# Patient Record
Sex: Male | Born: 1950
Health system: Southern US, Community
[De-identification: ages and names within clinical notes are randomized; demographics above are authoritative.]

## PROBLEM LIST (undated history)

## (undated) DIAGNOSIS — I10 Essential (primary) hypertension: Secondary | ICD-10-CM

## (undated) DIAGNOSIS — H269 Unspecified cataract: Secondary | ICD-10-CM

## (undated) DIAGNOSIS — M205X2 Other deformities of toe(s) (acquired), left foot: Secondary | ICD-10-CM

## (undated) DIAGNOSIS — I4510 Unspecified right bundle-branch block: Secondary | ICD-10-CM

## (undated) DIAGNOSIS — M14672 Charcot's joint, left ankle and foot: Secondary | ICD-10-CM

## (undated) DIAGNOSIS — M12579 Traumatic arthropathy, unspecified ankle and foot: Secondary | ICD-10-CM

## (undated) DIAGNOSIS — M14679 Charcot's joint, unspecified ankle and foot: Secondary | ICD-10-CM

## (undated) DIAGNOSIS — Z973 Presence of spectacles and contact lenses: Secondary | ICD-10-CM

## (undated) DIAGNOSIS — E119 Type 2 diabetes mellitus without complications: Secondary | ICD-10-CM

## (undated) HISTORY — PX: TOTAL ANKLE REPLACEMENT: SUR1218

## (undated) HISTORY — DX: Other deformities of toe(s) (acquired), left foot: M20.5X2

## (undated) HISTORY — DX: Essential (primary) hypertension: I10

## (undated) HISTORY — PX: TONSILLECTOMY: SUR1361

## (undated) HISTORY — DX: Type 2 diabetes mellitus without complications: E11.9

## (undated) HISTORY — DX: Traumatic arthropathy, unspecified ankle and foot: M12.579

## (undated) HISTORY — DX: Charcot's joint, unspecified ankle and foot: M14.679

## (undated) HISTORY — PX: COLONOSCOPY: SHX174

---
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
## 2009-05-28 ENCOUNTER — Ambulatory Visit: Payer: Self-pay | Admitting: Internal Medicine

## 2009-06-04 ENCOUNTER — Ambulatory Visit: Payer: Self-pay | Admitting: Internal Medicine

## 2009-06-11 ENCOUNTER — Ambulatory Visit: Payer: Self-pay | Admitting: Internal Medicine

## 2009-06-18 ENCOUNTER — Ambulatory Visit: Payer: Self-pay | Admitting: Internal Medicine

## 2009-06-25 ENCOUNTER — Ambulatory Visit: Payer: Self-pay | Admitting: Internal Medicine

## 2009-07-02 ENCOUNTER — Ambulatory Visit: Payer: Self-pay | Admitting: Internal Medicine

## 2014-12-17 DIAGNOSIS — Z0000 Encounter for general adult medical examination without abnormal findings: Secondary | ICD-10-CM | POA: Diagnosis not present

## 2014-12-17 DIAGNOSIS — Z23 Encounter for immunization: Secondary | ICD-10-CM | POA: Diagnosis not present

## 2014-12-17 DIAGNOSIS — I10 Essential (primary) hypertension: Secondary | ICD-10-CM | POA: Diagnosis not present

## 2014-12-17 DIAGNOSIS — Z125 Encounter for screening for malignant neoplasm of prostate: Secondary | ICD-10-CM | POA: Diagnosis not present

## 2014-12-17 DIAGNOSIS — E119 Type 2 diabetes mellitus without complications: Secondary | ICD-10-CM | POA: Diagnosis not present

## 2014-12-23 ENCOUNTER — Other Ambulatory Visit: Payer: Self-pay | Admitting: Family Medicine

## 2014-12-23 DIAGNOSIS — Z0000 Encounter for general adult medical examination without abnormal findings: Secondary | ICD-10-CM

## 2014-12-25 DIAGNOSIS — M19121 Post-traumatic osteoarthritis, right elbow: Secondary | ICD-10-CM | POA: Diagnosis not present

## 2014-12-25 DIAGNOSIS — G5621 Lesion of ulnar nerve, right upper limb: Secondary | ICD-10-CM | POA: Diagnosis not present

## 2014-12-26 ENCOUNTER — Ambulatory Visit: Payer: Self-pay

## 2015-01-15 ENCOUNTER — Ambulatory Visit
Admission: RE | Admit: 2015-01-15 | Discharge: 2015-01-15 | Disposition: A | Discharge status: Home / Self Care | Payer: Medicare Other | Source: Ambulatory Visit | Attending: Family Medicine | Admitting: Family Medicine

## 2015-01-15 DIAGNOSIS — Z0000 Encounter for general adult medical examination without abnormal findings: Secondary | ICD-10-CM

## 2015-01-15 DIAGNOSIS — Z1389 Encounter for screening for other disorder: Secondary | ICD-10-CM | POA: Insufficient documentation

## 2015-01-15 DIAGNOSIS — Z136 Encounter for screening for cardiovascular disorders: Secondary | ICD-10-CM | POA: Diagnosis not present

## 2015-01-17 DIAGNOSIS — D649 Anemia, unspecified: Secondary | ICD-10-CM | POA: Diagnosis not present

## 2015-01-17 DIAGNOSIS — E785 Hyperlipidemia, unspecified: Secondary | ICD-10-CM | POA: Diagnosis not present

## 2015-01-30 ENCOUNTER — Encounter (INDEPENDENT_AMBULATORY_CARE_PROVIDER_SITE_OTHER): Payer: Self-pay

## 2015-01-30 ENCOUNTER — Other Ambulatory Visit: Payer: Medicare Other

## 2015-01-30 DIAGNOSIS — D539 Nutritional anemia, unspecified: Secondary | ICD-10-CM | POA: Diagnosis not present

## 2015-01-30 DIAGNOSIS — D649 Anemia, unspecified: Secondary | ICD-10-CM

## 2015-01-31 LAB — CBC WITH DIFFERENTIAL/PLATELET
Basophils Absolute: 0 10*3/uL (ref 0.0–0.2)
Basos: 1 %
EOS (ABSOLUTE): 0.1 10*3/uL (ref 0.0–0.4)
EOS: 3 %
Hematocrit: 38.8 % (ref 37.5–51.0)
Hemoglobin: 12.7 g/dL (ref 12.6–17.7)
IMMATURE GRANS (ABS): 0 10*3/uL (ref 0.0–0.1)
Immature Granulocytes: 0 %
Lymphocytes Absolute: 1.4 10*3/uL (ref 0.7–3.1)
Lymphs: 26 %
MCH: 30.3 pg (ref 26.6–33.0)
MCHC: 32.7 g/dL (ref 31.5–35.7)
MCV: 93 fL (ref 79–97)
Monocytes Absolute: 0.5 10*3/uL (ref 0.1–0.9)
Monocytes: 10 %
Neutrophils Absolute: 3.3 10*3/uL (ref 1.4–7.0)
Neutrophils: 60 %
PLATELETS: 228 10*3/uL (ref 150–379)
RBC: 4.19 x10E6/uL (ref 4.14–5.80)
RDW: 13.8 % (ref 12.3–15.4)
WBC: 5.4 10*3/uL (ref 3.4–10.8)

## 2015-01-31 LAB — RETICULOCYTES: RETIC CT PCT: 1.7 % (ref 0.6–2.6)

## 2015-01-31 LAB — FERRITIN: FERRITIN: 98 ng/mL (ref 30–400)

## 2015-01-31 LAB — IRON AND TIBC
Iron Saturation: 19 % (ref 15–55)
Iron: 64 ug/dL (ref 38–169)
Total Iron Binding Capacity: 332 ug/dL (ref 250–450)
UIBC: 268 ug/dL (ref 111–343)

## 2015-02-05 ENCOUNTER — Telehealth: Payer: Self-pay

## 2015-02-05 NOTE — Telephone Encounter (Signed)
-----   Message from Steele Sizer, MD sent at 02/04/2015  9:05 AM EDT ----- Call pt about labs

## 2015-06-25 ENCOUNTER — Telehealth: Payer: Self-pay

## 2015-06-25 ENCOUNTER — Other Ambulatory Visit: Payer: Self-pay

## 2015-06-25 MED ORDER — LISINOPRIL 10 MG PO TABS: 40.0000 mg | ORAL_TABLET | Freq: Every day | ORAL | Status: DC

## 2015-06-25 MED ORDER — LISINOPRIL 10 MG PO TABS: 10.0000 mg | ORAL_TABLET | Freq: Every day | ORAL | Status: DC

## 2015-06-25 NOTE — Telephone Encounter (Signed)
LAST VISIT: 02/09/2015 (Lab Visit)  Request for lisinopril 10 mg tab.

## 2015-06-25 NOTE — Telephone Encounter (Signed)
Pt called to check status of refill request. Pt is out of the medication. Can this be sent today. Thanks.

## 2015-06-25 NOTE — Telephone Encounter (Signed)
Fax received from pharmacy for Rx Lisinopril 10mg . The directions state "take 4 tablets (40 mg total) by mouth daily"  Pharmacy notes "Please verify if this is correct. Patient has only been on 1 daily" So, pharmacy needs to know is he taking 1 tablet a day or 4?

## 2015-07-23 DIAGNOSIS — E119 Type 2 diabetes mellitus without complications: Secondary | ICD-10-CM | POA: Insufficient documentation

## 2015-07-23 DIAGNOSIS — I10 Essential (primary) hypertension: Secondary | ICD-10-CM | POA: Insufficient documentation

## 2015-07-28 ENCOUNTER — Ambulatory Visit (INDEPENDENT_AMBULATORY_CARE_PROVIDER_SITE_OTHER): Payer: Medicare Other | Admitting: Family Medicine

## 2015-07-28 ENCOUNTER — Encounter: Payer: Self-pay | Admitting: Family Medicine

## 2015-07-28 VITALS — BP 129/75 | HR 66 | Temp 97.9°F | Ht 74.2 in | Wt 269.0 lb

## 2015-07-28 DIAGNOSIS — D649 Anemia, unspecified: Secondary | ICD-10-CM | POA: Diagnosis not present

## 2015-07-28 DIAGNOSIS — Z23 Encounter for immunization: Secondary | ICD-10-CM | POA: Diagnosis not present

## 2015-07-28 DIAGNOSIS — I10 Essential (primary) hypertension: Secondary | ICD-10-CM | POA: Diagnosis not present

## 2015-07-28 DIAGNOSIS — E119 Type 2 diabetes mellitus without complications: Secondary | ICD-10-CM | POA: Diagnosis not present

## 2015-07-28 LAB — BAYER DCA HB A1C WAIVED: HB A1C: 6 % (ref <7.0)

## 2015-07-28 NOTE — Assessment & Plan Note (Signed)
Will do follow-up labs as recommended last spring

## 2015-07-28 NOTE — Assessment & Plan Note (Deleted)
Discussed better control needed Diet and exercise

## 2015-07-28 NOTE — Assessment & Plan Note (Signed)
The current medical regimen is effective;  continue present plan and medications.  

## 2015-07-28 NOTE — Progress Notes (Signed)
BP 129/75 mmHg  Pulse 66  Temp(Src) 97.9 F (36.6 C)  Ht 6' 2.2" (1.885 m)  Wt 269 lb (122.018 kg)  BMI 34.34 kg/m2  SpO2 96%   Subjective:    Patient ID: Daniel Leon, male    DOB: 07-Dec-1950, 64 y.o.   MRN: [id number]  HPI: Daniel Leon is a 64 y.o. male  Chief Complaint  Patient presents with  . Hypertension   Patient taking lisinopril 10 mg with no issues blood pressure good control Patient's lost some weight from last April On chart review CBC needs to be repeated Will check iron binding capacity and ferritin along with reticulocyte count  Relevant past medical, surgical, family and social history reviewed and updated as indicated. Interim medical history since our last visit reviewed. Allergies and medications reviewed and updated.  Review of Systems  Constitutional: Negative.   Respiratory: Negative.   Cardiovascular: Negative.     Per HPI unless specifically indicated above     Objective:    BP 129/75 mmHg  Pulse 66  Temp(Src) 97.9 F (36.6 C)  Ht 6' 2.2" (1.885 m)  Wt 269 lb (122.018 kg)  BMI 34.34 kg/m2  SpO2 96%  Wt Readings from Last 3 Encounters:  07/28/15 269 lb (122.018 kg)  12/17/14 274 lb (124.286 kg)    Physical Exam  Constitutional: He is oriented to person, place, and time. He appears well-developed and well-nourished. No distress.  HENT:  Head: Normocephalic and atraumatic.  Right Ear: Hearing normal.  Left Ear: Hearing normal.  Nose: Nose normal.  Eyes: Conjunctivae and lids are normal. Right eye exhibits no discharge. Left eye exhibits no discharge. No scleral icterus.  Pulmonary/Chest: Effort normal. No respiratory distress.  Musculoskeletal: Normal range of motion.  Neurological: He is alert and oriented to person, place, and time.  Skin: Skin is intact. No rash noted.  Psychiatric: He has a normal mood and affect. His speech is normal and behavior is normal. Judgment and thought content normal. Cognition and memory  are normal.    Results for orders placed or performed in visit on 01/30/15  CBC with Differential/Platelet  Result Value Ref Range   WBC 5.4 3.4 - 10.8 x10E3/uL   RBC 4.19 4.14 - 5.80 x10E6/uL   Hemoglobin 12.7 12.6 - 17.7 g/dL   Hematocrit 40.9 81.1 - 51.0 %   MCV 93 79 - 97 fL   MCH 30.3 26.6 - 33.0 pg   MCHC 32.7 31.5 - 35.7 g/dL   RDW 91.4 78.2 - 95.6 %   Platelets 228 150 - 379 x10E3/uL   Neutrophils 60 %   Lymphs 26 %   Monocytes 10 %   Eos 3 %   Basos 1 %   Neutrophils Absolute 3.3 1.4 - 7.0 x10E3/uL   Lymphocytes Absolute 1.4 0.7 - 3.1 x10E3/uL   Monocytes Absolute 0.5 0.1 - 0.9 x10E3/uL   EOS (ABSOLUTE) 0.1 0.0 - 0.4 x10E3/uL   Basophils Absolute 0.0 0.0 - 0.2 x10E3/uL   Immature Granulocytes 0 %   Immature Grans (Abs) 0.0 0.0 - 0.1 x10E3/uL  Ferritin  Result Value Ref Range   Ferritin 98 30 - 400 ng/mL  Iron Binding Cap (TIBC)  Result Value Ref Range   Total Iron Binding Capacity 332 250 - 450 ug/dL   UIBC 213 086 - 578 ug/dL   Iron 64 38 - 469 ug/dL   Iron Saturation 19 15 - 55 %  Retic  Result Value Ref Range  Retic Ct Pct 1.7 0.6 - 2.6 %      Assessment & Plan:   Problem List Items Addressed This Visit      Cardiovascular and Mediastinum   Essential hypertension    The current medical regimen is effective;  continue present plan and medications.       Relevant Orders   Basic metabolic panel     Endocrine   Diabetes mellitus without complication (HCC)    Diet controlled no symptoms      Relevant Orders   Bayer DCA Hb A1c Waived   Basic metabolic panel     Other   Anemia    Will do follow-up labs as recommended last spring      Relevant Orders   CBC with Differential/Platelet   Ferritin   Reticulocytes   Iron and TIBC    Other Visit Diagnoses    Immunization due    -  Primary    Relevant Orders    Flu Vaccine QUAD 36+ mos PF IM (Fluarix & Fluzone Quad PF) (Completed)        Follow up plan: Return in about 3 months (around  10/26/2015) for Physical Exam   April A1C.

## 2015-07-28 NOTE — Assessment & Plan Note (Signed)
Diet controlled- no symptoms  

## 2015-07-29 LAB — FERRITIN: Ferritin: 78 ng/mL (ref 30–400)

## 2015-07-29 LAB — BASIC METABOLIC PANEL
BUN/Creatinine Ratio: 20 (ref 10–22)
BUN: 22 mg/dL (ref 8–27)
CO2: 25 mmol/L (ref 18–29)
CREATININE: 1.08 mg/dL (ref 0.76–1.27)
Calcium: 9.4 mg/dL (ref 8.6–10.2)
Chloride: 99 mmol/L (ref 97–106)
GFR calc Af Amer: 83 mL/min/{1.73_m2} (ref >59)
GFR, EST NON AFRICAN AMERICAN: 72 mL/min/{1.73_m2} (ref >59)
Glucose: 95 mg/dL (ref 65–99)
Potassium: 5.1 mmol/L (ref 3.5–5.2)
SODIUM: 139 mmol/L (ref 136–144)

## 2015-07-29 LAB — CBC WITH DIFFERENTIAL/PLATELET
BASOS: 1 %
Basophils Absolute: 0.1 10*3/uL (ref 0.0–0.2)
EOS (ABSOLUTE): 0.2 10*3/uL (ref 0.0–0.4)
EOS: 3 %
HEMATOCRIT: 37.4 % — AB (ref 37.5–51.0)
HEMOGLOBIN: 12.9 g/dL (ref 12.6–17.7)
Immature Grans (Abs): 0 10*3/uL (ref 0.0–0.1)
Immature Granulocytes: 1 %
LYMPHS ABS: 1.7 10*3/uL (ref 0.7–3.1)
Lymphs: 26 %
MCH: 30.7 pg (ref 26.6–33.0)
MCHC: 34.5 g/dL (ref 31.5–35.7)
MCV: 89 fL (ref 79–97)
MONOCYTES: 11 %
Monocytes Absolute: 0.7 10*3/uL (ref 0.1–0.9)
NEUTROS ABS: 3.8 10*3/uL (ref 1.4–7.0)
Neutrophils: 58 %
Platelets: 298 10*3/uL (ref 150–379)
RBC: 4.2 x10E6/uL (ref 4.14–5.80)
RDW: 13.6 % (ref 12.3–15.4)
WBC: 6.6 10*3/uL (ref 3.4–10.8)

## 2015-07-29 LAB — RETICULOCYTES: RETIC CT PCT: 1.8 % (ref 0.6–2.6)

## 2015-07-29 LAB — IRON AND TIBC
IRON SATURATION: 20 % (ref 15–55)
IRON: 68 ug/dL (ref 38–169)
TIBC: 345 ug/dL (ref 250–450)
UIBC: 277 ug/dL (ref 111–343)

## 2015-08-29 ENCOUNTER — Other Ambulatory Visit: Payer: BC Managed Care – PPO

## 2015-08-29 ENCOUNTER — Encounter: Payer: Self-pay | Admitting: *Deleted

## 2015-08-29 NOTE — Patient Instructions (Signed)
  Your procedure is scheduled on: 09-05-15 (FRIDAY) Report to [facility] 2ND FLOOR To find out your arrival time please call [phone] between 1PM - 3PM on 09-04-15 (THURSDAY)  Remember: Instructions that are not followed completely may result in serious medical risk, up to and including death, or upon the discretion of your surgeon and anesthesiologist your surgery may need to be rescheduled.    _X___ 1. Do not eat food or drink liquids after midnight. No gum chewing or hard candies.     _X___ 2. No Alcohol for 24 hours before or after surgery.   ____ 3. Bring all medications with you on the day of surgery if instructed.    _X___ 4. Notify your doctor if there is any change in your medical condition     (cold, fever, infections).     Do not wear jewelry, make-up, hairpins, clips or nail polish.  Do not wear lotions, powders, or perfumes. You may wear deodorant.  Do not shave 48 hours prior to surgery. Men may shave face and neck.  Do not bring valuables to the hospital.    [facility] is not responsible for any belongings or valuables.               Contacts, dentures or bridgework may not be worn into surgery.  Leave your suitcase in the car. After surgery it may be brought to your room.  For patients admitted to the hospital, discharge time is determined by your treatment team.   Patients discharged the day of surgery will not be allowed to drive home.   Please read over the following fact sheets that you were given:    _X___ Take these medicines the morning of surgery with A SIP OF WATER:    1. LISINOPRIL  2.   3.   4.  5.  6.  ____ Fleet Enema (as directed)   _X___ Use CHG Soap as directed  ____ Use inhalers on the day of surgery  ____ Stop metformin 2 days prior to surgery    ____ Take 1/2 of usual insulin dose the night before surgery and none on the morning of surgery.   ____ Stop Coumadin/Plavix/aspirin-N/A  ____ Stop  Anti-inflammatories-NO NSAIDS OR ASPIRIN PRODUCTS-TYLENOL OK   ____ Stop supplements until after surgery.    ____ Bring C-Pap to the hospital.

## 2015-09-02 ENCOUNTER — Other Ambulatory Visit: Payer: Self-pay

## 2015-09-02 ENCOUNTER — Encounter
Admission: RE | Admit: 2015-09-02 | Discharge: 2015-09-02 | Disposition: A | Discharge status: Home / Self Care | Payer: Medicare Other | Source: Ambulatory Visit | Attending: Specialist | Admitting: Specialist

## 2015-09-02 DIAGNOSIS — G5621 Lesion of ulnar nerve, right upper limb: Secondary | ICD-10-CM | POA: Diagnosis not present

## 2015-09-04 ENCOUNTER — Encounter: Payer: Self-pay | Admitting: *Deleted

## 2015-09-04 MED ORDER — NEOMYCIN-POLYMYXIN B GU 40-200000 IR SOLN
Status: AC
  Filled 2015-09-04: qty 2

## 2015-09-04 MED ORDER — BUPIVACAINE-EPINEPHRINE (PF) 0.25% -1:200000 IJ SOLN
INTRAMUSCULAR | Status: AC
  Filled 2015-09-04: qty 30

## 2015-09-05 ENCOUNTER — Ambulatory Visit: Payer: Medicare Other | Admitting: Anesthesiology

## 2015-09-05 ENCOUNTER — Encounter
Admission: RE | Disposition: A | Discharge status: Home / Self Care | Payer: Self-pay | Source: Ambulatory Visit | Attending: Specialist

## 2015-09-05 ENCOUNTER — Ambulatory Visit
Admission: RE | Admit: 2015-09-05 | Discharge: 2015-09-05 | Disposition: A | Discharge status: Home / Self Care | Payer: Medicare Other | Source: Ambulatory Visit | Attending: Specialist | Admitting: Specialist

## 2015-09-05 ENCOUNTER — Encounter: Payer: Self-pay | Admitting: *Deleted

## 2015-09-05 DIAGNOSIS — E119 Type 2 diabetes mellitus without complications: Secondary | ICD-10-CM | POA: Insufficient documentation

## 2015-09-05 DIAGNOSIS — I10 Essential (primary) hypertension: Secondary | ICD-10-CM | POA: Insufficient documentation

## 2015-09-05 DIAGNOSIS — Z87891 Personal history of nicotine dependence: Secondary | ICD-10-CM | POA: Insufficient documentation

## 2015-09-05 DIAGNOSIS — G5621 Lesion of ulnar nerve, right upper limb: Secondary | ICD-10-CM | POA: Insufficient documentation

## 2015-09-05 HISTORY — DX: Unspecified right bundle-branch block: I45.10

## 2015-09-05 HISTORY — PX: ULNAR NERVE TRANSPOSITION: SHX2595

## 2015-09-05 LAB — GLUCOSE, CAPILLARY
GLUCOSE-CAPILLARY: 116 mg/dL — AB (ref 65–99)
Glucose-Capillary: 103 mg/dL — ABNORMAL HIGH (ref 65–99)

## 2015-09-05 SURGERY — ULNAR NERVE DECOMPRESSION/TRANSPOSITION
Anesthesia: General | Site: Elbow | Laterality: Right | Wound class: Clean

## 2015-09-05 MED ORDER — KETOROLAC TROMETHAMINE 30 MG/ML IJ SOLN
INTRAMUSCULAR | Status: DC | PRN
  Administered 2015-09-05: 30 mg via INTRAVENOUS

## 2015-09-05 MED ORDER — FENTANYL CITRATE (PF) 100 MCG/2ML IJ SOLN
INTRAMUSCULAR | Status: DC | PRN
  Administered 2015-09-05 (×2): 50 ug via INTRAVENOUS

## 2015-09-05 MED ORDER — EPHEDRINE SULFATE 50 MG/ML IJ SOLN
INTRAMUSCULAR | Status: DC | PRN
  Administered 2015-09-05: 10 mg via INTRAVENOUS

## 2015-09-05 MED ORDER — CEFAZOLIN SODIUM-DEXTROSE 2-3 GM-% IV SOLR
2.0000 g | Freq: Once | INTRAVENOUS | Status: AC
  Administered 2015-09-05: 2 g via INTRAVENOUS

## 2015-09-05 MED ORDER — CEFAZOLIN SODIUM-DEXTROSE 2-3 GM-% IV SOLR
INTRAVENOUS | Status: AC
  Filled 2015-09-05: qty 50

## 2015-09-05 MED ORDER — BUPIVACAINE HCL (PF) 0.5 % IJ SOLN
INTRAMUSCULAR | Status: AC
  Filled 2015-09-05: qty 30

## 2015-09-05 MED ORDER — ACETAMINOPHEN 10 MG/ML IV SOLN
INTRAVENOUS | Status: AC
  Filled 2015-09-05: qty 100

## 2015-09-05 MED ORDER — ONDANSETRON HCL 4 MG/2ML IJ SOLN: 4.0000 mg | Freq: Once | INTRAMUSCULAR | Status: DC | PRN

## 2015-09-05 MED ORDER — LIDOCAINE HCL (CARDIAC) 20 MG/ML IV SOLN
INTRAVENOUS | Status: DC | PRN
  Administered 2015-09-05: 100 mg via INTRAVENOUS

## 2015-09-05 MED ORDER — BUPIVACAINE HCL (PF) 0.5 % IJ SOLN
INTRAMUSCULAR | Status: DC | PRN
  Administered 2015-09-05: 10 mL

## 2015-09-05 MED ORDER — ACETAMINOPHEN 10 MG/ML IV SOLN
INTRAVENOUS | Status: DC | PRN
  Administered 2015-09-05: 300 mg via INTRAVENOUS
  Administered 2015-09-05: 1000 mg via INTRAVENOUS

## 2015-09-05 MED ORDER — FAMOTIDINE 20 MG PO TABS
20.0000 mg | ORAL_TABLET | Freq: Once | ORAL | Status: AC
  Administered 2015-09-05: 20 mg via ORAL

## 2015-09-05 MED ORDER — HYDROCODONE-ACETAMINOPHEN 7.5-325 MG PO TABS
ORAL_TABLET | ORAL | Status: AC
  Administered 2015-09-05: 1 via ORAL
  Filled 2015-09-05: qty 1

## 2015-09-05 MED ORDER — LIDOCAINE HCL 2 % EX GEL
CUTANEOUS | Status: DC | PRN
  Administered 2015-09-05: 1 via TOPICAL

## 2015-09-05 MED ORDER — ONDANSETRON HCL 4 MG/2ML IJ SOLN
INTRAMUSCULAR | Status: DC | PRN
  Administered 2015-09-05: 4 mg via INTRAVENOUS

## 2015-09-05 MED ORDER — SODIUM CHLORIDE 0.9 % IV SOLN
INTRAVENOUS | Status: DC
  Administered 2015-09-05: 07:00:00 via INTRAVENOUS

## 2015-09-05 MED ORDER — FENTANYL CITRATE (PF) 100 MCG/2ML IJ SOLN
25.0000 ug | INTRAMUSCULAR | Status: DC | PRN
  Administered 2015-09-05 (×2): 25 ug via INTRAVENOUS

## 2015-09-05 MED ORDER — MIDAZOLAM HCL 2 MG/2ML IJ SOLN
INTRAMUSCULAR | Status: DC | PRN
  Administered 2015-09-05: 2 mg via INTRAVENOUS

## 2015-09-05 MED ORDER — FENTANYL CITRATE (PF) 100 MCG/2ML IJ SOLN
INTRAMUSCULAR | Status: AC
  Administered 2015-09-05: 25 ug via INTRAVENOUS
  Filled 2015-09-05: qty 2

## 2015-09-05 MED ORDER — PROPOFOL 10 MG/ML IV BOLUS
INTRAVENOUS | Status: DC | PRN
  Administered 2015-09-05: 280 mg via INTRAVENOUS
  Administered 2015-09-05: 20 mg via INTRAVENOUS

## 2015-09-05 MED ORDER — HYDROCODONE-ACETAMINOPHEN 7.5-325 MG PO TABS
1.0000 | ORAL_TABLET | Freq: Four times a day (QID) | ORAL | Status: DC | PRN
  Administered 2015-09-05: 1 via ORAL
  Filled 2015-09-05: qty 1

## 2015-09-05 MED ORDER — HYDROCODONE-ACETAMINOPHEN 7.5-325 MG PO TABS: 1.0000 | ORAL_TABLET | Freq: Four times a day (QID) | ORAL | Status: DC | PRN

## 2015-09-05 MED ORDER — PHENYLEPHRINE HCL 10 MG/ML IJ SOLN
INTRAMUSCULAR | Status: DC | PRN
  Administered 2015-09-05 (×4): 100 ug via INTRAVENOUS

## 2015-09-05 MED ORDER — FAMOTIDINE 20 MG PO TABS
ORAL_TABLET | ORAL | Status: AC
  Filled 2015-09-05: qty 1

## 2015-09-05 MED ORDER — CHLORHEXIDINE GLUCONATE 4 % EX LIQD: Freq: Once | CUTANEOUS | Status: DC

## 2015-09-05 MED ORDER — GLYCOPYRROLATE 0.2 MG/ML IJ SOLN
INTRAMUSCULAR | Status: DC | PRN
  Administered 2015-09-05: 0.2 mg via INTRAVENOUS

## 2015-09-05 SURGICAL SUPPLY — 39 items
BANDAGE ELASTIC 4 LF NS (GAUZE/BANDAGES/DRESSINGS) ×3 IMPLANT
BNDG COHESIVE 4X5 TAN STRL (GAUZE/BANDAGES/DRESSINGS) ×3 IMPLANT
BNDG ESMARK 4X12 TAN STRL LF (GAUZE/BANDAGES/DRESSINGS) ×3 IMPLANT
CANISTER SUCT 1200ML W/VALVE (MISCELLANEOUS) ×3 IMPLANT
CHLORAPREP W/TINT 26ML (MISCELLANEOUS) ×3 IMPLANT
CLOSURE WOUND 1/2 X4 (GAUZE/BANDAGES/DRESSINGS) ×1
CUFF TOURN 18 STER (MISCELLANEOUS) ×3 IMPLANT
DRAIN PENROSE 1/4X12 LTX (DRAIN) ×3 IMPLANT
GAUZE PETRO XEROFOAM 1X8 (MISCELLANEOUS) ×3 IMPLANT
GAUZE SPONGE 4X4 12PLY STRL (GAUZE/BANDAGES/DRESSINGS) ×3 IMPLANT
GLOVE BIO SURGEON STRL SZ8 (GLOVE) ×3 IMPLANT
GOWN STRL REUS W/ TWL LRG LVL3 (GOWN DISPOSABLE) ×3 IMPLANT
GOWN STRL REUS W/TWL LRG LVL3 (GOWN DISPOSABLE) ×6
KIT RM TURNOVER STRD PROC AR (KITS) ×3 IMPLANT
NDL MAYO CATGUT SZ5 (NEEDLE)
NDL SUT 5 .5 CRC TROC PNT MAYO (NEEDLE) IMPLANT
NS IRRIG 1000ML POUR BTL (IV SOLUTION) ×3 IMPLANT
PACK EXTREMITY ARMC (MISCELLANEOUS) ×3 IMPLANT
PAD CAST CTTN 4X4 STRL (SOFTGOODS) IMPLANT
PAD GROUND ADULT SPLIT (MISCELLANEOUS) ×3 IMPLANT
PADDING CAST 4IN STRL (MISCELLANEOUS) ×4
PADDING CAST BLEND 4X4 STRL (MISCELLANEOUS) ×2 IMPLANT
PADDING CAST COTTON 4X4 STRL (SOFTGOODS)
SLING ARM LRG DEEP (SOFTGOODS) ×3 IMPLANT
SPLINT CAST 1 STEP 4X30 (MISCELLANEOUS) IMPLANT
SPONGE LAP 18X18 5 PK (GAUZE/BANDAGES/DRESSINGS) ×3 IMPLANT
STAPLER SKIN PROX 35W (STAPLE) ×3 IMPLANT
STOCKINETTE IMPERVIOUS 9X36 MD (GAUZE/BANDAGES/DRESSINGS) ×3 IMPLANT
STRIP CLOSURE SKIN 1/2X4 (GAUZE/BANDAGES/DRESSINGS) ×2 IMPLANT
SUT ETHILON 4-0 (SUTURE)
SUT ETHILON 4-0 FS2 18XMFL BLK (SUTURE)
SUT MERSILENE 4-0 WHT RB-1 (SUTURE) IMPLANT
SUT PROLENE 3 0 PS 2 (SUTURE) IMPLANT
SUT VIC AB 3-0 SH 27 (SUTURE) ×4
SUT VIC AB 3-0 SH 27X BRD (SUTURE) ×2 IMPLANT
SUTURE ETHLN 4-0 FS2 18XMF BLK (SUTURE) IMPLANT
WIRE Z .035 C-WIRE SPADE TIP (WIRE) IMPLANT
WIRE Z .045 C-WIRE SPADE TIP (WIRE) IMPLANT
WIRE Z .062 C-WIRE SPADE TIP (WIRE) IMPLANT

## 2015-09-05 NOTE — Brief Op Note (Signed)
09/05/2015  9:06 AM  PATIENT:  Daniel Leon  65 y.o. male  PRE-OPERATIVE DIAGNOSIS:  LESION OF ULNAR NERVE right elbow  POST-OPERATIVE DIAGNOSIS:  cubital tunnel syndrome right arm, post traumatic degenerative arthritis left elbow  PROCEDURE:  Procedure(s): ULNAR NERVE DECOMPRESSION/TRANSPOSITION (Right)  SURGEON:  Surgeon(s) and Role:    * Myra Rudehristopher Clif Serio, MD - Primary  PHYSICIAN ASSISTANT:   ASSISTANTS: Jamison NeighborElizabeth MacArthur, PA-S   ANESTHESIA:   general  EBL:  Total I/O In: 1000 [I.V.:1000] Out: 0   BLOOD ADMINISTERED:none  DRAINS: none   LOCAL MEDICATIONS USED:  MARCAINE     SPECIMEN:  No Specimen  DISPOSITION OF SPECIMEN:  N/A  COUNTS:  YES  TOURNIQUET:    DICTATION: .Other Dictation: Dictation Number [id number]  PLAN OF CARE: Discharge to home after PACU  PATIENT DISPOSITION:  PACU - hemodynamically stable.   Delay start of Pharmacological VTE agent (>24hrs) due to surgical blood loss or risk of bleeding: not applicable

## 2015-09-05 NOTE — Anesthesia Postprocedure Evaluation (Signed)
Anesthesia Post Note  Patient: Daniel Leon  Procedure(s) Performed: Procedure(s) (LRB): ULNAR NERVE DECOMPRESSION/TRANSPOSITION (Right)  Patient location during evaluation: PACU Anesthesia Type: General Level of consciousness: awake and alert Pain management: pain level controlled Vital Signs Assessment: post-procedure vital signs reviewed and stable Respiratory status: spontaneous breathing and respiratory function stable Cardiovascular status: stable Anesthetic complications: no    Last Vitals:  Filed Vitals:   09/05/15 0853 09/05/15 0901  BP: 118/70   Pulse: 77 71  Temp: 36.8 C   Resp: 14 13    Last Pain: There were no vitals filed for this visit.               Shahzad Thomann K

## 2015-09-05 NOTE — Transfer of Care (Signed)
Immediate Anesthesia Transfer of Care Note  Patient: Daniel Leon  Procedure(s) Performed: Procedure(s): ULNAR NERVE DECOMPRESSION/TRANSPOSITION (Right)  Patient Location: PACU  Anesthesia Type:General  Level of Consciousness: sedated  Airway & Oxygen Therapy: Patient Spontanous Breathing and Patient connected to nasal cannula oxygen  Post-op Assessment: Report given to RN and Post -op Vital signs reviewed and stable  Post vital signs: Reviewed and stable  Last Vitals:  Filed Vitals:   09/05/15 0613  BP: 141/74  Pulse: 74  Temp: 36.7 C  Resp: 16    Complications: No apparent anesthesia complications

## 2015-09-05 NOTE — Discharge Instructions (Addendum)
May remove sling prn OK to use right hand for light activities Keep right elbow dressing clean and dryGeneral Anesthesia, Adult, Care After Refer to this sheet in the next few weeks. These instructions provide you with information on caring for yourself after your procedure. Your health care provider may also give you more specific instructions. Your treatment has been planned according to current medical practices, but problems sometimes occur. Call your health care provider if you have any problems or questions after your procedure. WHAT TO EXPECT AFTER THE PROCEDURE After the procedure, it is typical to experience:  Sleepiness.  Nausea and vomiting. HOME CARE INSTRUCTIONS  For the first 24 hours after general anesthesia:  Have a responsible person with you.  Do not drive a car. If you are alone, do not take public transportation.  Do not drink alcohol.  Do not take medicine that has not been prescribed by your health care provider.  Do not sign important papers or make important decisions.  You may resume a normal diet and activities as directed by your health care provider.  Change bandages (dressings) as directed.  If you have questions or problems that seem related to general anesthesia, call the hospital and ask for the anesthetist or anesthesiologist on call. SEEK MEDICAL CARE IF:  You have nausea and vomiting that continue the day after anesthesia.  You develop a rash. SEEK IMMEDIATE MEDICAL CARE IF:   You have difficulty breathing.  You have chest pain.  You have any allergic problems.   This information is not intended to replace advice given to you by your health care provider. Make sure you discuss any questions you have with your health care provider.   Document Released: 11/15/2000 Document Revised: 08/30/2014 Document Reviewed: 12/08/2011 Elsevier Interactive Patient Education 2016 [employer] AMBULATORY SURGERY  DISCHARGE INSTRUCTIONS   1) The  drugs that you were given will stay in your system until tomorrow so for the next 24 hours you should not:  A) Drive an automobile B) Make any legal decisions C) Drink any alcoholic beverage   2) You may resume regular meals tomorrow.  Today it is better to start with liquids and gradually work up to solid foods.  You may eat anything you prefer, but it is better to start with liquids, then soup and crackers, and gradually work up to solid foods.   3) Please notify your doctor immediately if you have any unusual bleeding, trouble breathing, redness and pain at the surgery site, drainage, fever, or pain not relieved by medication.    4) Additional Instructions:        Please contact your physician with any problems or Same Day Surgery at [phone], Monday through Friday 6 am to 4 pm, or [place] at [facility] Main number at [phone].

## 2015-09-05 NOTE — OR Nursing (Signed)
Patient states pain is improving and desires discharge at this time.

## 2015-09-05 NOTE — OR Nursing (Signed)
Patient able to move right hand and fingers. Right extremity warm and cap refill <3 secs.

## 2015-09-05 NOTE — Anesthesia Preprocedure Evaluation (Addendum)
Anesthesia Evaluation  Patient identified by MRN, date of birth, ID band Patient awake    Reviewed: Allergy & Precautions, H&P , NPO status , Patient's Chart, lab work & pertinent test results, reviewed documented beta blocker date and time   Airway Mallampati: II  TM Distance: >3 FB Neck ROM: full    Dental  (+) Teeth Intact   Pulmonary neg pulmonary ROS, former smoker,    Pulmonary exam normal        Cardiovascular Exercise Tolerance: Good hypertension, negative cardio ROS Normal cardiovascular exam+ dysrhythmias  Rate:Normal     Neuro/Psych negative neurological ROS  negative psych ROS   GI/Hepatic negative GI ROS, Neg liver ROS,   Endo/Other  negative endocrine ROSdiabetes  Renal/GU negative Renal ROS  negative genitourinary   Musculoskeletal   Abdominal   Peds  Hematology negative hematology ROS (+) anemia ,   Anesthesia Other Findings   Reproductive/Obstetrics negative OB ROS                             Anesthesia Physical Anesthesia Plan  ASA: II  Anesthesia Plan: General LMA   Post-op Pain Management:    Induction:   Airway Management Planned:   Additional Equipment:   Intra-op Plan:   Post-operative Plan:   Informed Consent: I have reviewed the patients History and Physical, chart, labs and discussed the procedure including the risks, benefits and alternatives for the proposed anesthesia with the patient or authorized representative who has indicated his/her understanding and acceptance.   Dental Advisory Given  Plan Discussed with: CRNA  Anesthesia Plan Comments:         Anesthesia Quick Evaluation

## 2015-09-05 NOTE — H&P (Signed)
  [age] year old male with cubital tunnel syndrome right elbow.  History and physical exam has been entered into the record in the form of a paper document.  Heart and lungs clear.  ENT normal.  Plan: ulnar nerve release right elbow with anterior transposition.

## 2015-09-05 NOTE — Anesthesia Procedure Notes (Signed)
Procedure Name: LMA Insertion Date/Time: 09/05/2015 7:33 AM Performed by: Stormy FabianURTIS, Sion Thane Pre-anesthesia Checklist: Patient identified, Patient being monitored, Timeout performed, Emergency Drugs available and Suction available Patient Re-evaluated:Patient Re-evaluated prior to inductionOxygen Delivery Method: Circle system utilized Preoxygenation: Pre-oxygenation with 100% oxygen Intubation Type: IV induction Ventilation: Mask ventilation without difficulty LMA: LMA inserted LMA Size: 5.0 Tube type: Oral Number of attempts: 1 Placement Confirmation: positive ETCO2 and breath sounds checked- equal and bilateral Tube secured with: Tape Dental Injury: Teeth and Oropharynx as per pre-operative assessment

## 2015-09-08 NOTE — Op Note (Signed)
NAMJanell Quiet:  Dejoy, Jarman            ACCOUNT NO.:  [id number]  MEDICAL RECORD NO.:  [id number]  LOCATION:                                 FACILITY:  PHYSICIAN:  Reita Chardhris Rhodes Calvert, MD        DATE OF BIRTH:  August 22, 1951  DATE OF PROCEDURE:  09/05/2015 DATE OF DISCHARGE:                              OPERATIVE REPORT   PREOPERATIVE DIAGNOSIS:  Cubital tunnel syndrome, right elbow.  POSTOPERATIVE DIAGNOSIS:  Cubital tunnel syndrome, right elbow.  PROCEDURE PERFORMED:  Ulnar nerve release right elbow with anterior subcutaneous transposition.  SURGEON:  Reita Chardhris Rossy Virag, MD  SURGEON:  Reita Chardhris Ballard Budney, MD  ASSISTANT:  Erma HeritageElizabeth MacArthuer, PA-S  ANESTHESIA:  General.  COMPLICATIONS:  None.  TOURNIQUET TIME:  15 minutes.  DESCRIPTION OF PROCEDURE:  After adequate induction of general anesthesia, the right upper extremity was thoroughly prepped with alcohol and ChloraPrep and draped in standard sterile fashion.  The right upper extremity was wrapped out with the Esmarch bandage and pneumatic tourniquet elevated to 250 mmHg.  Sterile tourniquet was used. Under loupe magnification, curved incision was made medially over the prominence of the medial epicondyle.  The dissection was carefully carried down through the subcutaneous tissue and the ulnar nerve was identified directly posterior to the medial epicondyle.  There was seen to be severe entrapment of the nerve at this point.  The dissection was then carried proximally and the nerve completely dissected out, and then the dissection was carried distally.  There was seen to be bulbous enlargement of the nerve at the point where the impingement is and rather atrophic nerve both proximally and distally.  A fascial flap was then formed on the anterior surface of the medial epicondyle.  The ulnar nerve was transferred anteriorly and then secured with several 3-0 Vicryl sutures through the fascial flap.  Careful check was made.  There is no impingement  on the nerve by the fascial flaps in both extension and flexion.  The wound was thoroughly irrigated multiple times.  Subcutaneous tissue was closed with 3-0 Vicryl, skin was closed with the skin stapler.  Soft bulky dressing was applied.  Tourniquet was released.  The patient was returned to the recovery room in satisfactory condition having tolerated procedure quite well.          ______________________________ Reita Chardhris Rosalie Buenaventura, MD     CS/MEDQ  D:  09/06/2015  T:  09/07/2015  Job:  [id number]

## 2015-12-24 ENCOUNTER — Ambulatory Visit (INDEPENDENT_AMBULATORY_CARE_PROVIDER_SITE_OTHER): Payer: Medicare Other | Admitting: Family Medicine

## 2015-12-24 ENCOUNTER — Encounter: Payer: Self-pay | Admitting: Family Medicine

## 2015-12-24 VITALS — BP 117/72 | HR 75 | Temp 98.1°F | Ht 75.0 in | Wt 263.0 lb

## 2015-12-24 DIAGNOSIS — I10 Essential (primary) hypertension: Secondary | ICD-10-CM | POA: Diagnosis not present

## 2015-12-24 DIAGNOSIS — E119 Type 2 diabetes mellitus without complications: Secondary | ICD-10-CM

## 2015-12-24 DIAGNOSIS — Z0000 Encounter for general adult medical examination without abnormal findings: Secondary | ICD-10-CM

## 2015-12-24 LAB — URINALYSIS, ROUTINE W REFLEX MICROSCOPIC
Bilirubin, UA: NEGATIVE
Glucose, UA: NEGATIVE
Ketones, UA: NEGATIVE
LEUKOCYTES UA: NEGATIVE
Nitrite, UA: NEGATIVE
PH UA: 5.5 (ref 5.0–7.5)
SPEC GRAV UA: 1.02 (ref 1.005–1.030)
Urobilinogen, Ur: 0.2 mg/dL (ref 0.2–1.0)

## 2015-12-24 LAB — MICROSCOPIC EXAMINATION
BACTERIA UA: NONE SEEN
EPITHELIAL CELLS (NON RENAL): NONE SEEN /HPF (ref 0–10)

## 2015-12-24 LAB — BAYER DCA HB A1C WAIVED: HB A1C: 5.9 % (ref <7.0)

## 2015-12-24 NOTE — Assessment & Plan Note (Signed)
Diet controled 

## 2015-12-24 NOTE — Progress Notes (Signed)
BP 117/72 mmHg  Pulse 75  Temp(Src) 98.1 F (36.7 C)  Ht  (1.905 m)  Wt 263 lb (119.296 kg)  BMI 32.87 kg/m2  SpO2 97%   Subjective:    Patient ID: Daniel Leon, male    DOB: [birth year], 65 y.o.   MRN: [id number]  HPI: Daniel Leon is a 65 y.o. male  Chief Complaint  Patient presents with  . Annual Exam  Patient all in all doing well is exercising now on a regular basis not taking any diabetes medicines nor having any diabetes symptoms Taking lisinopril without problems and taking faithfully Has some concerns with had several tick bites no stereotypical rash though ready said area of bite has persisted longer than usual.  Relevant past medical, surgical, family and social history reviewed and updated as indicated. Interim medical history since our last visit reviewed. Allergies and medications reviewed and updated.  Review of Systems  Constitutional: Negative.   HENT: Negative.   Eyes: Negative.   Respiratory: Negative.   Cardiovascular: Negative.   Gastrointestinal: Negative.   Endocrine: Negative.   Genitourinary: Negative.   Musculoskeletal: Negative.   Skin: Negative.   Allergic/Immunologic: Negative.   Neurological: Negative.   Hematological: Negative.   Psychiatric/Behavioral: Negative.     Per HPI unless specifically indicated above     Objective:    BP 117/72 mmHg  Pulse 75  Temp(Src) 98.1 F (36.7 C)  Ht  (1.905 m)  Wt 263 lb (119.296 kg)  BMI 32.87 kg/m2  SpO2 97%  Wt Readings from Last 3 Encounters:  12/24/15 263 lb (119.296 kg)  08/29/15 260 lb (117.935 kg)  07/28/15 269 lb (122.018 kg)    Physical Exam  Constitutional: He is oriented to person, place, and time. He appears well-developed and well-nourished.  HENT:  Head: Normocephalic and atraumatic.  Right Ear: External ear normal.  Left Ear: External ear normal.  Eyes: Conjunctivae and EOM are normal. Pupils are equal, round, and reactive to light.  Neck: Normal  range of motion. Neck supple.  Cardiovascular: Normal rate, regular rhythm, normal heart sounds and intact distal pulses.   Pulmonary/Chest: Effort normal and breath sounds normal.  Abdominal: Soft. Bowel sounds are normal. There is no splenomegaly or hepatomegaly.  Genitourinary: Rectum normal, prostate normal and penis normal.  Musculoskeletal: Normal range of motion.  Neurological: He is alert and oriented to person, place, and time. He has normal reflexes.  Skin: No rash noted. No erythema.  Toenail fungus  Psychiatric: He has a normal mood and affect. His behavior is normal. Judgment and thought content normal.    Results for orders placed or performed during the hospital encounter of 09/05/15  Glucose, capillary  Result Value Ref Range   Glucose-Capillary 103 (H) 65 - 99 mg/dL  Glucose, capillary  Result Value Ref Range   Glucose-Capillary 116 (H) 65 - 99 mg/dL      Assessment & Plan:   Problem List Items Addressed This Visit      Cardiovascular and Mediastinum   Essential hypertension    The current medical regimen is effective;  continue present plan and medications.         Endocrine   Diabetes mellitus without complication (HCC)    Diet controled      Relevant Orders   Bayer DCA Hb A1c Waived    Other Visit Diagnoses    Routine general medical examination at a health care facility    -  Primary  Relevant Orders    CBC with Differential/Platelet    Comprehensive metabolic panel    Lipid Panel w/o Chol/HDL Ratio    PSA    TSH    Urinalysis, Routine w reflex microscopic ([facility])    Healthcare maintenance        Relevant Orders    Hepatitis C Antibody        Follow up plan: Return in about 6 months (around 06/25/2016) for a1c and bmp.

## 2015-12-24 NOTE — Assessment & Plan Note (Signed)
The current medical regimen is effective;  continue present plan and medications.  

## 2015-12-25 ENCOUNTER — Other Ambulatory Visit: Payer: Self-pay

## 2015-12-25 DIAGNOSIS — Z0000 Encounter for general adult medical examination without abnormal findings: Secondary | ICD-10-CM

## 2015-12-26 ENCOUNTER — Other Ambulatory Visit: Payer: Self-pay

## 2015-12-26 ENCOUNTER — Other Ambulatory Visit: Payer: Medicare Other

## 2015-12-26 DIAGNOSIS — I10 Essential (primary) hypertension: Secondary | ICD-10-CM

## 2015-12-26 DIAGNOSIS — Z0000 Encounter for general adult medical examination without abnormal findings: Secondary | ICD-10-CM

## 2015-12-27 LAB — CBC WITH DIFFERENTIAL/PLATELET
BASOS ABS: 0.1 10*3/uL (ref 0.0–0.2)
BASOS: 1 %
EOS (ABSOLUTE): 0.2 10*3/uL (ref 0.0–0.4)
EOS: 3 %
HEMATOCRIT: 37.8 % (ref 37.5–51.0)
HEMOGLOBIN: 12.6 g/dL (ref 12.6–17.7)
IMMATURE GRANS (ABS): 0 10*3/uL (ref 0.0–0.1)
Immature Granulocytes: 1 %
LYMPHS ABS: 1.6 10*3/uL (ref 0.7–3.1)
LYMPHS: 26 %
MCH: 30.3 pg (ref 26.6–33.0)
MCHC: 33.3 g/dL (ref 31.5–35.7)
MCV: 91 fL (ref 79–97)
MONOCYTES: 13 %
Monocytes Absolute: 0.8 10*3/uL (ref 0.1–0.9)
NEUTROS ABS: 3.4 10*3/uL (ref 1.4–7.0)
Neutrophils: 56 %
Platelets: 258 10*3/uL (ref 150–379)
RBC: 4.16 x10E6/uL (ref 4.14–5.80)
RDW: 13.8 % (ref 12.3–15.4)
WBC: 6 10*3/uL (ref 3.4–10.8)

## 2015-12-27 LAB — COMPREHENSIVE METABOLIC PANEL
ALBUMIN: 4.2 g/dL (ref 3.6–4.8)
ALT: 19 IU/L (ref 0–44)
AST: 20 IU/L (ref 0–40)
Albumin/Globulin Ratio: 1.3 (ref 1.2–2.2)
Alkaline Phosphatase: 68 IU/L (ref 39–117)
BILIRUBIN TOTAL: 0.4 mg/dL (ref 0.0–1.2)
BUN/Creatinine Ratio: 20 (ref 10–24)
BUN: 24 mg/dL (ref 8–27)
CALCIUM: 9.9 mg/dL (ref 8.6–10.2)
CHLORIDE: 100 mmol/L (ref 96–106)
CO2: 24 mmol/L (ref 18–29)
CREATININE: 1.19 mg/dL (ref 0.76–1.27)
GFR calc non Af Amer: 63 mL/min/{1.73_m2} (ref >59)
GFR, EST AFRICAN AMERICAN: 73 mL/min/{1.73_m2} (ref >59)
GLUCOSE: 102 mg/dL — AB (ref 65–99)
Globulin, Total: 3.2 g/dL (ref 1.5–4.5)
Potassium: 4.7 mmol/L (ref 3.5–5.2)
Sodium: 140 mmol/L (ref 134–144)
TOTAL PROTEIN: 7.4 g/dL (ref 6.0–8.5)

## 2015-12-27 LAB — LIPID PANEL W/O CHOL/HDL RATIO
CHOLESTEROL TOTAL: 178 mg/dL (ref 100–199)
HDL: 35 mg/dL — AB (ref >39)
LDL CALC: 105 mg/dL — AB (ref 0–99)
Triglycerides: 192 mg/dL — ABNORMAL HIGH (ref 0–149)
VLDL CHOLESTEROL CAL: 38 mg/dL (ref 5–40)

## 2015-12-27 LAB — PSA: Prostate Specific Ag, Serum: 0.7 ng/mL (ref 0.0–4.0)

## 2015-12-27 LAB — TSH: TSH: 0.809 u[IU]/mL (ref 0.450–4.500)

## 2015-12-27 LAB — HEPATITIS C ANTIBODY: Hep C Virus Ab: <0.1 s/co ratio (ref 0.0–0.9)

## 2015-12-29 ENCOUNTER — Encounter: Payer: Self-pay | Admitting: Family Medicine

## 2016-06-30 ENCOUNTER — Ambulatory Visit (INDEPENDENT_AMBULATORY_CARE_PROVIDER_SITE_OTHER): Payer: Medicare Other | Admitting: Family Medicine

## 2016-06-30 ENCOUNTER — Encounter: Payer: Self-pay | Admitting: Family Medicine

## 2016-06-30 VITALS — BP 132/72 | HR 68 | Temp 97.7°F | Wt 264.0 lb

## 2016-06-30 DIAGNOSIS — E119 Type 2 diabetes mellitus without complications: Secondary | ICD-10-CM

## 2016-06-30 DIAGNOSIS — Z23 Encounter for immunization: Secondary | ICD-10-CM

## 2016-06-30 DIAGNOSIS — E1142 Type 2 diabetes mellitus with diabetic polyneuropathy: Secondary | ICD-10-CM | POA: Diagnosis not present

## 2016-06-30 DIAGNOSIS — I10 Essential (primary) hypertension: Secondary | ICD-10-CM | POA: Diagnosis not present

## 2016-06-30 LAB — BAYER DCA HB A1C WAIVED: HB A1C: 6 % (ref <7.0)

## 2016-06-30 LAB — MICROALBUMIN, URINE WAIVED
CREATININE, URINE WAIVED: 200 mg/dL (ref 10–300)
MICROALB, UR WAIVED: 80 mg/L — AB (ref 0–19)

## 2016-06-30 MED ORDER — LISINOPRIL 10 MG PO TABS: 10.0000 mg | ORAL_TABLET | Freq: Every day | ORAL | 2 refills | Status: DC

## 2016-06-30 NOTE — Progress Notes (Signed)
BP 132/72   Pulse 68   Temp 97.7 F (36.5 C)   Wt 264 lb (119.7 kg)   SpO2 97%   BMI 33.00 kg/m    Subjective:    Patient ID: Daniel Leon, male    DOB: 1951-06-17, 65 y.o.   MRN: [id number]  HPI: Daniel EhlersMichael H Hepp is a 65 y.o. male  Chief Complaint  Patient presents with  . Diabetes  . Hypertension  Patient follow-up doing well with diabetes and hypertension no complaints from medications Patient relates his peripheral neuropathy has changed. Unfortunately this was noticed after a roll of tape fell in his shoe which was unnoticed putting on a shoe and not discovered until after her 3 mile walk with bloody toenails and blisters on his toes. Patient's diabetes remains good control.  Blood pressure remains well no problems with medications taken faithfully.  Relevant past medical, surgical, family and social history reviewed and updated as indicated. Interim medical history since our last visit reviewed. Allergies and medications reviewed and updated.  Review of Systems  Constitutional: Negative.   Respiratory: Negative.   Cardiovascular: Negative.     Per HPI unless specifically indicated above     Objective:    BP 132/72   Pulse 68   Temp 97.7 F (36.5 C)   Wt 264 lb (119.7 kg)   SpO2 97%   BMI 33.00 kg/m   Wt Readings from Last 3 Encounters:  06/30/16 264 lb (119.7 kg)  12/24/15 263 lb (119.3 kg)  08/29/15 260 lb (117.9 kg)    Physical Exam  Constitutional: He is oriented to person, place, and time. He appears well-developed and well-nourished. No distress.  HENT:  Head: Normocephalic and atraumatic.  Right Ear: Hearing normal.  Left Ear: Hearing normal.  Nose: Nose normal.  Eyes: Conjunctivae and lids are normal. Right eye exhibits no discharge. Left eye exhibits no discharge. No scleral icterus.  Cardiovascular: Normal rate, regular rhythm and normal heart sounds.   Pulmonary/Chest: Effort normal and breath sounds normal. No respiratory  distress.  Musculoskeletal: Normal range of motion.  Neurological: He is alert and oriented to person, place, and time.  Foot exam with absence of sensation to monofilament testing. This is a change from previous testing area  Skin: Skin is intact. No rash noted.  Psychiatric: He has a normal mood and affect. His speech is normal and behavior is normal. Judgment and thought content normal. Cognition and memory are normal.    Results for orders placed or performed in visit on 12/26/15  CBC w/Diff  Result Value Ref Range   WBC 6.0 3.4 - 10.8 x10E3/uL   RBC 4.16 4.14 - 5.80 x10E6/uL   Hemoglobin 12.6 12.6 - 17.7 g/dL   Hematocrit 78.437.8 69.637.5 - 51.0 %   MCV 91 79 - 97 fL   MCH 30.3 26.6 - 33.0 pg   MCHC 33.3 31.5 - 35.7 g/dL   RDW 29.513.8 28.412.3 - 13.215.4 %   Platelets 258 150 - 379 x10E3/uL   Neutrophils 56 %   Lymphs 26 %   Monocytes 13 %   Eos 3 %   Basos 1 %   Neutrophils Absolute 3.4 1.4 - 7.0 x10E3/uL   Lymphocytes Absolute 1.6 0.7 - 3.1 x10E3/uL   Monocytes Absolute 0.8 0.1 - 0.9 x10E3/uL   EOS (ABSOLUTE) 0.2 0.0 - 0.4 x10E3/uL   Basophils Absolute 0.1 0.0 - 0.2 x10E3/uL   Immature Granulocytes 1 %   Immature Grans (Abs) 0.0 0.0 -  0.1 x10E3/uL      Assessment & Plan:   Problem List Items Addressed This Visit      Cardiovascular and Mediastinum   Essential hypertension   Relevant Medications   lisinopril (PRINIVIL,ZESTRIL) 10 MG tablet   Other Relevant Orders   Basic metabolic panel     Endocrine   DM type 2 with diabetic peripheral neuropathy (HCC)   Relevant Medications   lisinopril (PRINIVIL,ZESTRIL) 10 MG tablet   RESOLVED: Diabetes mellitus without complication (HCC) - Primary   Relevant Medications   lisinopril (PRINIVIL,ZESTRIL) 10 MG tablet   Other Relevant Orders   Bayer DCA Hb A1c Waived   Microalbumin, Urine Waived   Pneumococcal polysaccharide vaccine 23-valent greater than or equal to 2yo subcutaneous/IM (Completed)    Other Visit Diagnoses    Need for  influenza vaccination       Need for pneumococcal vaccination       Relevant Orders   Pneumococcal polysaccharide vaccine 23-valent greater than or equal to 2yo subcutaneous/IM (Completed)   Encounter for immunization       Relevant Orders   Flu vaccine HIGH DOSE PF (Completed)       Follow up plan: Return in about 6 months (around 12/28/2016) for Hemoglobin A1c.

## 2016-07-01 ENCOUNTER — Encounter: Payer: Self-pay | Admitting: Family Medicine

## 2016-07-01 LAB — BASIC METABOLIC PANEL
BUN / CREAT RATIO: 19 (ref 10–24)
BUN: 21 mg/dL (ref 8–27)
CHLORIDE: 103 mmol/L (ref 96–106)
CO2: 22 mmol/L (ref 18–29)
Calcium: 9.7 mg/dL (ref 8.6–10.2)
Creatinine, Ser: 1.13 mg/dL (ref 0.76–1.27)
GFR, EST AFRICAN AMERICAN: 78 mL/min/{1.73_m2} (ref >59)
GFR, EST NON AFRICAN AMERICAN: 67 mL/min/{1.73_m2} (ref >59)
Glucose: 118 mg/dL — ABNORMAL HIGH (ref 65–99)
POTASSIUM: 5.3 mmol/L — AB (ref 3.5–5.2)
Sodium: 140 mmol/L (ref 134–144)

## 2016-12-16 ENCOUNTER — Ambulatory Visit: Payer: Medicare Other | Admitting: Family Medicine

## 2016-12-29 ENCOUNTER — Ambulatory Visit (INDEPENDENT_AMBULATORY_CARE_PROVIDER_SITE_OTHER): Payer: Medicare Other | Admitting: Family Medicine

## 2016-12-29 ENCOUNTER — Encounter: Payer: Self-pay | Admitting: Family Medicine

## 2016-12-29 VITALS — BP 126/76 | HR 69 | Ht 76.0 in | Wt 269.0 lb

## 2016-12-29 DIAGNOSIS — I10 Essential (primary) hypertension: Secondary | ICD-10-CM

## 2016-12-29 DIAGNOSIS — Z0000 Encounter for general adult medical examination without abnormal findings: Secondary | ICD-10-CM

## 2016-12-29 DIAGNOSIS — Z7189 Other specified counseling: Secondary | ICD-10-CM | POA: Diagnosis not present

## 2016-12-29 DIAGNOSIS — E1142 Type 2 diabetes mellitus with diabetic polyneuropathy: Secondary | ICD-10-CM

## 2016-12-29 DIAGNOSIS — D649 Anemia, unspecified: Secondary | ICD-10-CM

## 2016-12-29 DIAGNOSIS — G629 Polyneuropathy, unspecified: Secondary | ICD-10-CM | POA: Diagnosis not present

## 2016-12-29 DIAGNOSIS — N400 Enlarged prostate without lower urinary tract symptoms: Secondary | ICD-10-CM | POA: Insufficient documentation

## 2016-12-29 LAB — URINALYSIS, ROUTINE W REFLEX MICROSCOPIC
Bilirubin, UA: NEGATIVE
GLUCOSE, UA: NEGATIVE
Ketones, UA: NEGATIVE
Leukocytes, UA: NEGATIVE
NITRITE UA: NEGATIVE
Protein, UA: NEGATIVE
Specific Gravity, UA: 1.025 (ref 1.005–1.030)
UUROB: 0.2 mg/dL (ref 0.2–1.0)
pH, UA: 5.5 (ref 5.0–7.5)

## 2016-12-29 LAB — MICROSCOPIC EXAMINATION: Bacteria, UA: NONE SEEN

## 2016-12-29 LAB — BAYER DCA HB A1C WAIVED: HB A1C (BAYER DCA - WAIVED): 5.5 % (ref <7.0)

## 2016-12-29 MED ORDER — LISINOPRIL 10 MG PO TABS: 10.0000 mg | ORAL_TABLET | Freq: Every day | ORAL | 4 refills | Status: DC

## 2016-12-29 MED ORDER — LISINOPRIL 10 MG PO TABS: 10.0000 mg | ORAL_TABLET | Freq: Every day | ORAL | 2 refills | Status: DC

## 2016-12-29 NOTE — Assessment & Plan Note (Signed)
Discuss neuropathy patient with worsening bilateral foot neuropathy. Patient also with hand neuropathy in hand that had surgery. Not sure if this is related to similar neuropathy of his feet or postsurgical changes. Because of confusion will check vitamin B12 folic acid also thyroid. Will refer to neurology to further evaluate.

## 2016-12-29 NOTE — Progress Notes (Addendum)
BP 126/76   Pulse 69   Ht '6\' 4"'  (1.93 m)   Wt 269 lb (122 kg)   SpO2 97%   BMI 32.74 kg/m    Subjective:    Patient ID: Daniel Leon, male    DOB: 02/12/1951, 66 y.o.   MRN: [id number]  HPI: GIOVANIE Leon is a 66 y.o. male  Chief Complaint  Patient presents with  . Follow-up   Annual exam AWV metrics met  Patient's for about a month or so has had some left ankle swelling tenderness discomfort with activity goes away with rest. Has been walking 3 miles or so 5 days a week on sidewalks or level gravel through her graveyard. Has been doing this walk for a couple years but over the just last month has noticed his ankle swelling and discomfort. Has these symptoms pretty much with any activity and goes away with rest. Rotates through various shoes and shoes are in good shape.  Patient doing well with blood pressure no issues with lisinopril low dose 10 mg once a day. Diet-controlled diabetes doing well no complaints. Rarely checks blood sugars with no extra excursions.  Relevant past medical, surgical, family and social history reviewed and updated as indicated. Interim medical history since our last visit reviewed. Allergies and medications reviewed and updated.  Review of Systems  Constitutional: Negative.   HENT: Negative.   Eyes: Negative.   Respiratory: Negative.   Cardiovascular: Negative.   Gastrointestinal: Negative.   Endocrine: Negative.   Genitourinary: Negative.   Musculoskeletal: Negative.   Skin: Negative.   Allergic/Immunologic: Negative.   Neurological: Negative.   Hematological: Negative.   Psychiatric/Behavioral: Negative.     Per HPI unless specifically indicated above     Objective:    BP 126/76   Pulse 69   Ht '6\' 4"'  (1.93 m)   Wt 269 lb (122 kg)   SpO2 97%   BMI 32.74 kg/m   Wt Readings from Last 3 Encounters:  12/29/16 269 lb (122 kg)  06/30/16 264 lb (119.7 kg)  12/24/15 263 lb (119.3 kg)    Physical Exam  Constitutional:  He is oriented to person, place, and time. He appears well-developed and well-nourished.  HENT:  Head: Normocephalic and atraumatic.  Right Ear: External ear normal.  Left Ear: External ear normal.  Eyes: Conjunctivae and EOM are normal. Pupils are equal, round, and reactive to light.  Neck: Normal range of motion. Neck supple.  Cardiovascular: Normal rate, regular rhythm, normal heart sounds and intact distal pulses.   Pulmonary/Chest: Effort normal and breath sounds normal.  Abdominal: Soft. Bowel sounds are normal. There is no splenomegaly or hepatomegaly.  Genitourinary: Rectum normal and penis normal.  Genitourinary Comments: BPH changes  Musculoskeletal: Normal range of motion.  Neurological: He is alert and oriented to person, place, and time. He has normal reflexes.  Diminished sensation feet  Skin: No rash noted. No erythema.  Psychiatric: He has a normal mood and affect. His behavior is normal. Judgment and thought content normal.    Results for orders placed or performed in visit on 12/29/16  Bayer DCA Hb A1c Waived  Result Value Ref Range   Bayer DCA Hb A1c Waived 5.5 <7.0 %      Assessment & Plan:   Problem List Items Addressed This Visit      Cardiovascular and Mediastinum   Essential hypertension - Primary    The current medical regimen is effective;  continue present plan and medications.  Relevant Medications   lisinopril (PRINIVIL,ZESTRIL) 10 MG tablet   Other Relevant Orders   Bayer DCA Hb A1c Waived (Completed)   Comprehensive metabolic panel   Lipid panel   CBC with Differential/Platelet   TSH   Urinalysis, Routine w reflex microscopic     Endocrine   DM type 2 with diabetic peripheral neuropathy (HCC)    Hemoglobin A1c's remained normal and never been bad concerned that diabetic peripheral neuropathy is incorrect diagnosis will refer to neuropathy to help evaluate.      Relevant Medications   lisinopril (PRINIVIL,ZESTRIL) 10 MG tablet    Other Relevant Orders   Bayer DCA Hb A1c Waived (Completed)   Lipid panel   CBC with Differential/Platelet   TSH   Urinalysis, Routine w reflex microscopic     Nervous and Auditory   Neuropathy    Discuss neuropathy patient with worsening bilateral foot neuropathy. Patient also with hand neuropathy in hand that had surgery. Not sure if this is related to similar neuropathy of his feet or postsurgical changes. Because of confusion will check vitamin R44 folic acid also thyroid. Will refer to neurology to further evaluate.      Relevant Orders   TSH   Urinalysis, Routine w reflex microscopic   Vitamin B12   Folate   Ambulatory referral to Neurology     Genitourinary   BPH (benign prostatic hyperplasia)   Relevant Orders   PSA     Other   Anemia   PE (physical exam), annual   Advanced care planning/counseling discussion    A voluntary discussion about advance care planning including the explanation and discussion of advance directives was extensively discussed  with the patient.  Explanation about the health care proxy and Living will was reviewed and packet with forms with explanation of how to fill them out was given.  .  Patient was offered a separate [place] visit for further assistance with forms.             Follow up plan: Return in about 6 months (around 07/01/2017) for Hemoglobin A1c, BMP.

## 2016-12-29 NOTE — Assessment & Plan Note (Signed)
A voluntary discussion about advance care planning including the explanation and discussion of advance directives was extensively discussed  with the patient.  Explanation about the health care proxy and Living will was reviewed and packet with forms with explanation of how to fill them out was given.  .  Patient was offered a separate Advance Care Planning visit for further assistance with forms.    

## 2016-12-29 NOTE — Assessment & Plan Note (Signed)
Hemoglobin A1c's remained normal and never been bad concerned that diabetic peripheral neuropathy is incorrect diagnosis will refer to neuropathy to help evaluate.

## 2016-12-29 NOTE — Assessment & Plan Note (Signed)
The current medical regimen is effective;  continue present plan and medications.  

## 2016-12-30 ENCOUNTER — Telehealth: Payer: Self-pay | Admitting: Family Medicine

## 2016-12-30 DIAGNOSIS — D649 Anemia, unspecified: Secondary | ICD-10-CM

## 2016-12-30 LAB — LIPID PANEL
CHOL/HDL RATIO: 5.6 ratio — AB (ref 0.0–5.0)
CHOLESTEROL TOTAL: 197 mg/dL (ref 100–199)
HDL: 35 mg/dL — ABNORMAL LOW (ref >39)
LDL CALC: 120 mg/dL — AB (ref 0–99)
TRIGLYCERIDES: 208 mg/dL — AB (ref 0–149)
VLDL CHOLESTEROL CAL: 42 mg/dL — AB (ref 5–40)

## 2016-12-30 LAB — COMPREHENSIVE METABOLIC PANEL
ALBUMIN: 4.4 g/dL (ref 3.6–4.8)
ALT: 19 IU/L (ref 0–44)
AST: 20 IU/L (ref 0–40)
Albumin/Globulin Ratio: 1.4 (ref 1.2–2.2)
Alkaline Phosphatase: 69 IU/L (ref 39–117)
BUN / CREAT RATIO: 21 (ref 10–24)
BUN: 23 mg/dL (ref 8–27)
Bilirubin Total: 0.3 mg/dL (ref 0.0–1.2)
CO2: 24 mmol/L (ref 18–29)
CREATININE: 1.12 mg/dL (ref 0.76–1.27)
Calcium: 9.6 mg/dL (ref 8.6–10.2)
Chloride: 99 mmol/L (ref 96–106)
GFR, EST AFRICAN AMERICAN: 78 mL/min/{1.73_m2} (ref >59)
GFR, EST NON AFRICAN AMERICAN: 68 mL/min/{1.73_m2} (ref >59)
GLOBULIN, TOTAL: 3.2 g/dL (ref 1.5–4.5)
GLUCOSE: 96 mg/dL (ref 65–99)
Potassium: 4.6 mmol/L (ref 3.5–5.2)
SODIUM: 138 mmol/L (ref 134–144)
TOTAL PROTEIN: 7.6 g/dL (ref 6.0–8.5)

## 2016-12-30 LAB — CBC WITH DIFFERENTIAL/PLATELET
BASOS ABS: 0.1 10*3/uL (ref 0.0–0.2)
BASOS: 1 %
EOS (ABSOLUTE): 0.2 10*3/uL (ref 0.0–0.4)
Eos: 3 %
Hematocrit: 37.6 % (ref 37.5–51.0)
Hemoglobin: 12.7 g/dL — ABNORMAL LOW (ref 13.0–17.7)
IMMATURE GRANS (ABS): 0 10*3/uL (ref 0.0–0.1)
Immature Granulocytes: 1 %
LYMPHS ABS: 1.6 10*3/uL (ref 0.7–3.1)
Lymphs: 29 %
MCH: 29.8 pg (ref 26.6–33.0)
MCHC: 33.8 g/dL (ref 31.5–35.7)
MCV: 88 fL (ref 79–97)
MONOS ABS: 0.6 10*3/uL (ref 0.1–0.9)
Monocytes: 11 %
NEUTROS ABS: 3.1 10*3/uL (ref 1.4–7.0)
Neutrophils: 55 %
PLATELETS: 296 10*3/uL (ref 150–379)
RBC: 4.26 x10E6/uL (ref 4.14–5.80)
RDW: 13.3 % (ref 12.3–15.4)
WBC: 5.6 10*3/uL (ref 3.4–10.8)

## 2016-12-30 LAB — VITAMIN B12: Vitamin B-12: 692 pg/mL (ref 232–1245)

## 2016-12-30 LAB — TSH: TSH: 1.26 u[IU]/mL (ref 0.450–4.500)

## 2016-12-30 LAB — FOLATE

## 2016-12-30 LAB — PSA: Prostate Specific Ag, Serum: 0.8 ng/mL (ref 0.0–4.0)

## 2016-12-30 NOTE — Telephone Encounter (Signed)
Phone call Discussed with patient slightly low hemoglobin but higher than last year will recheck CBC in a month or so. B12 folic acid and thyroid all normal

## 2017-01-27 ENCOUNTER — Other Ambulatory Visit: Payer: Medicare Other

## 2017-01-27 DIAGNOSIS — D649 Anemia, unspecified: Secondary | ICD-10-CM

## 2017-01-28 LAB — CBC WITH DIFFERENTIAL/PLATELET
BASOS: 1 %
Basophils Absolute: 0.1 10*3/uL (ref 0.0–0.2)
EOS (ABSOLUTE): 0.2 10*3/uL (ref 0.0–0.4)
EOS: 3 %
HEMOGLOBIN: 12.8 g/dL — AB (ref 13.0–17.7)
Hematocrit: 37.9 % (ref 37.5–51.0)
IMMATURE GRANS (ABS): 0.1 10*3/uL (ref 0.0–0.1)
IMMATURE GRANULOCYTES: 1 %
LYMPHS: 29 %
Lymphocytes Absolute: 1.8 10*3/uL (ref 0.7–3.1)
MCH: 30.2 pg (ref 26.6–33.0)
MCHC: 33.8 g/dL (ref 31.5–35.7)
MCV: 89 fL (ref 79–97)
MONOS ABS: 0.7 10*3/uL (ref 0.1–0.9)
Monocytes: 11 %
NEUTROS PCT: 55 %
Neutrophils Absolute: 3.5 10*3/uL (ref 1.4–7.0)
Platelets: 246 10*3/uL (ref 150–379)
RBC: 4.24 x10E6/uL (ref 4.14–5.80)
RDW: 14.4 % (ref 12.3–15.4)
WBC: 6.4 10*3/uL (ref 3.4–10.8)

## 2017-01-31 ENCOUNTER — Telehealth: Payer: Self-pay | Admitting: Family Medicine

## 2017-01-31 NOTE — Telephone Encounter (Signed)
Phone call Discussed with patient hemoglobin slight increase patient taking multiple vitamins on chart review iron studies all normal. Patient had a [facility] about [facility] ago [facility]. [facility] regarding any further intervention.

## 2017-02-01 ENCOUNTER — Telehealth: Payer: Self-pay

## 2017-02-01 NOTE — Telephone Encounter (Signed)
-----   Message from Steele SizerMark A Crissman, MD sent at 02/01/2017  1:09 PM EDT ----- Regarding: Colonoscopy Patient thinks his [facility] colon[facility] by Dr. Mechele CollinElliott please confirm and need to report please with attention to when his next follow-up is. ----- Message ----- From: Richarda OverlieFox, Ramzy Cappelletti A, CMA Sent: 01/31/2017  11:46 AM To: Steele SizerMark A Crissman, MD

## 2017-02-01 NOTE — Telephone Encounter (Signed)
Try our old chart

## 2017-02-01 NOTE — Telephone Encounter (Signed)
[facility] notes from [facility] stated that colonoscopy was done 11/21/12, but no colonoscopy notes found where it was actually done.

## 2017-02-01 NOTE — Telephone Encounter (Signed)
[facility]. She couldn't find any record of pt ever being seen there. I also checked Care everywhere w/o any notes of pt being seen at GI

## 2017-02-01 NOTE — Telephone Encounter (Signed)
Keep going try Dr. Anola Gurneywalls office

## 2017-02-02 ENCOUNTER — Telehealth: Payer: Self-pay | Admitting: Family Medicine

## 2017-02-02 DIAGNOSIS — M25472 Effusion, left ankle: Secondary | ICD-10-CM

## 2017-02-02 NOTE — Telephone Encounter (Signed)
Patient was transferred to provider for telephone conversation.   

## 2017-02-02 NOTE — Telephone Encounter (Signed)
Patient is calling requesting to speak with Daniel AlmondJada about the problem with his ankle.  He states that Dr Dossie Arbourrissman is aware of a problem he has been having with is ankle and is wanting to know what he would suggest to do next.  Thanks

## 2017-02-02 NOTE — Telephone Encounter (Signed)
Phone call Discussed with patient still waiting on work from GI regarding slight decline of hemoglobin.  Patient calling concerning his ankle is still bothersome and actually getting worse with swelling and difficulty with walking. Will set up appointment in orthopedics to further evaluate.

## 2017-02-03 ENCOUNTER — Telehealth: Payer: Self-pay | Admitting: Family Medicine

## 2017-02-03 NOTE — Telephone Encounter (Signed)
Phone call Discussed with patient recent phone call Dr.Wohl. Reviewed labs with Dr. Servando SnareWohl and colonoscopy and have determined more likely will hemoglobins normal variant for the patient will not do any further intervention and just observe.

## 2017-02-03 NOTE — Telephone Encounter (Signed)
Fax received from Dr. Annabell SabalWohl's office. Placed on provider's desk for review.

## 2017-03-16 ENCOUNTER — Telehealth: Payer: Self-pay | Admitting: Family Medicine

## 2017-03-16 NOTE — Telephone Encounter (Signed)
Called pt to schedule Annual Wellness Visit with [facility]  - knb  °

## 2017-04-07 ENCOUNTER — Ambulatory Visit (INDEPENDENT_AMBULATORY_CARE_PROVIDER_SITE_OTHER): Payer: Medicare Other

## 2017-04-07 VITALS — BP 134/85 | HR 84 | Temp 97.8°F | Resp 16 | Ht 76.0 in | Wt 274.9 lb

## 2017-04-07 DIAGNOSIS — Z0000 Encounter for general adult medical examination without abnormal findings: Secondary | ICD-10-CM | POA: Diagnosis not present

## 2017-04-07 NOTE — Progress Notes (Signed)
Subjective:   Daniel Leon is a 66 y.o. male who presents for Medicare Annual/Subsequent preventive examination.  Review of Systems:  Cardiac Risk Factors include: advanced age (>62men, >72 women);male gender;obesity (BMI >30kg/m2);hypertension;diabetes mellitus     Objective:    Vitals: BP 134/85 (BP Location: Left Arm, Patient Position: Sitting)   Pulse 84   Temp 97.8 F (36.6 C)   Resp 16   Ht 6\' 4"  (1.93 m)   Wt 274 lb 14.4 oz (124.7 kg)   BMI 33.46 kg/m   Body mass index is 33.46 kg/m.  Tobacco History  Smoking Status  . Former Smoker  . Packs/day: 0.50  . Years: 10.00  . Types: Cigarettes  . Quit date: 07/27/2000  Smokeless Tobacco  . Never Used     Counseling given: Not Answered   Past Medical History:  Diagnosis Date  . Diabetes mellitus without complication (HCC)    DIET-CONTROLLED  . Essential hypertension   . Right bundle branch block (RBBB)    Past Surgical History:  Procedure Laterality Date  . COLONOSCOPY    . ELBOW SURGERY Right 1966  . ULNAR NERVE TRANSPOSITION Right 09/05/2015   Procedure: ULNAR NERVE DECOMPRESSION/TRANSPOSITION;  Surgeon: [name], MD;  Location: [facility] ORS;  Service: Orthopedics;  Laterality: Right;   Family History  Problem Relation Age of Onset  . Heart disease Mother   . Hypertension Mother   . Heart disease Father   . Hypertension Father   . Hypertension Brother    History  Sexual Activity  . Sexual activity: Not on file    Outpatient Encounter Prescriptions as of 04/07/2017  Medication Sig  . vitamin E 200 UNIT capsule Take 200 Units by mouth daily.  Marland Kitchen lisinopril (PRINIVIL,ZESTRIL) 10 MG tablet Take 1 tablet (10 mg total) by mouth daily.  . meloxicam (MOBIC) 15 MG tablet meloxicam 15 mg tablet  . Multiple Vitamin (MULTIVITAMIN) tablet Take 1 tablet by mouth daily.   No facility-administered encounter medications on file as of 04/07/2017.     Activities of Daily Living In your present state of  health, do you have any difficulty performing the following activities: 04/07/2017  Hearing? N  Vision? N  Difficulty concentrating or making decisions? N  Walking or climbing stairs? N  Dressing or bathing? N  Doing errands, shopping? N  Preparing Food and eating ? N  Using the Toilet? N  In the past six months, have you accidently leaked urine? N  Do you have problems with loss of bowel control? N  Managing your Medications? N  Managing your Finances? N  Housekeeping or managing your Housekeeping? N  Some recent data might be hidden    Patient Care Team: Steele Sizer, MD as PCP - General (Family Medicine) [name], MD as Referring Physician (Specialist) Lavena Stanford, MD as Referring Physician (Orthopedic Surgery)   Assessment:     Exercise Activities and Dietary recommendations Current Exercise Habits: The patient does not participate in regular exercise at present, Exercise limited by: orthopedic condition(s) (ankle pain)  Goals    None     Fall Risk Fall Risk  04/07/2017 12/29/2016 12/24/2015 07/28/2015  Falls in the past year? No No No No   Depression Screen PHQ 2/9 Scores 04/07/2017 12/24/2015 07/28/2015  PHQ - 2 Score 0 0 0    Cognitive Function        Immunization History  Administered Date(s) Administered  . Influenza, High Dose Seasonal PF 06/30/2016  . Influenza,inj,Quad PF,36+  Mos 07/28/2015  . Pneumococcal Conjugate-13 12/17/2014  . Pneumococcal Polysaccharide-23 06/30/2016  . Tdap 07/25/2012  . Zoster 06/12/2013   Screening Tests Health Maintenance  Topic Date Due  . INFLUENZA VACCINE  03/23/2017  . OPHTHALMOLOGY EXAM  07/04/2017 (Originally 09/02/1960)  . FOOT EXAM  06/30/2017  . HEMOGLOBIN A1C  07/01/2017  . TETANUS/TDAP  07/25/2022  . COLONOSCOPY  12/13/2022  . Hepatitis C Screening  Completed  . PNA vac Low Risk Adult  Completed      Plan:    I have personally reviewed and addressed the Medicare Annual Wellness  questionnaire and have noted the following in the patient's chart:  A. Medical and social history B. Use of alcohol, tobacco or illicit drugs  C. Current medications and supplements D. Functional ability and status E.  Nutritional status F.  Physical activity G. Advance directives H. List of other physicians I.  Hospitalizations, surgeries, and ER visits in previous 12 months J.  Vitals K. Screenings such as hearing and vision if needed, cognitive and depression L. Referrals and appointments   In addition, I have reviewed and discussed with patient certain preventive protocols, quality metrics, and best practice recommendations. A written personalized care plan for preventive services as well as general preventive health recommendations were provided to patient.   Signed,  Marin Robertsiffany Marykathleen Russi, LPN Nurse Health Advisor   MD Recommendations: none

## 2017-04-07 NOTE — Patient Instructions (Addendum)
[name] , Thank you for taking time to come for your Medicare Wellness Visit. I appreciate your ongoing commitment to your health goals. Please review the following plan we discussed and let me know if I can assist you in the future.   Screening recommendations/referrals: Colonoscopy: completed 12/14/2012 Recommended yearly ophthalmology/optometry visit for glaucoma screening and checkup Recommended yearly dental visit for hygiene and checkup  Vaccinations: Influenza vaccine: up to date, due 04/2017 Pneumococcal vaccine: up to date Tdap vaccine: up to date Shingles vaccine: Up to date   Advanced directives: Please bring a copy of your health care power of attorney and living will to the office at your convenience.  Conditions/risks identified: none   Next appointment: Follow up on 07/05/2017 at 10:45 with Daniel Leon.  Follow up in one year for your annual wellness exam.   Preventive Care 65 Years and Older, Male Preventive care refers to lifestyle choices and visits with your health care provider that can promote health and wellness. What does preventive care include?  A yearly physical exam. This is also called an annual well check.  Dental exams once or twice a year.  Routine eye exams. Ask your health care provider how often you should have your eyes checked.  Personal lifestyle choices, including:  Daily care of your teeth and gums.  Regular physical activity.  Eating a healthy diet.  Avoiding tobacco and drug use.  Limiting alcohol use.  Practicing safe sex.  Taking low doses of aspirin every day.  Taking vitamin and mineral supplements as recommended by your health care provider. What happens during an annual well check? The services and screenings done by your health care provider during your annual well check will depend on your age, overall health, lifestyle risk factors, and family history of disease. Counseling  Your health care provider may ask you  questions about your:  Alcohol use.  Tobacco use.  Drug use.  Emotional well-being.  Home and relationship well-being.  Sexual activity.  Eating habits.  History of falls.  Memory and ability to understand (cognition).  Work and work Astronomerenvironment. Screening  You may have the following tests or measurements:  Height, weight, and BMI.  Blood pressure.  Lipid and cholesterol levels. These may be checked every 5 years, or more frequently if you are over 67 years old.  Skin check.  Lung cancer screening. You may have this screening every year starting at age 67 if you have a 30-pack-year history of smoking and currently smoke or have quit within the past 15 years.  Fecal occult blood test (FOBT) of the stool. You may have this test every year starting at age 67.  Flexible sigmoidoscopy or colonoscopy. You may have a sigmoidoscopy every 5 years or a colonoscopy every 10 years starting at age 67.  Prostate cancer screening. Recommendations will vary depending on your family history and other risks.  Hepatitis C blood test.  Hepatitis B blood test.  Sexually transmitted disease (STD) testing.  Diabetes screening. This is done by checking your blood sugar (glucose) after you have not eaten for a while (fasting). You may have this done every 1-3 years.  Abdominal aortic aneurysm (AAA) screening. You may need this if you are a current or former smoker.  Osteoporosis. You may be screened starting at age 67 if you are at high risk. Talk with your health care provider about your test results, treatment options, and if necessary, the need for more tests. Vaccines  Your health care provider may  recommend certain vaccines, such as:  Influenza vaccine. This is recommended every year.  Tetanus, diphtheria, and acellular pertussis (Tdap, Td) vaccine. You may need a Td booster every 10 years.  Zoster vaccine. You may need this after age 26.  Pneumococcal 13-valent conjugate  (PCV13) vaccine. One dose is recommended after age 59.  Pneumococcal polysaccharide (PPSV23) vaccine. One dose is recommended after age 61. Talk to your health care provider about which screenings and vaccines you need and how often you need them. This information is not intended to replace advice given to you by your health care provider. Make sure you discuss any questions you have with your health care provider. Document Released: 09/05/2015 Document Revised: 04/28/2016 Document Reviewed: 06/10/2015 Elsevier Interactive Patient Education  2017 [place] Prevention in the Home Falls can cause injuries. They can happen to people of all ages. There are many things you can do to make your home safe and to help prevent falls. What can I do on the outside of my home?  Regularly fix the edges of walkways and driveways and fix any cracks.  Remove anything that might make you trip as you walk through a door, such as a raised step or threshold.  Trim any bushes or trees on the path to your home.  Use bright outdoor lighting.  Clear any walking paths of anything that might make someone trip, such as rocks or tools.  Regularly check to see if handrails are loose or broken. Make sure that both sides of any steps have handrails.  Any raised decks and porches should have guardrails on the edges.  Have any leaves, snow, or ice cleared regularly.  Use sand or salt on walking paths during winter.  Clean up any spills in your garage right away. This includes oil or grease spills. What can I do in the bathroom?  Use night lights.  Install grab bars by the toilet and in the tub and shower. Do not use towel bars as grab bars.  Use non-skid mats or decals in the tub or shower.  If you need to sit down in the shower, use a plastic, non-slip stool.  Keep the floor dry. Clean up any water that spills on the floor as soon as it happens.  Remove soap buildup in the tub or shower  regularly.  Attach bath mats securely with double-sided non-slip rug tape.  Do not have throw rugs and other things on the floor that can make you trip. What can I do in the bedroom?  Use night lights.  Make sure that you have a light by your bed that is easy to reach.  Do not use any sheets or blankets that are too big for your bed. They should not hang down onto the floor.  Have a firm chair that has side arms. You can use this for support while you get dressed.  Do not have throw rugs and other things on the floor that can make you trip. What can I do in the kitchen?  Clean up any spills right away.  Avoid walking on wet floors.  Keep items that you use a lot in easy-to-reach places.  If you need to reach something above you, use a strong step stool that has a grab bar.  Keep electrical cords out of the way.  Do not use floor polish or wax that makes floors slippery. If you must use wax, use non-skid floor wax.  Do not have throw rugs and  other things on the floor that can make you trip. What can I do with my stairs?  Do not leave any items on the stairs.  Make sure that there are handrails on both sides of the stairs and use them. Fix handrails that are broken or loose. Make sure that handrails are as long as the stairways.  Check any carpeting to make sure that it is firmly attached to the stairs. Fix any carpet that is loose or worn.  Avoid having throw rugs at the top or bottom of the stairs. If you do have throw rugs, attach them to the floor with carpet tape.  Make sure that you have a light switch at the top of the stairs and the bottom of the stairs. If you do not have them, ask someone to add them for you. What else can I do to help prevent falls?  Wear shoes that:  Do not have high heels.  Have rubber bottoms.  Are comfortable and fit you well.  Are closed at the toe. Do not wear sandals.  If you use a stepladder:  Make sure that it is fully  opened. Do not climb a closed stepladder.  Make sure that both sides of the stepladder are locked into place.  Ask someone to hold it for you, if possible.  Clearly mark and make sure that you can see:  Any grab bars or handrails.  First and last steps.  Where the edge of each step is.  Use tools that help you move around (mobility aids) if they are needed. These include:  Canes.  Walkers.  Scooters.  Crutches.  Turn on the lights when you go into a dark area. Replace any light bulbs as soon as they burn out.  Set up your furniture so you have a clear path. Avoid moving your furniture around.  If any of your floors are uneven, fix them.  If there are any pets around you, be aware of where they are.  Review your medicines with your doctor. Some medicines can make you feel dizzy. This can increase your chance of falling. Ask your doctor what other things that you can do to help prevent falls. This information is not intended to replace advice given to you by your health care provider. Make sure you discuss any questions you have with your health care provider. Document Released: 06/05/2009 Document Revised: 01/15/2016 Document Reviewed: 09/13/2014 Elsevier Interactive Patient Education  2017 [employer].

## 2017-04-15 ENCOUNTER — Telehealth: Payer: Self-pay | Admitting: Family Medicine

## 2017-04-15 NOTE — Telephone Encounter (Signed)
Labs are printed and faxed to provided fax number.

## 2017-04-15 NOTE — Telephone Encounter (Signed)
[facility] has patient at appointment right now and is needing the last 6 months of labs faxed over to the office.  [facility] fax number: [phone]  Contract number: [phone]  Please Advise.  Thank you

## 2017-07-05 ENCOUNTER — Ambulatory Visit: Payer: Medicare Other | Admitting: Family Medicine

## 2017-07-27 ENCOUNTER — Telehealth: Payer: Self-pay | Admitting: Family Medicine

## 2017-07-27 ENCOUNTER — Ambulatory Visit: Payer: Self-pay | Admitting: *Deleted

## 2017-07-27 NOTE — Telephone Encounter (Signed)
Pt called complaining of painful urination that started on 07/26/17; pt states that his symptoms today [id number]:increasing pain when urinating, frequent urination and urine has an odor; appointment already scheduled with Dr Dossie Arbourrissman 07/28/17 at 1530; nurse triage initiatied; pt verbalizes understanding and offered the opportunity to see another provider that would be able to evaluate him before hsi appointment with Dr Dossie Arbourrissman; pt declines and would like to keep his upcoming appointment with Dr Dossie Arbourrissman.   Reason for Disposition . All other males with painful urination  Answer Assessment - Initial Assessment Questions 1. SEVERITY: "How bad is the pain?"  (e.g., Scale 1-10; mild, moderate, or severe)   - MILD (1-3): complains slightly about urination hurting   - MODERATE (4-7): interferes with normal activities     - SEVERE (8-10): excruciating, unwilling or unable to urinate because of the pain      Rated 6 out of 10 2. FREQUENCY: "How many times have you had painful urination today?"      6 3. PATTERN: "Is pain present every time you urinate or just sometimes?"      yes 4. ONSET: "When did the painful urination start?"      07/26/17 5. FEVER: "Do you have a fever?" If so, ask: "What is your temperature, how was it measured, and when did it start?"     no 6. PAST UTI: "Have you had a urine infection before?" If so, ask: "When was the last time?" and "What happened that time?"      no 7. CAUSE: "What do you think is causing the painful urination?"      unknown 8. OTHER SYMPTOMS: "Do you have any other symptoms?" (e.g., flank pain, penile discharge, scrotal pain, blood in urine)   Urine is cloudy, smells bad  Protocols used: URINATION PAIN - MALE-A-AH

## 2017-07-27 NOTE — Telephone Encounter (Signed)
See triage note dated 07/27/17

## 2017-07-28 ENCOUNTER — Ambulatory Visit: Payer: Medicare Other | Admitting: Family Medicine

## 2017-07-28 ENCOUNTER — Encounter: Payer: Self-pay | Admitting: Family Medicine

## 2017-07-28 VITALS — BP 138/79 | HR 65 | Ht 76.0 in | Wt 284.0 lb

## 2017-07-28 DIAGNOSIS — I10 Essential (primary) hypertension: Secondary | ICD-10-CM | POA: Diagnosis not present

## 2017-07-28 DIAGNOSIS — E1142 Type 2 diabetes mellitus with diabetic polyneuropathy: Secondary | ICD-10-CM | POA: Diagnosis not present

## 2017-07-28 DIAGNOSIS — N3000 Acute cystitis without hematuria: Secondary | ICD-10-CM

## 2017-07-28 DIAGNOSIS — N390 Urinary tract infection, site not specified: Secondary | ICD-10-CM | POA: Insufficient documentation

## 2017-07-28 DIAGNOSIS — R399 Unspecified symptoms and signs involving the genitourinary system: Secondary | ICD-10-CM | POA: Diagnosis not present

## 2017-07-28 LAB — URINALYSIS, ROUTINE W REFLEX MICROSCOPIC
Bilirubin, UA: NEGATIVE
Glucose, UA: NEGATIVE
KETONES UA: NEGATIVE
Nitrite, UA: NEGATIVE
SPEC GRAV UA: 1.015 (ref 1.005–1.030)
Urobilinogen, Ur: 0.2 mg/dL (ref 0.2–1.0)
pH, UA: 5.5 (ref 5.0–7.5)

## 2017-07-28 LAB — MICROSCOPIC EXAMINATION: WBC, UA: >30 /hpf — AB (ref 0–?)

## 2017-07-28 LAB — BAYER DCA HB A1C WAIVED: HB A1C (BAYER DCA - WAIVED): 5.9 % (ref <7.0)

## 2017-07-28 MED ORDER — SULFAMETHOXAZOLE-TRIMETHOPRIM 800-160 MG PO TABS: 1.0000 | ORAL_TABLET | Freq: Two times a day (BID) | ORAL | 0 refills | Status: DC

## 2017-07-28 NOTE — Assessment & Plan Note (Signed)
The current medical regimen is effective;  continue present plan and medications.  

## 2017-07-28 NOTE — Progress Notes (Signed)
BP 138/79   Pulse 65   Ht 6\' 4"  (1.93 m)   Wt 284 lb (128.8 kg)   SpO2 98%   BMI 34.57 kg/m    Subjective:    Patient ID: Daniel Leon, male    DOB: [birth year], 66 y.o.   MRN: [id number]  HPI: Daniel EhlersMichael H Natarajan is a 66 y.o. male  Chief Complaint  Patient presents with  . Follow-up  . Diabetes  . Urinary Tract Infection  patient follow-up recovering well from ankle surgery done at Halloween. Still on crutches but all in all doing okay. Patient over the last week has developed some frequency urgency dysuria no blood in stool or urine. Has had some irregular bowel movements with sometimes loose and sometimes constipated. May be related to the vitamin C he is taking. Diabetes doing well no complaints noted low blood sugar spells. Blood pressure also doing well without complaints.  Relevant past medical, surgical, family and social history reviewed and updated as indicated. Interim medical history since our last visit reviewed. Allergies and medications reviewed and updated.  Review of Systems  Constitutional: Negative.   Respiratory: Negative.   Cardiovascular: Negative.     Per HPI unless specifically indicated above     Objective:    BP 138/79   Pulse 65   Ht 6\' 4"  (1.93 m)   Wt 284 lb (128.8 kg)   SpO2 98%   BMI 34.57 kg/m   Wt Readings from Last 3 Encounters:  07/28/17 284 lb (128.8 kg)  04/07/17 274 lb 14.4 oz (124.7 kg)  12/29/16 269 lb (122 kg)    Physical Exam  Constitutional: He is oriented to person, place, and time. He appears well-developed and well-nourished.  HENT:  Head: Normocephalic and atraumatic.  Eyes: Conjunctivae and EOM are normal.  Neck: Normal range of motion.  Cardiovascular: Normal rate, regular rhythm and normal heart sounds.  Pulmonary/Chest: Effort normal and breath sounds normal.  Musculoskeletal: Normal range of motion.  Neurological: He is alert and oriented to person, place, and time.  Skin: No erythema.    Psychiatric: He has a normal mood and affect. His behavior is normal. Judgment and thought content normal.    Results for orders placed or performed in visit on 01/27/17  CBC with Differential/Platelet  Result Value Ref Range   WBC 6.4 3.4 - 10.8 x10E3/uL   RBC 4.24 4.14 - 5.80 x10E6/uL   Hemoglobin 12.8 (L) 13.0 - 17.7 g/dL   Hematocrit 40.937.9 81.137.5 - 51.0 %   MCV 89 79 - 97 fL   MCH 30.2 26.6 - 33.0 pg   MCHC 33.8 31.5 - 35.7 g/dL   RDW 91.414.4 78.212.3 - 95.615.4 %   Platelets 246 150 - 379 x10E3/uL   Neutrophils 55 Not Estab. %   Lymphs 29 Not Estab. %   Monocytes 11 Not Estab. %   Eos 3 Not Estab. %   Basos 1 Not Estab. %   Neutrophils Absolute 3.5 1.4 - 7.0 x10E3/uL   Lymphocytes Absolute 1.8 0.7 - 3.1 x10E3/uL   Monocytes Absolute 0.7 0.1 - 0.9 x10E3/uL   EOS (ABSOLUTE) 0.2 0.0 - 0.4 x10E3/uL   Basophils Absolute 0.1 0.0 - 0.2 x10E3/uL   Immature Granulocytes 1 Not Estab. %   Immature Grans (Abs) 0.1 0.0 - 0.1 x10E3/uL      Assessment & Plan:   Problem List Items Addressed This Visit      Cardiovascular and Mediastinum   Essential hypertension  The current medical regimen is effective;  continue present plan and medications.       Relevant Medications   aspirin EC 81 MG tablet   Other Relevant Orders   Bayer DCA Hb A1c Waived   Basic metabolic panel   Urinalysis, Routine w reflex microscopic     Endocrine   DM type 2 with diabetic peripheral neuropathy (HCC) - Primary    The current medical regimen is effective;  continue present plan and medications.       Relevant Medications   aspirin EC 81 MG tablet   Other Relevant Orders   Bayer DCA Hb A1c Waived   Basic metabolic panel   Urinalysis, Routine w reflex microscopic     Genitourinary   UTI (urinary tract infection)    Discuss UTI will start Septra1 twice a day for 5 days  And urology referral.      Relevant Medications   sulfamethoxazole-trimethoprim (BACTRIM DS,SEPTRA DS) 800-160 MG tablet   Other  Relevant Orders   Urine Culture   Ambulatory referral to Urology    Other Visit Diagnoses    UTI symptoms       Relevant Medications   sulfamethoxazole-trimethoprim (BACTRIM DS,SEPTRA DS) 800-160 MG tablet   Other Relevant Orders   Urinalysis, Routine w reflex microscopic       Follow up plan: Return in about 6 months (around 01/26/2018) for Physical Exam, Hemoglobin A1c.

## 2017-07-28 NOTE — Assessment & Plan Note (Signed)
Discuss UTI will start Septra1 twice a day for 5 days  And urology referral.

## 2017-07-29 LAB — BASIC METABOLIC PANEL
BUN / CREAT RATIO: 18 (ref 10–24)
BUN: 21 mg/dL (ref 8–27)
CALCIUM: 9.2 mg/dL (ref 8.6–10.2)
CHLORIDE: 99 mmol/L (ref 96–106)
CO2: 23 mmol/L (ref 20–29)
Creatinine, Ser: 1.17 mg/dL (ref 0.76–1.27)
GFR calc non Af Amer: 64 mL/min/{1.73_m2} (ref >59)
GFR, EST AFRICAN AMERICAN: 74 mL/min/{1.73_m2} (ref >59)
GLUCOSE: 94 mg/dL (ref 65–99)
POTASSIUM: 4.4 mmol/L (ref 3.5–5.2)
Sodium: 138 mmol/L (ref 134–144)

## 2017-07-30 LAB — URINE CULTURE

## 2017-08-02 ENCOUNTER — Encounter: Payer: Self-pay | Admitting: Urology

## 2017-08-02 ENCOUNTER — Ambulatory Visit: Payer: Medicare Other | Admitting: Urology

## 2017-08-02 VITALS — BP 116/74 | HR 118 | Ht 76.0 in | Wt 260.0 lb

## 2017-08-02 DIAGNOSIS — N3000 Acute cystitis without hematuria: Secondary | ICD-10-CM | POA: Diagnosis not present

## 2017-08-02 LAB — URINALYSIS, COMPLETE
Bilirubin, UA: NEGATIVE
Glucose, UA: NEGATIVE
Leukocytes, UA: NEGATIVE
Nitrite, UA: NEGATIVE
PH UA: 5.5 (ref 5.0–7.5)
Specific Gravity, UA: >1.03 — ABNORMAL HIGH (ref 1.005–1.030)
UUROB: 0.2 mg/dL (ref 0.2–1.0)

## 2017-08-02 LAB — MICROSCOPIC EXAMINATION: Epithelial Cells (non renal): NONE SEEN /hpf (ref 0–10)

## 2017-08-02 MED ORDER — DOXYCYCLINE HYCLATE 100 MG PO CAPS: 100.0000 mg | ORAL_CAPSULE | Freq: Two times a day (BID) | ORAL | 0 refills | Status: AC

## 2017-08-02 NOTE — Progress Notes (Signed)
08/02/2017 2:27 PM   Daniel Leon 1951-06-13 [id number]  Referring provider: Steele Sizerrissman, Mark A, MD [address] [place], [place] [address]  Chief Complaint  Patient presents with  . New Patient (Initial Visit)  . Cystitis    HPI: Daniel GrammesMichael Leon is a 66 y.o. male seen at the request of Dr. Dossie Arbourrissman for evaluation of a recent urinary tract infection.  He was seen on 07/28/2017 with a one-week history of urinary frequency, urgency and dysuria.  He denies fever or chills.  He denied previous history of UTI or other urologic problems.  His urine culture did grow pansensitive E. coli and he was treated with a 5-day course of Septra DS.  He had persistent symptoms up until yesterday and states they have now resolved.   PMH: Past Medical History:  Diagnosis Date  . Diabetes mellitus without complication (HCC)    DIET-CONTROLLED  . Essential hypertension   . Right bundle branch block (RBBB)     Surgical History: Past Surgical History:  Procedure Laterality Date  . COLONOSCOPY    . ELBOW SURGERY Right 1966  . TOTAL ANKLE REPLACEMENT    . ULNAR NERVE TRANSPOSITION Right 09/05/2015   Procedure: ULNAR NERVE DECOMPRESSION/TRANSPOSITION;  Surgeon: Myra Rudehristopher Smith, MD;  Location: [facility] ORS;  Service: Orthopedics;  Laterality: Right;    Home Medications:  Allergies as of 08/02/2017   No Known Allergies     Medication List        Accurate as of 08/02/17  2:27 PM. Always use your most recent med list.          aspirin EC 81 MG tablet Take by mouth.   lisinopril 10 MG tablet Commonly known as:  PRINIVIL,ZESTRIL Take 1 tablet (10 mg total) by mouth daily.   multivitamin tablet Take 1 tablet by mouth daily.   vitamin E 200 UNIT capsule Take 200 Units by mouth daily.       Allergies: No Known Allergies  Family History: Family History  Problem Relation Age of Onset  . Heart disease Mother   . Hypertension Mother   . Heart disease Father   . Hypertension  Father   . Hypertension Brother   . Prostate cancer Neg Hx   . Bladder Cancer Neg Hx   . Kidney cancer Neg Hx     Social History:  reports that he quit smoking about 17 years ago. His smoking use included cigarettes. He has a 5.00 pack-year smoking history. he has never used smokeless tobacco. He reports that he drinks alcohol. He reports that he does not use drugs.  ROS: UROLOGY Frequent Urination?: No Hard to postpone urination?: No Burning/pain with urination?: No Get up at night to urinate?: No Leakage of urine?: No Urine stream starts and stops?: No Trouble starting stream?: No Do you have to strain to urinate?: No Blood in urine?: No Urinary tract infection?: No Sexually transmitted disease?: No Injury to kidneys or bladder?: No Painful intercourse?: No Weak stream?: No Erection problems?: No Penile pain?: No  Gastrointestinal Nausea?: No Vomiting?: No Indigestion/heartburn?: No Diarrhea?: No Constipation?: No  Constitutional Fever: No Night sweats?: No Weight loss?: No Fatigue?: No  Skin Skin rash/lesions?: No Itching?: No  Eyes Blurred vision?: No Double vision?: No  Ears/Nose/Throat Sore throat?: No Sinus problems?: No  Hematologic/Lymphatic Swollen glands?: No Easy bruising?: No  Cardiovascular Leg swelling?: No Chest pain?: No  Respiratory Cough?: No Shortness of breath?: No  Endocrine Excessive thirst?: No  Musculoskeletal Back pain?: No Joint  pain?: No  Neurological Headaches?: No Dizziness?: No  Psychologic Depression?: No Anxiety?: No  Physical Exam: BP 116/74 (BP Location: Left Arm, Patient Position: Sitting, Cuff Size: Normal)   Pulse (!) 118   Ht 6\' 4"  (1.93 m)   Wt 260 lb (117.9 kg)   BMI 31.65 kg/m   Constitutional:  Alert and oriented, No acute distress. HEENT: [place] AT, moist mucus membranes.  Trachea midline, no masses. Cardiovascular: No clubbing, cyanosis, or edema. Respiratory: Normal respiratory effort,  no increased work of breathing. GI: Abdomen is soft, nontender, nondistended, no abdominal masses GU: No CVA tenderness.  Prostate 40 g, smooth without nodules Skin: No rashes, bruises or suspicious lesions. Lymph: No cervical or inguinal adenopathy. Neurologic: Grossly intact, no focal deficits, moving all 4 extremities. Psychiatric: Normal mood and affect.  Laboratory Data: Lab Results  Component Value Date   WBC 6.4 01/27/2017   HGB 12.8 (L) 01/27/2017   HCT 37.9 01/27/2017   MCV 89 01/27/2017   PLT 246 01/27/2017    Lab Results  Component Value Date   CREATININE 1.17 07/28/2017    Lab Results  Component Value Date   PSA1 0.8 12/29/2016   PSA1 0.7 12/24/2015     Urinalysis Dipstick: 1+ blood, negative leukocytes Microscopy: 6-10 WBC, 3-10 RBC, few bacteria   Assessment & Plan:   I did discuss with Daniel Leon that the origin of urinary tract infections in men is commonly the prostate.  Since his symptoms just resolved yesterday and he has persistent pyuria would recommend an additional 2-week course of antibiotic therapy to cover the prostate. And Rx doxycycline 100 mg twice daily times 15 days was sent to his pharmacy.  Recommend a follow-up renal ultrasound and urinalysis in approximately 1 month.   1. Acute cystitis without hematuria  - Urinalysis, Complete   Riki AltesScott C Stoioff, MD  [facility] [address], [address] [place], [place] [address] [phone]

## 2017-08-09 ENCOUNTER — Telehealth: Payer: Self-pay | Admitting: Urology

## 2017-08-09 DIAGNOSIS — R3129 Other microscopic hematuria: Secondary | ICD-10-CM

## 2017-08-09 NOTE — Telephone Encounter (Signed)
Microhematuria.  Do I need to reenter the order?

## 2017-08-09 NOTE — Telephone Encounter (Signed)
Per scheduling patient's insurance will not cover the RUS with the current diagnosis code we have listed. They will need another code in order to approve the RUS. Can you give me another code to use please and thank you.  Daniel DusterMichelle

## 2017-08-09 NOTE — Telephone Encounter (Signed)
Yes we will need a new order please

## 2017-08-10 NOTE — Telephone Encounter (Signed)
Order was entered 

## 2017-08-23 HISTORY — PX: FOOT ARTHRODESIS, SUBTALAR: SUR53

## 2017-08-31 ENCOUNTER — Ambulatory Visit
Admission: RE | Admit: 2017-08-31 | Discharge: 2017-08-31 | Disposition: A | Discharge status: Home / Self Care | Payer: Medicare Other | Source: Ambulatory Visit | Attending: Urology | Admitting: Urology

## 2017-08-31 DIAGNOSIS — R3129 Other microscopic hematuria: Secondary | ICD-10-CM

## 2017-08-31 DIAGNOSIS — R160 Hepatomegaly, not elsewhere classified: Secondary | ICD-10-CM | POA: Insufficient documentation

## 2017-09-08 ENCOUNTER — Ambulatory Visit: Payer: Medicare Other | Admitting: Urology

## 2017-09-08 ENCOUNTER — Encounter: Payer: Self-pay | Admitting: Urology

## 2017-09-08 VITALS — BP 128/56 | HR 105 | Ht 76.0 in | Wt 240.0 lb

## 2017-09-08 DIAGNOSIS — N3000 Acute cystitis without hematuria: Secondary | ICD-10-CM | POA: Diagnosis not present

## 2017-09-08 LAB — MICROSCOPIC EXAMINATION
Bacteria, UA: NONE SEEN
EPITHELIAL CELLS (NON RENAL): NONE SEEN /HPF (ref 0–10)

## 2017-09-08 LAB — URINALYSIS, COMPLETE
Bilirubin, UA: NEGATIVE
Glucose, UA: NEGATIVE
LEUKOCYTES UA: NEGATIVE
NITRITE UA: NEGATIVE
PH UA: 5.5 (ref 5.0–7.5)
Protein, UA: NEGATIVE
Specific Gravity, UA: 1.025 (ref 1.005–1.030)
Urobilinogen, Ur: 0.2 mg/dL (ref 0.2–1.0)

## 2017-09-08 NOTE — Progress Notes (Signed)
09/08/2017 2:49 PM   Daniel Leon 08/20/1951 [id number]  Referring provider: Steele Sizerrissman, Mark A, MD [address] [place], [place] [address]  Chief Complaint  Patient presents with  . Cystitis    4 wk recheck    HPI: 67 year-old male initially seen 08/02/2017 for Leon E. coli UTI most likely secondary to prostatitis.  He was treated with an  extended antibiotic course and Leon renal ultrasound was recommended.  He has no complaints today and is back to baseline.   PMH: Past Medical History:  Diagnosis Date  . Diabetes mellitus without complication (HCC)    DIET-CONTROLLED  . Essential hypertension   . Right bundle branch block (RBBB)     Surgical History: Past Surgical History:  Procedure Laterality Date  . COLONOSCOPY    . ELBOW SURGERY Right 1966  . TOTAL ANKLE REPLACEMENT    . ULNAR NERVE TRANSPOSITION Right 09/05/2015   Procedure: ULNAR NERVE DECOMPRESSION/TRANSPOSITION;  Surgeon: Daniel Rudehristopher Smith, MD;  Location: [facility] ORS;  Service: Orthopedics;  Laterality: Right;    Home Medications:  Allergies as of 09/08/2017   No Known Allergies     Medication List        Accurate as of 09/08/17  2:49 PM. Always use your most recent med list.          aspirin EC 81 MG tablet Take by mouth.   lisinopril 10 MG tablet Commonly known as:  PRINIVIL,ZESTRIL Take 1 tablet (10 mg total) by mouth daily.   multivitamin tablet Take 1 tablet by mouth daily.   vitamin E 200 UNIT capsule Take 200 Units by mouth daily.       Allergies: No Known Allergies  Family History: Family History  Problem Relation Age of Onset  . Heart disease Mother   . Hypertension Mother   . Heart disease Father   . Hypertension Father   . Hypertension Brother   . Prostate cancer Neg Hx   . Bladder Cancer Neg Hx   . Kidney cancer Neg Hx     Social History:  reports that he quit smoking about 17 years ago. His smoking use included cigarettes. He has Leon 5.00 pack-year smoking history. he  has never used smokeless tobacco. He reports that he drinks alcohol. He reports that he does not use drugs.  ROS: UROLOGY Frequent Urination?: No Hard to postpone urination?: No Burning/pain with urination?: No Get up at night to urinate?: No Leakage of urine?: No Urine stream starts and stops?: No Trouble starting stream?: No Do you have to strain to urinate?: No Blood in urine?: No Urinary tract infection?: No Sexually transmitted disease?: No Injury to kidneys or bladder?: No Painful intercourse?: No Weak stream?: No Erection problems?: No Penile pain?: No  Gastrointestinal Nausea?: No Vomiting?: No Indigestion/heartburn?: No Diarrhea?: No Constipation?: No  Constitutional Fever: No Night sweats?: No Weight loss?: No Fatigue?: No  Skin Skin rash/lesions?: No Itching?: No  Eyes Blurred vision?: No Double vision?: No  Ears/Nose/Throat Sore throat?: No Sinus problems?: No  Hematologic/Lymphatic Swollen glands?: No Easy bruising?: No  Cardiovascular Leg swelling?: No Chest pain?: No  Respiratory Cough?: No Shortness of breath?: No  Endocrine Excessive thirst?: No  Musculoskeletal Back pain?: No Joint pain?: No  Neurological Headaches?: No Dizziness?: No  Psychologic Depression?: No Anxiety?: No  Physical Exam: BP (!) 128/56   Pulse (!) 105   Ht 6\' 4"  (1.93 m)   Wt 240 lb (108.9 kg)   BMI 29.21 kg/m  Constitutional:  Alert and oriented, No acute distress. HEENT: [place] AT, moist mucus membranes.  Trachea midline, no masses. Cardiovascular: No clubbing, cyanosis, or edema. Respiratory: Normal respiratory effort, no increased work of breathing. GI: Abdomen is soft, nontender, nondistended, no abdominal masses GU: No CVA tenderness.  Skin: No rashes, bruises or suspicious lesions. Lymph: No cervical or inguinal adenopathy. Neurologic: Grossly intact, no focal deficits, moving all 4 extremities. Psychiatric: Normal mood and  affect.  Laboratory Data: Lab Results  Component Value Date   WBC 6.4 01/27/2017   HGB 12.8 (L) 01/27/2017   HCT 37.9 01/27/2017   MCV 89 01/27/2017   PLT 246 01/27/2017    Lab Results  Component Value Date   CREATININE 1.17 07/28/2017    Lab Results  Component Value Date   PSA1 0.8 12/29/2016   PSA1 0.7 12/24/2015    Pertinent Imaging:   Results for orders placed during the hospital encounter of 08/31/17  Ultrasound renal complete   Narrative CLINICAL DATA:  Microscopic hematuria.  EXAM: RENAL / URINARY TRACT ULTRASOUND COMPLETE  COMPARISON:  None.  FINDINGS: Right Kidney:  Length: 12.8 cm. Echogenicity within normal limits. No mass or hydronephrosis visualized.  Left Kidney:  Length: 12.8 cm. Echogenicity within normal limits. No mass or hydronephrosis visualized.  Bladder:  Appears normal for degree of bladder distention.  Incidental finding: Subcapsular hypoechoic lobulated mass within the right lobe of the liver measuring 2.4 x 2.5 x 2.3 cm. No internal blood flow was documented.  IMPRESSION: Normal appearance of the bilateral kidneys and urinary bladder.  Incidental finding of 2.5 cm probably benign liver cyst. Follow-up in 6-12 months may be considered with dedicated ultrasound.   Electronically Signed   By: Daniel Leon M.D.   On: 08/31/2017 22:17      Assessment & Plan:  He is presently asymptomatic.  Urinalysis today showed no significant abnormalities on microscopy.  Renal ultrasound showed normal-appearing bladder and kidneys.  He was incidentally noted to have Leon 2.5 cm liver mass felt to be Leon benign liver cyst.  Radiology mention that follow-up in 6-12 months could be considered with Leon dedicated ultrasound.  Will alert Dr. Dossie Leon.  He may follow-up as needed.  - Urinalysis, Complete   Return if symptoms worsen or fail to improve.  Daniel Altes, MD  [facility] [address] [place], [place] [address] [phone]

## 2017-09-15 ENCOUNTER — Encounter: Payer: Self-pay | Admitting: Urology

## 2017-10-11 ENCOUNTER — Encounter: Payer: Self-pay | Admitting: Family Medicine

## 2017-10-20 ENCOUNTER — Telehealth: Payer: Self-pay | Admitting: Family Medicine

## 2017-10-20 DIAGNOSIS — I10 Essential (primary) hypertension: Secondary | ICD-10-CM

## 2017-10-20 MED ORDER — LISINOPRIL 10 MG PO TABS: 10.0000 mg | ORAL_TABLET | Freq: Every day | ORAL | 4 refills | Status: DC

## 2017-10-20 NOTE — Telephone Encounter (Signed)
Copied from CRM [id number]. Topic: Quick Communication - Rx Refill/Question >> Oct 20, 2017  2:15 PM Breuna Loveall, Tresa EndoKelly, [place] wrote: Medication: Lininopril 10mg    Has the patient contacted their pharmacy? No  Patient needs a refill on his above medication. Has not been in contact with pharmacy due to the fact he is switching pharmacy. Stated that he would like to have a 90 supply of medication  Preferred Pharmacy (with phone number or street name): [facility] [address] [phone]   Agent: Please be advised that RX refills may take up to 3 business days. We ask that you follow-up with your pharmacy.

## 2017-10-20 NOTE — Telephone Encounter (Signed)
LOV 08/07/17 with Dr. Dossie Arbourrissman / Refill request for Lisinopril / Patient has refills.  Patient needs to call the pharmacy and have the prescription transferred. /  Disp Refills Start End   lisinopril (PRINIVIL,ZESTRIL) 10 MG tablet 90 tablet 4 12/29/2016    / Called patient and he states [facility] is completely out of business, they have a sign that says they are closed, and they do not answer the phone. / I will go ahead and sent prescription to [facility].

## 2017-12-13 ENCOUNTER — Encounter: Payer: Self-pay | Admitting: Family Medicine

## 2018-01-03 ENCOUNTER — Encounter: Payer: Medicare Other | Admitting: Family Medicine

## 2018-01-17 ENCOUNTER — Other Ambulatory Visit: Payer: Self-pay | Admitting: Orthopedic Surgery

## 2018-01-17 DIAGNOSIS — M14672 Charcot's joint, left ankle and foot: Secondary | ICD-10-CM

## 2018-01-20 ENCOUNTER — Ambulatory Visit
Admission: RE | Admit: 2018-01-20 | Discharge: 2018-01-20 | Disposition: A | Discharge status: Home / Self Care | Payer: Medicare Other | Source: Ambulatory Visit | Attending: Orthopedic Surgery | Admitting: Orthopedic Surgery

## 2018-01-20 DIAGNOSIS — M14672 Charcot's joint, left ankle and foot: Secondary | ICD-10-CM

## 2018-01-20 DIAGNOSIS — X58XXXA Exposure to other specified factors, initial encounter: Secondary | ICD-10-CM | POA: Insufficient documentation

## 2018-01-20 DIAGNOSIS — M25472 Effusion, left ankle: Secondary | ICD-10-CM | POA: Insufficient documentation

## 2018-01-20 DIAGNOSIS — T84038A Mechanical loosening of other internal prosthetic joint, initial encounter: Secondary | ICD-10-CM | POA: Insufficient documentation

## 2018-01-26 ENCOUNTER — Ambulatory Visit (INDEPENDENT_AMBULATORY_CARE_PROVIDER_SITE_OTHER): Payer: Medicare Other | Admitting: Family Medicine

## 2018-01-26 ENCOUNTER — Encounter: Payer: Self-pay | Admitting: Family Medicine

## 2018-01-26 DIAGNOSIS — M14672 Charcot's joint, left ankle and foot: Secondary | ICD-10-CM

## 2018-01-26 DIAGNOSIS — I10 Essential (primary) hypertension: Secondary | ICD-10-CM

## 2018-01-26 DIAGNOSIS — E11610 Type 2 diabetes mellitus with diabetic neuropathic arthropathy: Secondary | ICD-10-CM | POA: Insufficient documentation

## 2018-01-26 DIAGNOSIS — Z7189 Other specified counseling: Secondary | ICD-10-CM

## 2018-01-26 DIAGNOSIS — Z0000 Encounter for general adult medical examination without abnormal findings: Secondary | ICD-10-CM

## 2018-01-26 DIAGNOSIS — N400 Enlarged prostate without lower urinary tract symptoms: Secondary | ICD-10-CM

## 2018-01-26 DIAGNOSIS — E1142 Type 2 diabetes mellitus with diabetic polyneuropathy: Secondary | ICD-10-CM | POA: Diagnosis not present

## 2018-01-26 LAB — URINALYSIS, ROUTINE W REFLEX MICROSCOPIC
Bilirubin, UA: NEGATIVE
GLUCOSE, UA: NEGATIVE
Ketones, UA: NEGATIVE
Leukocytes, UA: NEGATIVE
NITRITE UA: NEGATIVE
PH UA: 5.5 (ref 5.0–7.5)
Protein, UA: NEGATIVE
Specific Gravity, UA: 1.02 (ref 1.005–1.030)
UUROB: 0.2 mg/dL (ref 0.2–1.0)

## 2018-01-26 LAB — MICROSCOPIC EXAMINATION: Bacteria, UA: NONE SEEN

## 2018-01-26 MED ORDER — LISINOPRIL 10 MG PO TABS: 10.0000 mg | ORAL_TABLET | Freq: Every day | ORAL | 4 refills | Status: DC

## 2018-01-26 NOTE — Assessment & Plan Note (Signed)
A voluntary discussion about advanced care planning including explanation and discussion of advanced directives was extentively discussed with the patient.  Explained about the healthcare proxy and living will was reviewed and packet with forms with expiration of how to fill them out was given.  Time spent: Encounter 16+ min individuals present: Patient 

## 2018-01-26 NOTE — Assessment & Plan Note (Signed)
The current medical regimen is effective;  continue present plan and medications.  

## 2018-01-26 NOTE — Assessment & Plan Note (Signed)
Just had surgery with resulting marked lymphedema. Reviewed lymphedema care and treatment importance of controlling lymphedema for healing.  Discussed compression hose if not successful with compression hose will refer to lymphedema clinic.

## 2018-01-26 NOTE — Assessment & Plan Note (Addendum)
Stable no medications but worsening neuropathy. Patient symptoms are primarily just numbness there is no pain there is occasional tingling.

## 2018-01-26 NOTE — Assessment & Plan Note (Signed)
Stable

## 2018-01-26 NOTE — Progress Notes (Signed)
BP 139/71 (BP Location: Left Arm, Patient Position: Sitting, Cuff Size: Normal)   Pulse 80   Temp 97.9 F (36.6 C) (Oral)   Ht 6' 3.63" (1.921 m)   Wt 257 lb 6.4 oz (116.8 kg)   SpO2 98%   BMI 31.64 kg/m    Subjective:    Patient ID: Daniel Leon, male    DOB: 09-07-1950, 67 y.o.   MRN: [id number]  HPI: Daniel Leon is a 67 y.o. male  Chief Complaint  Patient presents with  . Annual Exam  Patient status post surgery on left ankle foot for Charcot foot diabetic peripheral neuropathy which is getting somewhat worse.  Foot is healing very slowly still wearing walking boot.  Filled out forms for diabetic shoe. Today counts as face-to-face visit for diabetic shoe prescription. Blood pressure doing well today no complaints from lisinopril 10 mg. Diabetes remains diet controlled A1c last done 6 months ago was 5.9 and patient for over a year has been 6 or less.  Diabetes been consistently well controlled with diet.   Relevant past medical, surgical, family and social history reviewed and updated as indicated. Interim medical history since our last visit reviewed. Allergies and medications reviewed and updated.  Review of Systems  Constitutional: Negative.   HENT: Negative.   Eyes: Negative.   Respiratory: Negative.   Cardiovascular: Negative.   Gastrointestinal: Negative.   Endocrine: Negative.   Genitourinary: Negative.   Musculoskeletal: Negative.   Skin: Negative.   Allergic/Immunologic: Negative.   Neurological: Negative.   Hematological: Negative.   Psychiatric/Behavioral: Negative.     Per HPI unless specifically indicated above     Objective:    BP 139/71 (BP Location: Left Arm, Patient Position: Sitting, Cuff Size: Normal)   Pulse 80   Temp 97.9 F (36.6 C) (Oral)   Ht 6' 3.63" (1.921 m)   Wt 257 lb 6.4 oz (116.8 kg)   SpO2 98%   BMI 31.64 kg/m   Wt Readings from Last 3 Encounters:  01/26/18 257 lb 6.4 oz (116.8 kg)  09/08/17 240 lb (108.9  kg)  08/02/17 260 lb (117.9 kg)    Physical Exam  Constitutional: He is oriented to person, place, and time. He appears well-developed and well-nourished.  HENT:  Head: Normocephalic and atraumatic.  Right Ear: External ear normal.  Left Ear: External ear normal.  Eyes: Pupils are equal, round, and reactive to light. Conjunctivae and EOM are normal.  Neck: Normal range of motion. Neck supple.  Cardiovascular: Normal rate, regular rhythm, normal heart sounds and intact distal pulses.  Pulmonary/Chest: Effort normal and breath sounds normal.  Abdominal: Soft. Bowel sounds are normal. There is no splenomegaly or hepatomegaly.  Genitourinary: Rectum normal, prostate normal and penis normal.  Musculoskeletal: Normal range of motion.  Neurological: He is alert and oriented to person, place, and time. He has normal reflexes.  Skin: No rash noted. No erythema.  Psychiatric: He has a normal mood and affect. His behavior is normal. Judgment and thought content normal.    Results for orders placed or performed in visit on 09/08/17  Microscopic Examination  Result Value Ref Range   WBC, UA 0-5 0 - 5 /hpf   RBC, UA 0-2 0 - 2 /hpf   Epithelial Cells (non renal) None seen 0 - 10 /hpf   Casts Present (A) None seen /lpf   Cast Type Hyaline casts N/A   Mucus, UA Present (A) Not Estab.   Bacteria, UA None seen None  seen/Few  Urinalysis, Complete  Result Value Ref Range   Specific Gravity, UA 1.025 1.005 - 1.030   pH, UA 5.5 5.0 - 7.5   Color, UA Yellow Yellow   Appearance Ur Clear Clear   Leukocytes, UA Negative Negative   Protein, UA Negative Negative/Trace   Glucose, UA Negative Negative   Ketones, UA Trace (A) Negative   RBC, UA Trace (A) Negative   Bilirubin, UA Negative Negative   Urobilinogen, Ur 0.2 0.2 - 1.0 mg/dL   Nitrite, UA Negative Negative   Microscopic Examination See below:       Assessment & Plan:   Problem List Items Addressed This Visit      Cardiovascular and  Mediastinum   Essential hypertension    The current medical regimen is effective;  continue present plan and medications.       Relevant Medications   lisinopril (PRINIVIL,ZESTRIL) 10 MG tablet   Other Relevant Orders   Comprehensive metabolic panel   Lipid panel   CBC with Differential/Platelet   TSH   Urinalysis, Routine w reflex microscopic     Endocrine   DM type 2 with diabetic peripheral neuropathy (HCC)    Stable no medications but worsening neuropathy. Patient symptoms are primarily just numbness there is no pain there is occasional tingling.      Relevant Medications   lisinopril (PRINIVIL,ZESTRIL) 10 MG tablet   Other Relevant Orders   Comprehensive metabolic panel   Lipid panel   CBC with Differential/Platelet   TSH   Urinalysis, Routine w reflex microscopic     Musculoskeletal and Integument   Charcot's joint of foot    Just had surgery with resulting marked lymphedema. Reviewed lymphedema care and treatment importance of controlling lymphedema for healing.  Discussed compression hose if not successful with compression hose will refer to lymphedema clinic.      Relevant Orders   Comprehensive metabolic panel   Lipid panel   CBC with Differential/Platelet   TSH   Urinalysis, Routine w reflex microscopic     Genitourinary   BPH (benign prostatic hyperplasia)    Stable       Relevant Orders   PSA     Other   Advanced care planning/counseling discussion    A voluntary discussion about advanced care planning including explanation and discussion of advanced directives was extentively discussed with the patient.  Explained about the healthcare proxy and living will was reviewed and packet with forms with expiration of how to fill them out was given.  Time spent: Encounter 16+ min individuals present: Patient          Follow up plan: Return in about 1 month (around 02/23/2018) for recheck lymphadema.

## 2018-01-27 LAB — COMPREHENSIVE METABOLIC PANEL
A/G RATIO: 1.1 — AB (ref 1.2–2.2)
ALK PHOS: 95 IU/L (ref 39–117)
ALT: 18 IU/L (ref 0–44)
AST: 19 IU/L (ref 0–40)
Albumin: 4 g/dL (ref 3.6–4.8)
BILIRUBIN TOTAL: 0.3 mg/dL (ref 0.0–1.2)
BUN/Creatinine Ratio: 19 (ref 10–24)
BUN: 24 mg/dL (ref 8–27)
CHLORIDE: 103 mmol/L (ref 96–106)
CO2: 21 mmol/L (ref 20–29)
Calcium: 9.4 mg/dL (ref 8.6–10.2)
Creatinine, Ser: 1.28 mg/dL — ABNORMAL HIGH (ref 0.76–1.27)
GFR calc Af Amer: 66 mL/min/{1.73_m2} (ref >59)
GFR calc non Af Amer: 57 mL/min/{1.73_m2} — ABNORMAL LOW (ref >59)
GLOBULIN, TOTAL: 3.5 g/dL (ref 1.5–4.5)
Glucose: 83 mg/dL (ref 65–99)
POTASSIUM: 4.7 mmol/L (ref 3.5–5.2)
SODIUM: 140 mmol/L (ref 134–144)
Total Protein: 7.5 g/dL (ref 6.0–8.5)

## 2018-01-27 LAB — CBC WITH DIFFERENTIAL/PLATELET
BASOS: 1 %
Basophils Absolute: 0 10*3/uL (ref 0.0–0.2)
EOS (ABSOLUTE): 0.2 10*3/uL (ref 0.0–0.4)
EOS: 2 %
HEMATOCRIT: 33.4 % — AB (ref 37.5–51.0)
Hemoglobin: 11 g/dL — ABNORMAL LOW (ref 13.0–17.7)
IMMATURE GRANS (ABS): 0 10*3/uL (ref 0.0–0.1)
Immature Granulocytes: 1 %
LYMPHS ABS: 1.5 10*3/uL (ref 0.7–3.1)
LYMPHS: 24 %
MCH: 27.8 pg (ref 26.6–33.0)
MCHC: 32.9 g/dL (ref 31.5–35.7)
MCV: 84 fL (ref 79–97)
MONOCYTES: 12 %
Monocytes Absolute: 0.8 10*3/uL (ref 0.1–0.9)
NEUTROS ABS: 3.8 10*3/uL (ref 1.4–7.0)
Neutrophils: 60 %
Platelets: 313 10*3/uL (ref 150–450)
RBC: 3.96 x10E6/uL — ABNORMAL LOW (ref 4.14–5.80)
RDW: 15.4 % (ref 12.3–15.4)
WBC: 6.3 10*3/uL (ref 3.4–10.8)

## 2018-01-27 LAB — LIPID PANEL
CHOLESTEROL TOTAL: 152 mg/dL (ref 100–199)
Chol/HDL Ratio: 4.3 ratio (ref 0.0–5.0)
HDL: 35 mg/dL — AB (ref >39)
LDL Calculated: 90 mg/dL (ref 0–99)
TRIGLYCERIDES: 135 mg/dL (ref 0–149)
VLDL Cholesterol Cal: 27 mg/dL (ref 5–40)

## 2018-01-27 LAB — TSH: TSH: 0.786 u[IU]/mL (ref 0.450–4.500)

## 2018-01-27 LAB — PSA: Prostate Specific Ag, Serum: 1.2 ng/mL (ref 0.0–4.0)

## 2018-01-30 ENCOUNTER — Telehealth: Payer: Self-pay | Admitting: Family Medicine

## 2018-01-30 NOTE — Telephone Encounter (Signed)
Phone call Discussed with patient mild anemia postoperative will recheck CBC next office visit.

## 2018-01-30 NOTE — Telephone Encounter (Signed)
-----   Message from Richarda OverlieJada A [place], [place] sent at 01/30/2018  5:03 PM EDT ----- Patient was transferred to provider for telephone conversation.

## 2018-02-26 ENCOUNTER — Encounter: Payer: Self-pay | Admitting: Family Medicine

## 2018-02-28 ENCOUNTER — Ambulatory Visit: Payer: Medicare Other | Admitting: Family Medicine

## 2018-02-28 ENCOUNTER — Encounter: Payer: Self-pay | Admitting: Family Medicine

## 2018-02-28 DIAGNOSIS — I10 Essential (primary) hypertension: Secondary | ICD-10-CM

## 2018-02-28 DIAGNOSIS — E1142 Type 2 diabetes mellitus with diabetic polyneuropathy: Secondary | ICD-10-CM | POA: Diagnosis not present

## 2018-02-28 DIAGNOSIS — M14672 Charcot's joint, left ankle and foot: Secondary | ICD-10-CM | POA: Diagnosis not present

## 2018-02-28 NOTE — Assessment & Plan Note (Signed)
Doing well with diet control minimal neuropathy symptoms patient education given on neuropathy care and treatment

## 2018-02-28 NOTE — Assessment & Plan Note (Signed)
The current medical regimen is effective;  continue present plan and medications.  

## 2018-02-28 NOTE — Assessment & Plan Note (Signed)
Discussed Charcot foot with edema continue support hose

## 2018-02-28 NOTE — Progress Notes (Addendum)
BP 114/62   Pulse 100   Wt 252 lb (114.3 kg)   SpO2 98%   BMI 30.98 kg/m    Subjective:    Patient ID: Daniel Leon, male    DOB: Aug 10, 1951, 67 y.o.   MRN: [id number]  HPI: Daniel Leon is a 67 y.o. male  Chief Complaint  Patient presents with  . Follow-up   Patient follow-up with lymphedema slow healing of foot surgery.  Patient's been wearing support hose with significant improvement leg healing foot healing.  Patient's leg foot is completely back to normal each morning.  Is now able to walk but not full weight. Blood pressure good control Diabetes diet controlled and doing well.  Relevant past medical, surgical, family and social history reviewed and updated as indicated. Interim medical history since our last visit reviewed. Allergies and medications reviewed and updated.  Review of Systems  Constitutional: Negative.   Respiratory: Negative.   Cardiovascular: Negative.     Per HPI unless specifically indicated above     Objective:    BP 114/62   Pulse 100   Wt 252 lb (114.3 kg)   SpO2 98%   BMI 30.98 kg/m   Wt Readings from Last 3 Encounters:  02/28/18 252 lb (114.3 kg)  01/26/18 257 lb 6.4 oz (116.8 kg)  09/08/17 240 lb (108.9 kg)    Physical Exam  Constitutional: He is oriented to person, place, and time. He appears well-developed and well-nourished.  HENT:  Head: Normocephalic and atraumatic.  Eyes: Conjunctivae and EOM are normal.  Neck: Normal range of motion.  Cardiovascular: Normal rate, regular rhythm and normal heart sounds.  Pulmonary/Chest: Effort normal and breath sounds normal.  Musculoskeletal: Normal range of motion.  Neurological: He is alert and oriented to person, place, and time.  Skin: No erythema.  Psychiatric: He has a normal mood and affect. His behavior is normal. Judgment and thought content normal.    Results for orders placed or performed in visit on 01/26/18  Microscopic Examination  Result Value Ref Range     WBC, UA 0-5 0 - 5 /hpf   RBC, UA 0-2 0 - 2 /hpf   Epithelial Cells (non renal) 0-10 0 - 10 /hpf   Bacteria, UA None seen None seen/Few  Comprehensive metabolic panel  Result Value Ref Range   Glucose 83 65 - 99 mg/dL   BUN 24 8 - 27 mg/dL   Creatinine, Ser 4.09 (H) 0.76 - 1.27 mg/dL   GFR calc non Af Amer 57 (L) >59 mL/min/1.73   GFR calc Af Amer 66 >59 mL/min/1.73   BUN/Creatinine Ratio 19 10 - 24   Sodium 140 134 - 144 mmol/L   Potassium 4.7 3.5 - 5.2 mmol/L   Chloride 103 96 - 106 mmol/L   CO2 21 20 - 29 mmol/L   Calcium 9.4 8.6 - 10.2 mg/dL   Total Protein 7.5 6.0 - 8.5 g/dL   Albumin 4.0 3.6 - 4.8 g/dL   Globulin, Total 3.5 1.5 - 4.5 g/dL   Albumin/Globulin Ratio 1.1 (L) 1.2 - 2.2   Bilirubin Total 0.3 0.0 - 1.2 mg/dL   Alkaline Phosphatase 95 39 - 117 IU/L   AST 19 0 - 40 IU/L   ALT 18 0 - 44 IU/L  Lipid panel  Result Value Ref Range   Cholesterol, Total 152 100 - 199 mg/dL   Triglycerides 811 0 - 149 mg/dL   HDL 35 (L) >91 mg/dL   VLDL Cholesterol Cal  27 5 - 40 mg/dL   LDL Calculated 90 0 - 99 mg/dL   Chol/HDL Ratio 4.3 0.0 - 5.0 ratio  CBC with Differential/Platelet  Result Value Ref Range   WBC 6.3 3.4 - 10.8 x10E3/uL   RBC 3.96 (L) 4.14 - 5.80 x10E6/uL   Hemoglobin 11.0 (L) 13.0 - 17.7 g/dL   Hematocrit 16.133.4 (L) 09.637.5 - 51.0 %   MCV 84 79 - 97 fL   MCH 27.8 26.6 - 33.0 pg   MCHC 32.9 31.5 - 35.7 g/dL   RDW 04.515.4 40.912.3 - 81.115.4 %   Platelets 313 150 - 450 x10E3/uL   Neutrophils 60 Not Estab. %   Lymphs 24 Not Estab. %   Monocytes 12 Not Estab. %   Eos 2 Not Estab. %   Basos 1 Not Estab. %   Neutrophils Absolute 3.8 1.4 - 7.0 x10E3/uL   Lymphocytes Absolute 1.5 0.7 - 3.1 x10E3/uL   Monocytes Absolute 0.8 0.1 - 0.9 x10E3/uL   EOS (ABSOLUTE) 0.2 0.0 - 0.4 x10E3/uL   Basophils Absolute 0.0 0.0 - 0.2 x10E3/uL   Immature Granulocytes 1 Not Estab. %   Immature Grans (Abs) 0.0 0.0 - 0.1 x10E3/uL  TSH  Result Value Ref Range   TSH 0.786 0.450 - 4.500 uIU/mL   Urinalysis, Routine w reflex microscopic  Result Value Ref Range   Specific Gravity, UA 1.020 1.005 - 1.030   pH, UA 5.5 5.0 - 7.5   Color, UA Yellow Yellow   Appearance Ur Clear Clear   Leukocytes, UA Negative Negative   Protein, UA Negative Negative/Trace   Glucose, UA Negative Negative   Ketones, UA Negative Negative   RBC, UA Trace (A) Negative   Bilirubin, UA Negative Negative   Urobilinogen, Ur 0.2 0.2 - 1.0 mg/dL   Nitrite, UA Negative Negative   Microscopic Examination See below:   PSA  Result Value Ref Range   Prostate Specific Ag, Serum 1.2 0.0 - 4.0 ng/mL      Assessment & Plan:   Problem List Items Addressed This Visit      Cardiovascular and Mediastinum   Essential hypertension    The current medical regimen is effective;  continue present plan and medications.         Endocrine   DM type 2 with diabetic peripheral neuropathy (HCC)    Doing well with diet control minimal neuropathy symptoms patient education given on neuropathy care and treatment        Musculoskeletal and Integument   Charcot's joint of foot    Discussed Charcot foot with edema continue support hose       Addendum, Because of this foot surgery patient will need diabetic shoes which will be ordered. Another addendum patient had foot surgery because of diabetes.  He needs diabetic shoes because of diabetes and problems associated with diabetes.  Follow up plan: Return in about 6 months (around 08/31/2018) for Hemoglobin A1c, BMP.

## 2018-03-06 ENCOUNTER — Telehealth: Payer: Self-pay | Admitting: Family Medicine

## 2018-03-06 NOTE — Telephone Encounter (Signed)
Please advise 

## 2018-03-06 NOTE — Telephone Encounter (Signed)
[facility] [phone] and fax [phone].Marland Kitchen. Patient states that he is needing diabetic shoes but per [facility] there is nothing in the note stating this. She said if Dr Dossie Arbourrissman would put an addendum in the last note and fax it they could use that.  Ph [phone] Fax [phone]   Attn: Alvis Lemmingsawn  Thank You

## 2018-03-07 NOTE — Telephone Encounter (Signed)
[facility] at [facility] is calling foot surgery will not work to cover diabetic shoes. The md can said pt is diabetic and is in need of diabetic shoes. Phone number [phone] and fax [phone]

## 2018-03-07 NOTE — Telephone Encounter (Signed)
Note was printed and faxed after addendum was made. Documentation not sufficient for insurance coverage. "Dawn at [facility] is calling foot surgery will not work to cover diabetic shoes. The md can said pt is diabetic and is in need of diabetic shoes. Phone number [phone] and fax [phone]"

## 2018-03-07 NOTE — Telephone Encounter (Signed)
The note has been addended its date is 7 9

## 2018-03-07 NOTE — Telephone Encounter (Signed)
See note. Please advise thanks

## 2018-03-07 NOTE — Telephone Encounter (Signed)
[facility]

## 2018-03-07 NOTE — Telephone Encounter (Signed)
His note is addended you can print and send

## 2018-03-07 NOTE — Telephone Encounter (Signed)
Please advise 

## 2018-03-08 NOTE — Telephone Encounter (Signed)
OV notes [facility]

## 2018-03-08 NOTE — Telephone Encounter (Signed)
OK try again

## 2018-03-24 NOTE — Progress Notes (Signed)
Last A1C was 07/30/2017. No A1C at patient's last visit.   Patient needs Diabetic Management (foot exam, A1C) before scheduled appointment with Dr. Dossie Arbourrissman 09/07/2018.

## 2018-04-12 ENCOUNTER — Ambulatory Visit: Payer: Self-pay

## 2018-04-19 NOTE — Progress Notes (Signed)
Needs A1C, Eye Exam, Foot Exam

## 2018-04-20 ENCOUNTER — Ambulatory Visit (INDEPENDENT_AMBULATORY_CARE_PROVIDER_SITE_OTHER): Payer: Medicare Other

## 2018-04-20 VITALS — BP 138/72 | HR 94 | Temp 98.0°F | Resp 16 | Ht 75.0 in | Wt 254.6 lb

## 2018-04-20 DIAGNOSIS — Z0000 Encounter for general adult medical examination without abnormal findings: Secondary | ICD-10-CM | POA: Diagnosis not present

## 2018-04-20 NOTE — Progress Notes (Signed)
Subjective:   Daniel Leon is a 67 y.o. male who presents for Medicare Annual/Subsequent preventive examination.  Review of Systems:   Cardiac Risk Factors include: male gender;hypertension;advanced age (>47men, >58 women);diabetes mellitus;dyslipidemia;smoking/ tobacco exposure     Objective:    Vitals: BP 138/72 (BP Location: Left Arm, Patient Position: Sitting)   Pulse 94   Temp 98 F (36.7 C) (Temporal)   Resp 16   Ht 6\' 3"  (1.905 m)   Wt 254 lb 9.6 oz (115.5 kg)   SpO2 96%   BMI 31.82 kg/m   Body mass index is 31.82 kg/m.  Advanced Directives 04/20/2018 04/07/2017  Does Patient Have a Medical Advance Directive? Yes Yes  Type of Estate agent of [place];Living will Healthcare Power of [place];Living will  Copy of Healthcare Power of Attorney in Chart? No - copy requested No - copy requested    Tobacco Social History   Tobacco Use  Smoking Status Former Smoker  . Packs/day: 0.50  . Years: 10.00  . Pack years: 5.00  . Types: Cigarettes  . Last attempt to quit: 07/27/2000  . Years since quitting: 17.7  Smokeless Tobacco Never Used     Counseling given: Not Answered   Clinical Intake:  Pre-visit preparation completed: Yes  Pain : 0-10 Pain Score: 3  Pain Type: Chronic pain Pain Location: Ankle Pain Orientation: Left Pain Descriptors / Indicators: Aching Pain Onset: More than a month ago Pain Frequency: Intermittent     Nutritional Status: BMI > 30  Obese Nutritional Risks: None Diabetes: Yes CBG done?: No Did pt. bring in CBG monitor from home?: No  How often do you need to have someone help you when you read instructions, pamphlets, or other written materials from your doctor or pharmacy?: 1 - Never What is the last grade level you completed in school?: college  Interpreter Needed?: No  Information entered by :: Daniel Hill,LPN   Past Medical History:  Diagnosis Date  . 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 Charcot's joint of foot   . Diabetes mellitus without complication (HCC)    DIET-CONTROLLED  . Essential hypertension   . Right bundle branch block (RBBB)   . Traumatic arthropathy of ankle and foot    Past Surgical History:  Procedure Laterality Date  . COLONOSCOPY    . ELBOW SURGERY Right 1966  . FOOT ARTHRODESIS, SUBTALAR Right 2019  . TOTAL ANKLE REPLACEMENT    . ULNAR NERVE TRANSPOSITION Right 09/05/2015   Procedure: ULNAR NERVE DECOMPRESSION/TRANSPOSITION;  Surgeon: [name], MD;  Location: [facility] ORS;  Service: Orthopedics;  Laterality: Right;   Family History  Problem Relation Age of Onset  . Heart disease Mother   . Hypertension Mother   . Heart disease Father   . Hypertension Father   . Hypertension Brother   . Prostate cancer Neg Hx   . Bladder Cancer Neg Hx   . Kidney cancer Neg Hx    Social History   Socioeconomic History  . Marital status: Married    Spouse name: Not on file  . Number of children: Not on file  . Years of education: college   . Highest education level: Not on file  Occupational History  . Not on file  Social Needs  . Financial resource strain: Not hard at all  . Food insecurity:    Worry: Never true    Inability: Never true  . Transportation needs:    Medical: No  Non-medical: No  Tobacco Use  . Smoking status: Former Smoker    Packs/day: 0.50    Years: 10.00    Pack years: 5.00    Types: Cigarettes    Last attempt to quit: 07/27/2000    Years since quitting: 17.7  . Smokeless tobacco: Never Used  Substance and Sexual Activity  . Alcohol use: Not Currently    Comment: very little  . Drug use: No  . Sexual activity: Not on file  Lifestyle  . Physical activity:    Days per week: 0 days    Minutes per session: 0 min  . Stress: Not at all  Relationships  . Social connections:    Talks on phone: More than three times a week    Gets together: More than three times a week    Attends religious service:  More than 4 times per year    Active member of club or organization: No    Attends meetings of clubs or organizations: Never    Relationship status: Married  Other Topics Concern  . Not on file  Social History Narrative  . Not on file    Outpatient Encounter Medications as of 04/20/2018  Medication Sig  . aspirin EC 81 MG tablet Take by mouth.  . celecoxib (CELEBREX) 200 MG capsule TK 1 C PO QHS  . gabapentin (NEURONTIN) 300 MG capsule gabapentin 300 mg capsule  1 PO QHS X 7 DAYS, THEN 1 PO BID  . lisinopril (PRINIVIL,ZESTRIL) 10 MG tablet Take 1 tablet (10 mg total) by mouth daily.  . Multiple Vitamin (MULTIVITAMIN) tablet Take 1 tablet by mouth daily.  . vitamin E 200 UNIT capsule Take 200 Units by mouth daily.   No facility-administered encounter medications on file as of 04/20/2018.     Activities of Daily Living In your present state of health, do you have any difficulty performing the following activities: 04/20/2018 01/26/2018  Hearing? N N  Vision? N N  Difficulty concentrating or making decisions? N N  Walking or climbing stairs? N N  Dressing or bathing? N N  Doing errands, shopping? N N  Preparing Food and eating ? N -  Using the Toilet? N -  In the past six months, have you accidently leaked urine? N -  Do you have problems with loss of bowel control? N -  Managing your Medications? N -  Managing your Finances? N -  Housekeeping or managing your Housekeeping? N -  Some recent data might be hidden    Patient Care Team: Steele Sizer, MD as PCP - General (Family Medicine) Winfred Leeds, Basilia Jumbo, MD as Referring Physician (Orthopedic Surgery) Gordan Payment, DO as Referring Physician (Physical Medicine and Rehabilitation)   Assessment:   This is a routine wellness examination for [name].  Exercise Activities and Dietary recommendations Current Exercise Habits: Home exercise routine, Type of exercise: strength training/weights(sitting elipital ), Time (Minutes):  20, Frequency (Times/Week): 5, Weekly Exercise (Minutes/Week): 100, Intensity: Mild, Exercise limited by: None identified  Goals    . DIET - INCREASE WATER INTAKE     Recommend drinking at least 6-8 glasses of water a day        Fall Risk Fall Risk  04/20/2018 02/28/2018 04/07/2017 12/29/2016 12/24/2015  Falls in the past year? No No No No No   Is the patient's home free of loose throw rugs in walkways, pet beds, electrical cords, etc?   yes      Grab bars in the bathroom?  no      Handrails on the stairs?   no stairs       Adequate lighting?   yes  Timed Get Up and Go Performed: Completed in 8 seconds with no use of assistive devices, steady gait. No intervention needed at this time.   Depression Screen PHQ 2/9 Scores 04/20/2018 04/07/2017 12/24/2015 07/28/2015  PHQ - 2 Score 0 0 0 0    Cognitive Function     6CIT Screen 04/20/2018  What Year? 0 points  What month? 0 points  What time? 0 points  Count back from 20 0 points  Months in reverse 0 points  Repeat phrase 2 points  Total Score 2    Immunization History  Administered Date(s) Administered  . Influenza, High Dose Seasonal PF 06/30/2016  . Influenza,inj,Quad PF,6+ Mos 07/28/2015  . Pneumococcal Conjugate-13 12/17/2014  . Pneumococcal Polysaccharide-23 06/30/2016  . Tdap 07/25/2012  . Zoster 06/12/2013    Qualifies for Shingles Vaccine? Yes, discussed shingrix vaccine   Screening Tests Health Maintenance  Topic Date Due  . OPHTHALMOLOGY EXAM  09/02/1959  . HEMOGLOBIN A1C  01/26/2018  . INFLUENZA VACCINE  03/23/2018  . FOOT EXAM  03/01/2019  . TETANUS/TDAP  07/25/2022  . COLONOSCOPY  12/13/2022  . Hepatitis C Screening  Completed  . PNA vac Low Risk Adult  Completed   Cancer Screenings: Lung: Low Dose CT Chest recommended if Age 64-80 years, 30 pack-year currently smoking OR have quit w/in 15years. Patient does not qualify. Colorectal: completed 12/12/2012  Additional Screenings:  Hepatitis C  Screening:completed 12/24/2015      Plan:   I have personally reviewed and addressed the Medicare Annual Wellness questionnaire and have noted the following in the patient's chart:  A. Medical and social history B. Use of alcohol, tobacco or illicit drugs  C. Current medications and supplements D. Functional ability and status E.  Nutritional status F.  Physical activity G. Advance directives H. List of other physicians I.  Hospitalizations, surgeries, and ER visits in previous 12 months J.  Vitals K. Screenings such as hearing and vision if needed, cognitive and depression L. Referrals and appointments   In addition, I have reviewed and discussed with patient certain preventive protocols, quality metrics, and best practice recommendations. A written personalized care plan for preventive services as well as general preventive health recommendations were provided to patient.   Signed,  Marin Robertsiffany Hill, LPN Nurse Health Advisor  Nurse Notes: discussed diabetic eye exam,  He will schedule an appt and have results faxed once completed.

## 2018-04-20 NOTE — Patient Instructions (Addendum)
Daniel Leon , Thank you for taking time to come for your Medicare Wellness Visit. I appreciate your ongoing commitment to your health goals. Please review the following plan we discussed and let me know if I can assist you in the future.   Screening recommendations/referrals: Colonoscopy: completed 12/12/2012 Recommended yearly ophthalmology/optometry visit for glaucoma screening and checkup Recommended yearly dental visit for hygiene and checkup  Vaccinations: Influenza vaccine: due now- declined today  Pneumococcal vaccine: completed series  Tdap vaccine: up to date Shingles vaccine: shingrix eligible, check with your insurance company for coverage     Advanced directives: Please bring a copy of your health care power of attorney and living will to the office at your convenience.  Conditions/risks identified: Recommend drinking at least 6-8 glasses of water a day   Next appointment: Follow up in one year for your annual wellness exam.   Preventive Care 65 Years and Older, Male Preventive care refers to lifestyle choices and visits with your health care provider that can promote health and wellness. What does preventive care include?  A yearly physical exam. This is also called an annual well check.  Dental exams once or twice a year.  Routine eye exams. Ask your health care provider how often you should have your eyes checked.  Personal lifestyle choices, including:  Daily care of your teeth and gums.  Regular physical activity.  Eating a healthy diet.  Avoiding tobacco and drug use.  Limiting alcohol use.  Practicing safe sex.  Taking low doses of aspirin every day.  Taking vitamin and mineral supplements as recommended by your health care provider. What happens during an annual well check? The services and screenings done by your health care provider during your annual well check will depend on your age, overall health, lifestyle risk factors, and family history of  disease. Counseling  Your health care provider may ask you questions about your:  Alcohol use.  Tobacco use.  Drug use.  Emotional well-being.  Home and relationship well-being.  Sexual activity.  Eating habits.  History of falls.  Memory and ability to understand (cognition).  Work and work Astronome[employer]vironment. Screening  You may have the following tests or measurements:  Height, weight, and BMI.  Blood pressure.  Lipid and cholesterol levels. These may be checked every 5 years, or more frequently if you are over 68 years old.  Skin check.  Lung cancer screening. You may have this screening every year starting at age 955 if you have a 30-pack-year history of smoking and currently smoke or have quit within the past 15 years.  Fecal occult blood test (FOBT) of the stool. You may have this test every year starting at age 68.  Flexible sigmoidoscopy or colonoscopy. You may have a sigmoidoscopy every 5 years or a colonoscopy every 10 years starting at age 68.  Prostate cancer screening. Recommendations will vary depending on your family history and other risks.  Hepatitis C blood test.  Hepatitis B blood test.  Sexually transmitted disease (STD) testing.  Diabetes screening. This is done by checking your blood sugar (glucose) after you have not eaten for a while (fasting). You may have this done every 1-3 years.  Abdominal aortic aneurysm (AAA) screening. You may need this if you are a current or former smoker.  Osteoporosis. You may be screened starting at age 68 if you are at high risk. Talk with your health care provider about your test results, treatment options, and if necessary, the need for more  tests. Vaccines  Your health care provider may recommend certain vaccines, such as:  Influenza vaccine. This is recommended every year.  Tetanus, diphtheria, and acellular pertussis (Tdap, Td) vaccine. You may need a Td booster every 10 years.  Zoster vaccine. You may  need this after age 74.  Pneumococcal 13-valent conjugate (PCV13) vaccine. One dose is recommended after age 50.  Pneumococcal polysaccharide (PPSV23) vaccine. One dose is recommended after age 68. Talk to your health care provider about which screenings and vaccines you need and how often you need them. This information is not intended to replace advice given to you by your health care provider. Make sure you discuss any questions you have with your health care provider. Document Released: 09/05/2015 Document Revised: 04/28/2016 Document Reviewed: 06/10/2015 Elsevier Interactive Patient Education  2017 [place] Prevention in the Home Falls can cause injuries. They can happen to people of all ages. There are many things you can do to make your home safe and to help prevent falls. What can I do on the outside of my home?  Regularly fix the edges of walkways and driveways and fix any cracks.  Remove anything that might make you trip as you walk through a door, such as a raised step or threshold.  Trim any bushes or trees on the path to your home.  Use bright outdoor lighting.  Clear any walking paths of anything that might make someone trip, such as rocks or tools.  Regularly check to see if handrails are loose or broken. Make sure that both sides of any steps have handrails.  Any raised decks and porches should have guardrails on the edges.  Have any leaves, snow, or ice cleared regularly.  Use sand or salt on walking paths during winter.  Clean up any spills in your garage right away. This includes oil or grease spills. What can I do in the bathroom?  Use night lights.  Install grab bars by the toilet and in the tub and shower. Do not use towel bars as grab bars.  Use non-skid mats or decals in the tub or shower.  If you need to sit down in the shower, use a plastic, non-slip stool.  Keep the floor dry. Clean up any water that spills on the floor as soon as it  happens.  Remove soap buildup in the tub or shower regularly.  Attach bath mats securely with double-sided non-slip rug tape.  Do not have throw rugs and other things on the floor that can make you trip. What can I do in the bedroom?  Use night lights.  Make sure that you have a light by your bed that is easy to reach.  Do not use any sheets or blankets that are too big for your bed. They should not hang down onto the floor.  Have a firm chair that has side arms. You can use this for support while you get dressed.  Do not have throw rugs and other things on the floor that can make you trip. What can I do in the kitchen?  Clean up any spills right away.  Avoid walking on wet floors.  Keep items that you use a lot in easy-to-reach places.  If you need to reach something above you, use a strong step stool that has a grab bar.  Keep electrical cords out of the way.  Do not use floor polish or wax that makes floors slippery. If you must use wax, use non-skid floor  wax.  Do not have throw rugs and other things on the floor that can make you trip. What can I do with my stairs?  Do not leave any items on the stairs.  Make sure that there are handrails on both sides of the stairs and use them. Fix handrails that are broken or loose. Make sure that handrails are as long as the stairways.  Check any carpeting to make sure that it is firmly attached to the stairs. Fix any carpet that is loose or worn.  Avoid having throw rugs at the top or bottom of the stairs. If you do have throw rugs, attach them to the floor with carpet tape.  Make sure that you have a light switch at the top of the stairs and the bottom of the stairs. If you do not have them, ask someone to add them for you. What else can I do to help prevent falls?  Wear shoes that:  Do not have high heels.  Have rubber bottoms.  Are comfortable and fit you well.  Are closed at the toe. Do not wear sandals.  If you  use a stepladder:  Make sure that it is fully opened. Do not climb a closed stepladder.  Make sure that both sides of the stepladder are locked into place.  Ask someone to hold it for you, if possible.  Clearly mark and make sure that you can see:  Any grab bars or handrails.  First and last steps.  Where the edge of each step is.  Use tools that help you move around (mobility aids) if they are needed. These include:  Canes.  Walkers.  Scooters.  Crutches.  Turn on the lights when you go into a dark area. Replace any light bulbs as soon as they burn out.  Set up your furniture so you have a clear path. Avoid moving your furniture around.  If any of your floors are uneven, fix them.  If there are any pets around you, be aware of where they are.  Review your medicines with your doctor. Some medicines can make you feel dizzy. This can increase your chance of falling. Ask your doctor what other things that you can do to help prevent falls. This information is not intended to replace advice given to you by your health care provider. Make sure you discuss any questions you have with your health care provider. Document Released: 06/05/2009 Document Revised: 01/15/2016 Document Reviewed: 09/13/2014 Elsevier Interactive Patient Education  2017 [employer].

## 2018-07-13 ENCOUNTER — Encounter: Payer: Self-pay | Admitting: Family Medicine

## 2018-07-13 ENCOUNTER — Ambulatory Visit: Payer: Medicare Other | Admitting: Family Medicine

## 2018-07-13 VITALS — BP 109/66 | HR 87 | Temp 96.8°F | Ht 75.0 in | Wt 258.4 lb

## 2018-07-13 DIAGNOSIS — M14672 Charcot's joint, left ankle and foot: Secondary | ICD-10-CM

## 2018-07-13 DIAGNOSIS — E1142 Type 2 diabetes mellitus with diabetic polyneuropathy: Secondary | ICD-10-CM | POA: Diagnosis not present

## 2018-07-13 DIAGNOSIS — I10 Essential (primary) hypertension: Secondary | ICD-10-CM | POA: Diagnosis not present

## 2018-07-13 LAB — BAYER DCA HB A1C WAIVED: HB A1C (BAYER DCA - WAIVED): 5.8 % (ref <7.0)

## 2018-07-13 NOTE — Assessment & Plan Note (Signed)
The current medical regimen is effective;  continue present plan and medications.  

## 2018-07-13 NOTE — Assessment & Plan Note (Signed)
Status post surgery with slow healing

## 2018-07-13 NOTE — Progress Notes (Signed)
BP 109/66 (BP Location: Left Arm, Patient Position: Sitting, Cuff Size: Large)   Pulse 87   Temp (!) 96.8 F (36 C)   Ht 6\' 3"  (1.905 m)   Wt 258 lb 6 oz (117.2 kg)   SpO2 98%   BMI 32.29 kg/m    Subjective:    Patient ID: Daniel Leon, male    DOB: 02-28-1951, 67 y.o.   MRN: [id number]  HPI: Daniel Leon is a 67 y.o. male  Med check Patient follow-up doing well taking lisinopril 10 mg without problems good blood pressure control. Diabetes diet controlled without any issues or problems. Takes gabapentin and Celebrex for foot pain and status post foot surgery still wearing a walking boot and has a bone stimulator to help his healing. May be making some progress.  Relevant past medical, surgical, family and social history reviewed and updated as indicated. Interim medical history since our last visit reviewed. Allergies and medications reviewed and updated.  Review of Systems  Constitutional: Negative.   Respiratory: Negative.   Cardiovascular: Negative.     Per HPI unless specifically indicated above     Objective:    BP 109/66 (BP Location: Left Arm, Patient Position: Sitting, Cuff Size: Large)   Pulse 87   Temp (!) 96.8 F (36 C)   Ht 6\' 3"  (1.905 m)   Wt 258 lb 6 oz (117.2 kg)   SpO2 98%   BMI 32.29 kg/m   Wt Readings from Last 3 Encounters:  07/13/18 258 lb 6 oz (117.2 kg)  04/20/18 254 lb 9.6 oz (115.5 kg)  02/28/18 252 lb (114.3 kg)    Physical Exam  Constitutional: He is oriented to person, place, and time. He appears well-developed and well-nourished.  HENT:  Head: Normocephalic and atraumatic.  Eyes: Conjunctivae and EOM are normal.  Neck: Normal range of motion.  Cardiovascular: Normal rate, regular rhythm and normal heart sounds.  Pulmonary/Chest: Effort normal and breath sounds normal.  Musculoskeletal: Normal range of motion.  Neurological: He is alert and oriented to person, place, and time.  Skin: No erythema.  Psychiatric: He  has a normal mood and affect. His behavior is normal. Judgment and thought content normal.    Results for orders placed or performed in visit on 01/26/18  Microscopic Examination  Result Value Ref Range   WBC, UA 0-5 0 - 5 /hpf   RBC, UA 0-2 0 - 2 /hpf   Epithelial Cells (non renal) 0-10 0 - 10 /hpf   Bacteria, UA None seen None seen/Few  Comprehensive metabolic panel  Result Value Ref Range   Glucose 83 65 - 99 mg/dL   BUN 24 8 - 27 mg/dL   Creatinine, Ser 4.091.28 (H) 0.76 - 1.27 mg/dL   GFR calc non Af Amer 57 (L) >59 mL/min/1.73   GFR calc Af Amer 66 >59 mL/min/1.73   BUN/Creatinine Ratio 19 10 - 24   Sodium 140 134 - 144 mmol/L   Potassium 4.7 3.5 - 5.2 mmol/L   Chloride 103 96 - 106 mmol/L   CO2 21 20 - 29 mmol/L   Calcium 9.4 8.6 - 10.2 mg/dL   Total Protein 7.5 6.0 - 8.5 g/dL   Albumin 4.0 3.6 - 4.8 g/dL   Globulin, Total 3.5 1.5 - 4.5 g/dL   Albumin/Globulin Ratio 1.1 (L) 1.2 - 2.2   Bilirubin Total 0.3 0.0 - 1.2 mg/dL   Alkaline Phosphatase 95 39 - 117 IU/L   AST 19 0 - 40  IU/L   ALT 18 0 - 44 IU/L  Lipid panel  Result Value Ref Range   Cholesterol, Total 152 100 - 199 mg/dL   Triglycerides 130 0 - 149 mg/dL   HDL 35 (L) >86 mg/dL   VLDL Cholesterol Cal 27 5 - 40 mg/dL   LDL Calculated 90 0 - 99 mg/dL   Chol/HDL Ratio 4.3 0.0 - 5.0 ratio  CBC with Differential/Platelet  Result Value Ref Range   WBC 6.3 3.4 - 10.8 x10E3/uL   RBC 3.96 (L) 4.14 - 5.80 x10E6/uL   Hemoglobin 11.0 (L) 13.0 - 17.7 g/dL   Hematocrit 57.8 (L) 46.9 - 51.0 %   MCV 84 79 - 97 fL   MCH 27.8 26.6 - 33.0 pg   MCHC 32.9 31.5 - 35.7 g/dL   RDW 62.9 52.8 - 41.3 %   Platelets 313 150 - 450 x10E3/uL   Neutrophils 60 Not Estab. %   Lymphs 24 Not Estab. %   Monocytes 12 Not Estab. %   Eos 2 Not Estab. %   Basos 1 Not Estab. %   Neutrophils Absolute 3.8 1.4 - 7.0 x10E3/uL   Lymphocytes Absolute 1.5 0.7 - 3.1 x10E3/uL   Monocytes Absolute 0.8 0.1 - 0.9 x10E3/uL   EOS (ABSOLUTE) 0.2 0.0 - 0.4  x10E3/uL   Basophils Absolute 0.0 0.0 - 0.2 x10E3/uL   Immature Granulocytes 1 Not Estab. %   Immature Grans (Abs) 0.0 0.0 - 0.1 x10E3/uL  TSH  Result Value Ref Range   TSH 0.786 0.450 - 4.500 uIU/mL  Urinalysis, Routine w reflex microscopic  Result Value Ref Range   Specific Gravity, UA 1.020 1.005 - 1.030   pH, UA 5.5 5.0 - 7.5   Color, UA Yellow Yellow   Appearance Ur Clear Clear   Leukocytes, UA Negative Negative   Protein, UA Negative Negative/Trace   Glucose, UA Negative Negative   Ketones, UA Negative Negative   RBC, UA Trace (A) Negative   Bilirubin, UA Negative Negative   Urobilinogen, Ur 0.2 0.2 - 1.0 mg/dL   Nitrite, UA Negative Negative   Microscopic Examination See below:   PSA  Result Value Ref Range   Prostate Specific Ag, Serum 1.2 0.0 - 4.0 ng/mL      Assessment & Plan:   Problem List Items Addressed This Visit      Cardiovascular and Mediastinum   Essential hypertension - Primary    The current medical regimen is effective;  continue present plan and medications.       Relevant Orders   Basic metabolic panel     Endocrine   DM type 2 with diabetic peripheral neuropathy (HCC)    The current medical regimen is effective;  continue present plan and medications.       Relevant Orders   Bayer DCA Hb A1c Waived     Musculoskeletal and Integument   Charcot's joint of foot    Status post surgery with slow healing          Follow up plan: Return in about 6 months (around 01/11/2019) for Physical Exam.

## 2018-07-14 LAB — BASIC METABOLIC PANEL
BUN/Creatinine Ratio: 23 (ref 10–24)
BUN: 30 mg/dL — ABNORMAL HIGH (ref 8–27)
CALCIUM: 9.6 mg/dL (ref 8.6–10.2)
CO2: 23 mmol/L (ref 20–29)
CREATININE: 1.32 mg/dL — AB (ref 0.76–1.27)
Chloride: 101 mmol/L (ref 96–106)
GFR calc Af Amer: 64 mL/min/{1.73_m2} (ref >59)
GFR, EST NON AFRICAN AMERICAN: 55 mL/min/{1.73_m2} — AB (ref >59)
Glucose: 105 mg/dL — ABNORMAL HIGH (ref 65–99)
POTASSIUM: 4.9 mmol/L (ref 3.5–5.2)
Sodium: 138 mmol/L (ref 134–144)

## 2018-07-17 ENCOUNTER — Encounter: Payer: Self-pay | Admitting: Family Medicine

## 2018-09-05 ENCOUNTER — Ambulatory Visit (INDEPENDENT_AMBULATORY_CARE_PROVIDER_SITE_OTHER): Payer: Medicare Other | Admitting: Orthopedic Surgery

## 2018-09-05 ENCOUNTER — Encounter (INDEPENDENT_AMBULATORY_CARE_PROVIDER_SITE_OTHER): Payer: Self-pay | Admitting: Orthopedic Surgery

## 2018-09-05 VITALS — Ht 75.0 in | Wt 258.4 lb

## 2018-09-05 DIAGNOSIS — E11610 Type 2 diabetes mellitus with diabetic neuropathic arthropathy: Secondary | ICD-10-CM

## 2018-09-05 NOTE — Progress Notes (Signed)
Office Visit Note   Patient: Daniel Leon           Date of Birth: 06-04-1951           MRN: [id number] Visit Date: 09/05/2018              Requested by: Steele Sizer, MD [address] [place], [place] [address] PCP: Steele Sizer, MD  Chief Complaint  Patient presents with  . Left Ankle - Pain, Follow-up      HPI: Patient is a 68 year old gentleman with well-controlled type 2 diabetes.  Patient is status post tibial calcaneal fusion for Charcot collapse of the hindfoot.  Despite excellent stable internal fixation patient's Charcot collapse has created a progressive varus deformity of the hindfoot.  Patient is currently wearing a CROW walker and states that he has difficulty with activities of daily living.  He used states he has pain with progression of the deformity is currently taking gabapentin.  Patient denies a history of smoking or drinking.  He states his diabetes is well controlled with a hemoglobin A1c of 5.7.  Patient presents with x-rays CT scan and MRI scanner.  Assessment & Plan: Visit Diagnoses:  1. Diabetic Charct's arthropathy (HCC)     Plan: Discussed with the patient options for revision tibial calcaneal fusion with removal of the hardware placement of new hardware through an anterior approach risks and benefits including risk of recurrent deformity versus a transtibial amputation.  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  Risks and benefits of both surgical options were discussed patient states he understands and would like to proceed with the transtibial amputation.  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  Follow-Up Instructions: Return in  about 2 weeks (around 09/19/2018).   Ortho Exam  Patient is alert, oriented, no adenopathy, well-dressed, normal affect, normal respiratory effort. Examination patient has a significant varus collapse through the hindfoot status post internal fixation for the Charcot collapse.  He does have venous stasis swelling but there is no ulcers no cellulitis no open wounds no drainage no signs of infection.  Patient has an excellent dorsalis pedis pulse.  He does have an antalgic gait with the CROW walker.  Imaging: No results found. No images are attached to the encounter.  Labs: Lab Results  Component Value Date   HGBA1C 5.8 07/13/2018   HGBA1C 5.9 07/28/2017   HGBA1C 5.5 12/29/2016     Lab Results  Component Value Date   ALBUMIN 4.0 01/26/2018   ALBUMIN 4.4 12/29/2016   ALBUMIN 4.2 12/24/2015    Body mass index is 32.3 kg/m.  Orders:  No orders of the defined types were placed in this encounter.  No orders of the defined types were placed in this encounter.    Procedures: No procedures performed  Clinical Data: No additional findings.  ROS:  All other systems negative, except as noted in the HPI. Review of Systems  Objective: Vital Signs: Ht 6\' 3"  (1.905 m)   Wt 258 lb 6.1 oz (117.2 kg)   BMI 32.30 kg/m   Specialty Comments:  No specialty comments available.  PMFS History: Patient  Active Problem List   Diagnosis Date Noted  . Charcot's joint of foot 01/26/2018  . PE (physical exam), annual 12/29/2016  . Neuropathy 12/29/2016  . BPH (benign prostatic hyperplasia) 12/29/2016  . Advanced care planning/counseling discussion 12/29/2016  . DM type 2 with diabetic peripheral neuropathy (HCC) 06/30/2016  . Anemia 07/28/2015  . Essential hypertension    Past Medical History:  Diagnosis Date  . 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 Charcot's joint of foot   . Diabetes mellitus without complication (HCC)    DIET-CONTROLLED  . Essential  hypertension   . Right bundle branch block (RBBB)   . Traumatic arthropathy of ankle and foot     Family History  Problem Relation Age of Onset  . Heart disease Mother   . Hypertension Mother   . Heart disease Father   . Hypertension Father   . Hypertension Brother   . Prostate cancer Neg Hx   . Bladder Cancer Neg Hx   . Kidney cancer Neg Hx     Past Surgical History:  Procedure Laterality Date  . COLONOSCOPY    . ELBOW SURGERY Right 1966  . FOOT ARTHRODESIS, SUBTALAR Right 2019  . TOTAL ANKLE REPLACEMENT    . ULNAR NERVE TRANSPOSITION Right 09/05/2015   Procedure: ULNAR NERVE DECOMPRESSION/TRANSPOSITION;  Surgeon: [name], MD;  Location: [facility] ORS;  Service: Orthopedics;  Laterality: Right;   Social History   Occupational History  . Not on file  Tobacco Use  . Smoking status: Former Smoker    Packs/day: 0.50    Years: 10.00    Pack years: 5.00    Types: Cigarettes    Last attempt to quit: 07/27/2000    Years since quitting: 18.1  . Smokeless tobacco: Never Used  Substance and Sexual Activity  . Alcohol use: Not Currently    Comment: very little  . Drug use: No  . Sexual activity: Not on file

## 2018-09-07 ENCOUNTER — Ambulatory Visit: Payer: Medicare Other | Admitting: Family Medicine

## 2018-09-22 ENCOUNTER — Telehealth (INDEPENDENT_AMBULATORY_CARE_PROVIDER_SITE_OTHER): Payer: Self-pay | Admitting: Orthopedic Surgery

## 2018-09-22 NOTE — Telephone Encounter (Signed)
Patient called stated he has discussed amputation w/Duda at last appt.  Patient wants to speak with Dr Lajoyce Corners in regard to this surgery.  Please call patient back @ [phone].

## 2018-09-25 NOTE — Telephone Encounter (Signed)
I called pt and he would like to set up surgery for Friday 10/13/2018. Can you please fill out a blue sheet for his BKA he states that he trusts you and wants to proceed with this.

## 2018-09-25 NOTE — Telephone Encounter (Signed)
Schedule for BKA

## 2018-10-03 ENCOUNTER — Ambulatory Visit (INDEPENDENT_AMBULATORY_CARE_PROVIDER_SITE_OTHER): Payer: Self-pay | Admitting: Physician Assistant

## 2018-10-11 ENCOUNTER — Encounter (HOSPITAL_COMMUNITY): Payer: Self-pay | Admitting: *Deleted

## 2018-10-11 ENCOUNTER — Other Ambulatory Visit: Payer: Self-pay

## 2018-10-11 NOTE — Progress Notes (Signed)
Pt denies SOB, chest pain, and being under the care of a cardiologist. Pt denies having a stress test, echo and cardiac cath. Pt denies having an EKG and chest x ray within the last year. Pt denies recent labs. Pt made aware to stop taking vitamins, fish oil and herbal medications. Do not take any NSAIDs ie: Ibuprofen, Advil, Naproxen (Aleve), Motrin, Voltaren, Celebrex, BC and Goody Powder or any medication containing Aspirin. Pt stated that he does not check his blood glucose (DM diet controlled). Pt verbalized understanding of all pre-op instructions.

## 2018-10-12 MED ORDER — CEFAZOLIN SODIUM 10 G IJ SOLR
3.0000 g | INTRAMUSCULAR | Status: AC
  Administered 2018-10-13: 3 g via INTRAVENOUS
  Filled 2018-10-12: qty 3

## 2018-10-13 ENCOUNTER — Inpatient Hospital Stay (HOSPITAL_COMMUNITY): Payer: Medicare Other | Admitting: Anesthesiology

## 2018-10-13 ENCOUNTER — Other Ambulatory Visit: Payer: Self-pay

## 2018-10-13 ENCOUNTER — Encounter (HOSPITAL_COMMUNITY)
Admission: RE | Disposition: A | Discharge status: Home Health | Payer: Self-pay | Source: Home / Self Care | Attending: Orthopedic Surgery

## 2018-10-13 ENCOUNTER — Encounter (HOSPITAL_COMMUNITY): Payer: Self-pay | Admitting: *Deleted

## 2018-10-13 ENCOUNTER — Inpatient Hospital Stay (HOSPITAL_COMMUNITY)
Admission: RE | Admit: 2018-10-13 | Discharge: 2018-10-17 | DRG: 041 | Disposition: A | Discharge status: Home Health | Payer: Medicare Other | Attending: Orthopedic Surgery | Admitting: Orthopedic Surgery

## 2018-10-13 DIAGNOSIS — E11610 Type 2 diabetes mellitus with diabetic neuropathic arthropathy: Principal | ICD-10-CM

## 2018-10-13 DIAGNOSIS — Z89512 Acquired absence of left leg below knee: Secondary | ICD-10-CM

## 2018-10-13 DIAGNOSIS — Z8249 Family history of ischemic heart disease and other diseases of the circulatory system: Secondary | ICD-10-CM

## 2018-10-13 DIAGNOSIS — M21172 Varus deformity, not elsewhere classified, left ankle: Secondary | ICD-10-CM | POA: Diagnosis present

## 2018-10-13 DIAGNOSIS — Z7982 Long term (current) use of aspirin: Secondary | ICD-10-CM

## 2018-10-13 DIAGNOSIS — I10 Essential (primary) hypertension: Secondary | ICD-10-CM | POA: Diagnosis present

## 2018-10-13 DIAGNOSIS — Z9689 Presence of other specified functional implants: Secondary | ICD-10-CM | POA: Diagnosis present

## 2018-10-13 DIAGNOSIS — Z96651 Presence of right artificial knee joint: Secondary | ICD-10-CM | POA: Diagnosis present

## 2018-10-13 DIAGNOSIS — D62 Acute posthemorrhagic anemia: Secondary | ICD-10-CM | POA: Diagnosis not present

## 2018-10-13 DIAGNOSIS — Z87891 Personal history of nicotine dependence: Secondary | ICD-10-CM

## 2018-10-13 DIAGNOSIS — Z79899 Other long term (current) drug therapy: Secondary | ICD-10-CM | POA: Diagnosis not present

## 2018-10-13 HISTORY — DX: Presence of spectacles and contact lenses: Z97.3

## 2018-10-13 HISTORY — PX: AMPUTATION: SHX166

## 2018-10-13 HISTORY — DX: Unspecified cataract: H26.9

## 2018-10-13 HISTORY — DX: Charcot's joint, left ankle and foot: M14.672

## 2018-10-13 LAB — BASIC METABOLIC PANEL
Anion gap: 10 (ref 5–15)
BUN: 26 mg/dL — ABNORMAL HIGH (ref 8–23)
CHLORIDE: 105 mmol/L (ref 98–111)
CO2: 22 mmol/L (ref 22–32)
Calcium: 8.8 mg/dL — ABNORMAL LOW (ref 8.9–10.3)
Creatinine, Ser: 1.39 mg/dL — ABNORMAL HIGH (ref 0.61–1.24)
GFR calc Af Amer: 60 mL/min — ABNORMAL LOW (ref >60)
GFR calc non Af Amer: 51 mL/min — ABNORMAL LOW (ref >60)
Glucose, Bld: 122 mg/dL — ABNORMAL HIGH (ref 70–99)
Potassium: 4.8 mmol/L (ref 3.5–5.1)
SODIUM: 137 mmol/L (ref 135–145)

## 2018-10-13 LAB — GLUCOSE, CAPILLARY
Glucose-Capillary: 108 mg/dL — ABNORMAL HIGH (ref 70–99)
Glucose-Capillary: 102 mg/dL — ABNORMAL HIGH (ref 70–99)
Glucose-Capillary: 113 mg/dL — ABNORMAL HIGH (ref 70–99)
Glucose-Capillary: 119 mg/dL — ABNORMAL HIGH (ref 70–99)
Glucose-Capillary: 179 mg/dL — ABNORMAL HIGH (ref 70–99)
Glucose-Capillary: 140 mg/dL — ABNORMAL HIGH (ref 70–99)

## 2018-10-13 LAB — CBC
HCT: 35.1 % — ABNORMAL LOW (ref 39.0–52.0)
HEMOGLOBIN: 11.3 g/dL — AB (ref 13.0–17.0)
MCH: 28.5 pg (ref 26.0–34.0)
MCHC: 32.2 g/dL (ref 30.0–36.0)
MCV: 88.4 fL (ref 80.0–100.0)
Platelets: 256 10*3/uL (ref 150–400)
RBC: 3.97 MIL/uL — ABNORMAL LOW (ref 4.22–5.81)
RDW: 15.2 % (ref 11.5–15.5)
WBC: 5.7 10*3/uL (ref 4.0–10.5)
nRBC: 0 % (ref 0.0–0.2)

## 2018-10-13 LAB — HEMOGLOBIN A1C
Hgb A1c MFr Bld: 5.7 % — ABNORMAL HIGH (ref 4.8–5.6)
Mean Plasma Glucose: 116.89 mg/dL

## 2018-10-13 SURGERY — AMPUTATION BELOW KNEE
Anesthesia: General | Laterality: Left

## 2018-10-13 MED ORDER — ACETAMINOPHEN 10 MG/ML IV SOLN
1000.0000 mg | Freq: Once | INTRAVENOUS | Status: AC
  Administered 2018-10-13: 1000 mg via INTRAVENOUS

## 2018-10-13 MED ORDER — EPHEDRINE SULFATE 50 MG/ML IJ SOLN
INTRAMUSCULAR | Status: DC | PRN
  Administered 2018-10-13: 10 mg via INTRAVENOUS

## 2018-10-13 MED ORDER — ACETAMINOPHEN 500 MG PO TABS
1000.0000 mg | ORAL_TABLET | Freq: Four times a day (QID) | ORAL | Status: AC
  Administered 2018-10-13 – 2018-10-14 (×3): 1000 mg via ORAL
  Filled 2018-10-13 (×4): qty 2

## 2018-10-13 MED ORDER — METHOCARBAMOL 500 MG PO TABS
500.0000 mg | ORAL_TABLET | Freq: Four times a day (QID) | ORAL | Status: DC | PRN
  Administered 2018-10-14 – 2018-10-17 (×4): 500 mg via ORAL
  Filled 2018-10-13 (×4): qty 1

## 2018-10-13 MED ORDER — FENTANYL CITRATE (PF) 250 MCG/5ML IJ SOLN
INTRAMUSCULAR | Status: AC
  Filled 2018-10-13: qty 5

## 2018-10-13 MED ORDER — CHLORHEXIDINE GLUCONATE 4 % EX LIQD: 60.0000 mL | Freq: Once | CUTANEOUS | Status: DC

## 2018-10-13 MED ORDER — DOCUSATE SODIUM 100 MG PO CAPS
100.0000 mg | ORAL_CAPSULE | Freq: Two times a day (BID) | ORAL | Status: DC
  Administered 2018-10-13 – 2018-10-17 (×6): 100 mg via ORAL
  Filled 2018-10-13 (×7): qty 1

## 2018-10-13 MED ORDER — FENTANYL CITRATE (PF) 100 MCG/2ML IJ SOLN
INTRAMUSCULAR | Status: AC
  Filled 2018-10-13: qty 2

## 2018-10-13 MED ORDER — MIDAZOLAM HCL 2 MG/2ML IJ SOLN
INTRAMUSCULAR | Status: AC
  Filled 2018-10-13: qty 2

## 2018-10-13 MED ORDER — INSULIN ASPART 100 UNIT/ML ~~LOC~~ SOLN
0.0000 [IU] | Freq: Three times a day (TID) | SUBCUTANEOUS | Status: DC
  Administered 2018-10-13: 2 [IU] via SUBCUTANEOUS
  Administered 2018-10-14: 1 [IU] via SUBCUTANEOUS
  Administered 2018-10-15: 2 [IU] via SUBCUTANEOUS
  Administered 2018-10-16 – 2018-10-17 (×3): 1 [IU] via SUBCUTANEOUS

## 2018-10-13 MED ORDER — FENTANYL CITRATE (PF) 100 MCG/2ML IJ SOLN
25.0000 ug | INTRAMUSCULAR | Status: DC | PRN
  Administered 2018-10-13 (×2): 50 ug via INTRAVENOUS

## 2018-10-13 MED ORDER — PROPOFOL 10 MG/ML IV BOLUS
INTRAVENOUS | Status: AC
  Filled 2018-10-13: qty 20

## 2018-10-13 MED ORDER — FENTANYL CITRATE (PF) 250 MCG/5ML IJ SOLN
INTRAMUSCULAR | Status: DC | PRN
  Administered 2018-10-13: 50 ug via INTRAVENOUS
  Administered 2018-10-13: 25 ug via INTRAVENOUS
  Administered 2018-10-13: 50 ug via INTRAVENOUS
  Administered 2018-10-13: 25 ug via INTRAVENOUS

## 2018-10-13 MED ORDER — INSULIN ASPART 100 UNIT/ML ~~LOC~~ SOLN
3.0000 [IU] | Freq: Three times a day (TID) | SUBCUTANEOUS | Status: DC
  Administered 2018-10-13 – 2018-10-17 (×11): 3 [IU] via SUBCUTANEOUS

## 2018-10-13 MED ORDER — PROPOFOL 10 MG/ML IV BOLUS
INTRAVENOUS | Status: DC | PRN
  Administered 2018-10-13: 180 mg via INTRAVENOUS

## 2018-10-13 MED ORDER — LACTATED RINGERS IV SOLN
INTRAVENOUS | Status: DC
  Administered 2018-10-13: 07:00:00 via INTRAVENOUS

## 2018-10-13 MED ORDER — ACETAMINOPHEN 10 MG/ML IV SOLN
INTRAVENOUS | Status: AC
  Filled 2018-10-13: qty 100

## 2018-10-13 MED ORDER — OXYCODONE HCL 5 MG PO TABS
5.0000 mg | ORAL_TABLET | ORAL | Status: DC | PRN
  Administered 2018-10-13: 5 mg via ORAL
  Administered 2018-10-14 (×2): 10 mg via ORAL
  Administered 2018-10-14: 5 mg via ORAL
  Administered 2018-10-14: 10 mg via ORAL
  Filled 2018-10-13: qty 1
  Filled 2018-10-13: qty 2
  Filled 2018-10-13 (×2): qty 1
  Filled 2018-10-13 (×4): qty 2

## 2018-10-13 MED ORDER — ACETAMINOPHEN 325 MG PO TABS: 325.0000 mg | ORAL_TABLET | Freq: Four times a day (QID) | ORAL | Status: DC | PRN

## 2018-10-13 MED ORDER — PHENYLEPHRINE HCL 10 MG/ML IJ SOLN
INTRAMUSCULAR | Status: DC | PRN
  Administered 2018-10-13: 80 ug via INTRAVENOUS

## 2018-10-13 MED ORDER — ONDANSETRON HCL 4 MG PO TABS: 4.0000 mg | ORAL_TABLET | Freq: Four times a day (QID) | ORAL | Status: DC | PRN

## 2018-10-13 MED ORDER — OXYCODONE HCL 5 MG PO TABS
ORAL_TABLET | ORAL | Status: AC
  Filled 2018-10-13: qty 1

## 2018-10-13 MED ORDER — LIDOCAINE 2% (20 MG/ML) 5 ML SYRINGE
INTRAMUSCULAR | Status: DC | PRN
  Administered 2018-10-13: 60 mg via INTRAVENOUS

## 2018-10-13 MED ORDER — METHOCARBAMOL 1000 MG/10ML IJ SOLN
500.0000 mg | Freq: Four times a day (QID) | INTRAVENOUS | Status: DC | PRN
  Filled 2018-10-13: qty 5

## 2018-10-13 MED ORDER — DEXAMETHASONE SODIUM PHOSPHATE 10 MG/ML IJ SOLN
INTRAMUSCULAR | Status: DC | PRN
  Administered 2018-10-13: 10 mg via INTRAVENOUS

## 2018-10-13 MED ORDER — MIDAZOLAM HCL 2 MG/2ML IJ SOLN
INTRAMUSCULAR | Status: DC | PRN
  Administered 2018-10-13: 2 mg via INTRAVENOUS

## 2018-10-13 MED ORDER — METOCLOPRAMIDE HCL 5 MG PO TABS: 5.0000 mg | ORAL_TABLET | Freq: Three times a day (TID) | ORAL | Status: DC | PRN

## 2018-10-13 MED ORDER — 0.9 % SODIUM CHLORIDE (POUR BTL) OPTIME
TOPICAL | Status: DC | PRN
  Administered 2018-10-13: 1000 mL

## 2018-10-13 MED ORDER — ONDANSETRON HCL 4 MG/2ML IJ SOLN
INTRAMUSCULAR | Status: DC | PRN
  Administered 2018-10-13: 4 mg via INTRAVENOUS

## 2018-10-13 MED ORDER — OXYCODONE HCL 5 MG/5ML PO SOLN: 5.0000 mg | Freq: Once | ORAL | Status: AC | PRN

## 2018-10-13 MED ORDER — POTASSIUM CHLORIDE IN NACL 20-0.45 MEQ/L-% IV SOLN
INTRAVENOUS | Status: DC
  Administered 2018-10-13: 14:00:00 via INTRAVENOUS
  Filled 2018-10-13 (×2): qty 1000

## 2018-10-13 MED ORDER — OXYCODONE HCL 5 MG PO TABS
5.0000 mg | ORAL_TABLET | Freq: Once | ORAL | Status: AC | PRN
  Administered 2018-10-13: 5 mg via ORAL

## 2018-10-13 MED ORDER — HYDROMORPHONE HCL 1 MG/ML IJ SOLN
0.5000 mg | INTRAMUSCULAR | Status: DC | PRN
  Administered 2018-10-13 – 2018-10-14 (×3): 1 mg via INTRAVENOUS
  Filled 2018-10-13 (×3): qty 1

## 2018-10-13 MED ORDER — OXYCODONE HCL 5 MG PO TABS
10.0000 mg | ORAL_TABLET | ORAL | Status: DC | PRN
  Administered 2018-10-14: 15 mg via ORAL
  Filled 2018-10-13: qty 3

## 2018-10-13 MED ORDER — METOCLOPRAMIDE HCL 5 MG/ML IJ SOLN: 5.0000 mg | Freq: Three times a day (TID) | INTRAMUSCULAR | Status: DC | PRN

## 2018-10-13 MED ORDER — ONDANSETRON HCL 4 MG/2ML IJ SOLN: 4.0000 mg | Freq: Four times a day (QID) | INTRAMUSCULAR | Status: DC | PRN

## 2018-10-13 MED ORDER — ONDANSETRON HCL 4 MG/2ML IJ SOLN: 4.0000 mg | Freq: Once | INTRAMUSCULAR | Status: DC | PRN

## 2018-10-13 SURGICAL SUPPLY — 44 items
BENZOIN TINCTURE PRP APPL 2/3 (GAUZE/BANDAGES/DRESSINGS) ×12 IMPLANT
BLADE SAW RECIP 87.9 MT (BLADE) ×3 IMPLANT
BLADE SURG 21 STRL SS (BLADE) ×3 IMPLANT
BNDG COHESIVE 6X5 TAN STRL LF (GAUZE/BANDAGES/DRESSINGS) ×3 IMPLANT
BNDG GAUZE ELAST 4 BULKY (GAUZE/BANDAGES/DRESSINGS) ×3 IMPLANT
CANISTER WOUND CARE 500ML ATS (WOUND CARE) ×3 IMPLANT
COVER SURGICAL LIGHT HANDLE (MISCELLANEOUS) ×3 IMPLANT
COVER WAND RF STERILE (DRAPES) ×3 IMPLANT
CUFF TOURNIQUET SINGLE 34IN LL (TOURNIQUET CUFF) ×3 IMPLANT
CUFF TOURNIQUET SINGLE 44IN (TOURNIQUET CUFF) IMPLANT
DRAPE INCISE IOBAN 66X45 STRL (DRAPES) ×3 IMPLANT
DRAPE U-SHAPE 47X51 STRL (DRAPES) ×3 IMPLANT
DRESSING PREVENA PLUS CUSTOM (GAUZE/BANDAGES/DRESSINGS) ×1 IMPLANT
DRSG ADAPTIC 3X8 NADH LF (GAUZE/BANDAGES/DRESSINGS) ×3 IMPLANT
DRSG PREVENA PLUS CUSTOM (GAUZE/BANDAGES/DRESSINGS) ×3
DURAPREP 26ML APPLICATOR (WOUND CARE) ×3 IMPLANT
ELECT REM PT RETURN 9FT ADLT (ELECTROSURGICAL) ×3
ELECTRODE REM PT RTRN 9FT ADLT (ELECTROSURGICAL) ×1 IMPLANT
GAUZE SPONGE 4X4 12PLY STRL LF (GAUZE/BANDAGES/DRESSINGS) ×3 IMPLANT
GLOVE BIOGEL PI IND STRL 9 (GLOVE) ×1 IMPLANT
GLOVE BIOGEL PI INDICATOR 9 (GLOVE) ×2
GLOVE SURG ORTHO 9.0 STRL STRW (GLOVE) ×3 IMPLANT
GOWN STRL REUS W/ TWL XL LVL3 (GOWN DISPOSABLE) ×2 IMPLANT
GOWN STRL REUS W/TWL XL LVL3 (GOWN DISPOSABLE) ×4
KIT BASIN OR (CUSTOM PROCEDURE TRAY) ×3 IMPLANT
KIT TURNOVER KIT B (KITS) ×3 IMPLANT
MANIFOLD NEPTUNE II (INSTRUMENTS) ×3 IMPLANT
NS IRRIG 1000ML POUR BTL (IV SOLUTION) ×3 IMPLANT
PACK ORTHO EXTREMITY (CUSTOM PROCEDURE TRAY) ×3 IMPLANT
PAD ABD 8X10 STRL (GAUZE/BANDAGES/DRESSINGS) ×3 IMPLANT
PAD ARMBOARD 7.5X6 YLW CONV (MISCELLANEOUS) ×3 IMPLANT
PREVENA INCISION MGT 90 150 (MISCELLANEOUS) ×3 IMPLANT
PREVENA RESTOR ARTHOFORM 33X30 (CANNISTER) ×3 IMPLANT
PREVENA RESTOR ARTHOFORM 46X30 (CANNISTER) ×3 IMPLANT
SPONGE LAP 18X18 RF (DISPOSABLE) ×3 IMPLANT
STAPLER VISISTAT 35W (STAPLE) ×3 IMPLANT
STOCKINETTE IMPERVIOUS LG (DRAPES) ×3 IMPLANT
SUT SILK 2 0 (SUTURE) ×2
SUT SILK 2-0 18XBRD TIE 12 (SUTURE) ×1 IMPLANT
SUT VIC AB 1 CTX 27 (SUTURE) IMPLANT
TOWEL OR 17X26 10 PK STRL BLUE (TOWEL DISPOSABLE) ×3 IMPLANT
TUBE CONNECTING 12'X1/4 (SUCTIONS) ×1
TUBE CONNECTING 12X1/4 (SUCTIONS) ×2 IMPLANT
YANKAUER SUCT BULB TIP NO VENT (SUCTIONS) ×3 IMPLANT

## 2018-10-13 NOTE — Anesthesia Preprocedure Evaluation (Signed)
Anesthesia Evaluation  Patient identified by MRN, date of birth, ID band Patient awake    Reviewed: Allergy & Precautions, NPO status , Patient's Chart, lab work & pertinent test results  Airway Mallampati: II  TM Distance: >3 FB Neck ROM: Full    Dental  (+) Teeth Intact, Dental Advisory Given   Pulmonary former smoker,    breath sounds clear to auscultation       Cardiovascular hypertension,  Rhythm:Regular Rate:Normal     Neuro/Psych    GI/Hepatic   Endo/Other  diabetes  Renal/GU      Musculoskeletal   Abdominal   Peds  Hematology   Anesthesia Other Findings   Reproductive/Obstetrics                             Anesthesia Physical Anesthesia Plan  ASA: III  Anesthesia Plan: General   Post-op Pain Management:    Induction: Intravenous  PONV Risk Score and Plan: Ondansetron and Dexamethasone  Airway Management Planned: LMA  Additional Equipment:   Intra-op Plan:   Post-operative Plan:   Informed Consent: I have reviewed the patients History and Physical, chart, labs and discussed the procedure including the risks, benefits and alternatives for the proposed anesthesia with the patient or authorized representative who has indicated his/her understanding and acceptance.     Dental advisory given  Plan Discussed with: CRNA and Anesthesiologist  Anesthesia Plan Comments:         Anesthesia Quick Evaluation  

## 2018-10-13 NOTE — H&P (Signed)
Daniel Leon is an 68 y.o. male.   Chief Complaint: Left foot severe Charcot arthropathy with severe collapse  HPI: The patient is a 68 year old gentleman with well-controlled type 2 diabetes.  He is status post tibial calcaneal fusion for Charcot collapse of the hindfoot.  Despite excellent stable internal fixation, the patient Charcot collapse has created a progressive varus deformity of the hindfoot.  The patient is currently wearing a CROW walker and states that he has difficulty with activities of daily living.  His pain has progressed due to the progression of the deformity and he is taking gabapentin for this.  His last hemoglobin A1c was 5.7.  Patient was offered revision of his fusion versus left transtibial amputation.  After lengthy discussion concerning the risk and benefits of both procedures and the functionality following surgery the patient elected to proceed with a left transtibial amputation.  Past Medical History:  Diagnosis Date  . 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 Charcot ankle, left    collapse  . Charcot's joint of foot   . Diabetes mellitus without complication (HCC)    DIET-CONTROLLED  . Early cataracts, bilateral   . Essential hypertension   . Right bundle branch block (RBBB)   . Traumatic arthropathy of ankle and foot   . Wears glasses     Past Surgical History:  Procedure Laterality Date  . COLONOSCOPY    . ELBOW SURGERY Right 1966  . FOOT ARTHRODESIS, SUBTALAR Right 2019  . TONSILLECTOMY    . TOTAL ANKLE REPLACEMENT    . ULNAR NERVE TRANSPOSITION Right 09/05/2015   Procedure: ULNAR NERVE DECOMPRESSION/TRANSPOSITION;  Surgeon: [name], MD;  Location: [facility] ORS;  Service: Orthopedics;  Laterality: Right;    Family History  Problem Relation Age of Onset  . Heart disease Mother   . Hypertension Mother   . Heart disease Father   . Hypertension Father   . Hypertension Brother   . Prostate cancer Neg Hx   . Bladder Cancer  Neg Hx   . Kidney cancer Neg Hx    Social History:  reports that he quit smoking about 18 years ago. His smoking use included cigarettes. He has a 5.00 pack-year smoking history. He has never used smokeless tobacco. He reports current alcohol use. He reports that he does not use drugs.  Allergies: No Known Allergies  Medications Prior to Admission  Medication Sig Dispense Refill  . acetaminophen (TYLENOL) 500 MG tablet Take 1,000 mg by mouth every 6 (six) hours as needed for moderate pain.    Marland Kitchen aspirin EC 81 MG tablet Take 81 mg by mouth daily.     . celecoxib (CELEBREX) 200 MG capsule Take 200 mg by mouth at bedtime.   2  . Cholecalciferol (VITAMIN D3) 125 MCG (5000 UT) CAPS Take 5,000 Units by mouth daily.    . diclofenac sodium (VOLTAREN) 1 % GEL Apply 1 application topically 4 (four) times daily as needed (pain).    Marland Kitchen gabapentin (NEURONTIN) 400 MG capsule Take 400 mg by mouth See admin instructions. Take 400 mg twice daily, may take a 3rd 400 mg dose as needed for pain    . lisinopril (PRINIVIL,ZESTRIL) 10 MG tablet Take 1 tablet (10 mg total) by mouth daily. 90 tablet 4  . Multiple Vitamin (MULTIVITAMIN) tablet Take 1 tablet by mouth daily.      Results for orders placed or performed during the hospital encounter of 10/13/18 (from the past 48 hour(s))  Glucose,  capillary     Status: Abnormal   Collection Time: 10/13/18  6:14 AM  Result Value Ref Range   Glucose-Capillary 108 (H) 70 - 99 mg/dL  Hemoglobin T6R     Status: Abnormal   Collection Time: 10/13/18  6:51 AM  Result Value Ref Range   Hgb A1c MFr Bld 5.7 (H) 4.8 - 5.6 %    Comment: (NOTE) Pre diabetes:          5.7%-6.4% Diabetes:              >6.4% Glycemic control for   <7.0% adults with diabetes    Mean Plasma Glucose 116.89 mg/dL    Comment: Performed at [facility], [address], [place], [place] [address]  Basic metabolic panel     Status: Abnormal   Collection Time: 10/13/18  6:51 AM  Result Value Ref  Range   Sodium 137 135 - 145 mmol/L   Potassium 4.8 3.5 - 5.1 mmol/L   Chloride 105 98 - 111 mmol/L   CO2 22 22 - 32 mmol/L   Glucose, Bld 122 (H) 70 - 99 mg/dL   BUN 26 (H) 8 - 23 mg/dL   Creatinine, Ser 4.00 (H) 0.61 - 1.24 mg/dL   Calcium 8.8 (L) 8.9 - 10.3 mg/dL   GFR calc non Af Amer 51 (L) >60 mL/min   GFR calc Af Amer 60 (L) >60 mL/min   Anion gap 10 5 - 15    Comment: Performed at [facility], [address] [place], [place] [address]  CBC     Status: Abnormal   Collection Time: 10/13/18  6:51 AM  Result Value Ref Range   WBC 5.7 4.0 - 10.5 K/uL   RBC 3.97 (L) 4.22 - 5.81 MIL/uL   Hemoglobin 11.3 (L) 13.0 - 17.0 g/dL   HCT 95.0 (L) 93.2 - 67.1 %   MCV 88.4 80.0 - 100.0 fL   MCH 28.5 26.0 - 34.0 pg   MCHC 32.2 30.0 - 36.0 g/dL   RDW 24.5 80.9 - 98.3 %   Platelets 256 150 - 400 K/uL   nRBC 0.0 0.0 - 0.2 %    Comment: Performed at [facility], [address], [place], [place] [address]   No results found.  Review of Systems  All other systems reviewed and are negative.   Blood pressure 128/66, pulse 67, temperature 98.5 F (36.9 C), temperature source Oral, resp. rate 18, height 6\' 4"  (1.93 m), weight 113.4 kg, SpO2 98 %. Physical Exam  Constitutional: He is oriented to person, place, and time. He appears well-developed and well-nourished. No distress.  HENT:  Head: Normocephalic and atraumatic.  Neck: No tracheal deviation present. No thyromegaly present.  Cardiovascular: Normal rate and regular rhythm.  Respiratory: Effort normal. No stridor. No respiratory distress.  GI: Soft. He exhibits no distension.  Musculoskeletal:     Comments: Left foot- varus collapse through the hindfoot status post internal fixation for the Charcot collapse.  He does have venous stasis swelling but there is no ulcers no cellulitis no open wounds no drainage no signs of infection.  Patient has an excellent dorsalis pedis pulse.  He does have an antalgic gait with the CROW  walker.  Neurological: He is alert and oriented to person, place, and time. No cranial nerve deficit.  Skin: Skin is warm and dry. No rash noted. No erythema. No pallor.  Psychiatric: He has a normal mood and affect. His behavior  is normal. Judgment and thought content normal.     Assessment/Plan Severe Charcot arthropathy left foot/ankle- Plan left below the knee amputation.  The procedure and possible risks and benefits including risks of infection, bleeding, neurovascular injury, and possible need for further surgery were discussed at length with the patient.  The patient's questions were answered to his satisfaction and he desires to proceed visits at this time.  Lazaro ArmsSHAWN Kess Mcilwain, PA-C 10/13/2018, 7:34 AM [facility] [phone]

## 2018-10-13 NOTE — Progress Notes (Signed)
Orthopedic Tech Progress Note Patient Details:  Daniel Leon [birth year] [id number]  Patient ID: [name], male   DOB: 10-09-1950, 68 y.o.   MRN: [id number] Called in order to bio tech  Trinna Post 10/13/2018, 12:11 PM

## 2018-10-13 NOTE — Anesthesia Procedure Notes (Signed)
Procedure Name: LMA Insertion Date/Time: 10/13/2018 8:52 AM Performed by: Dairl Ponder, CRNA Pre-anesthesia Checklist: Patient identified, Emergency Drugs available, Suction available, Patient being monitored and Timeout performed Patient Re-evaluated:Patient Re-evaluated prior to induction Oxygen Delivery Method: Circle system utilized Preoxygenation: Pre-oxygenation with 100% oxygen Induction Type: IV induction LMA: LMA inserted LMA Size: 5.0 Number of attempts: 1 Placement Confirmation: positive ETCO2 and breath sounds checked- equal and bilateral Tube secured with: Tape Dental Injury: Teeth and Oropharynx as per pre-operative assessment

## 2018-10-13 NOTE — Op Note (Signed)
   Date of Surgery: 10/13/2018  INDICATIONS: Mr. Orris is a 68 y.o.-year-old male who presents with Charcot collapse of the left foot and ankle.  Patient is status post internal fixation with a tibial calcaneal nail.  Patient has progressive varus collapse despite the internal fixation with the Charcot arthropathy.  Patient has been treated with a Crow walker and despite the immobilization patient still has pain and difficulty with ambulation and wishes to proceed at this time for transtibial amputation.Marland Kitchen  PREOPERATIVE DIAGNOSIS: Diabetic insensate neuropathy with Charcot collapse left foot and ankle status post internal fixation.  POSTOPERATIVE DIAGNOSIS: Same.  PROCEDURE: Transtibial amputation Application of Prevena wound VAC  SURGEON: Lajoyce Corners, M.D.  ANESTHESIA:  general  IV FLUIDS AND URINE: See anesthesia.  ESTIMATED BLOOD LOSS: 200 mL.  COMPLICATIONS: None.  DESCRIPTION OF PROCEDURE: The patient was brought to the operating room and underwent a general anesthetic. After adequate levels of anesthesia were obtained patient's lower extremity was prepped using DuraPrep draped into a sterile field. A timeout was called. The foot was draped out of the sterile field with impervious stockinette. A transverse incision was made 11 cm distal to the tibial tubercle. This curved proximally and a large posterior flap was created. The tibia was transected 1 cm proximal to the skin incision. The fibula was transected just proximal to the tibial incision. The tibia was beveled anteriorly. A large posterior flap was created. The sciatic nerve was pulled cut and allowed to retract. The vascular bundles were suture ligated with 2-0 silk. The deep and superficial fascial layers were closed using #1 Vicryl. The skin was closed using staples and 2-0 nylon. The wound was covered with a Prevena wound VAC. There was a good suction fit. A prosthetic shrinker was applied. Patient was extubated taken to the PACU in  stable condition.   DISCHARGE PLANNING:  Antibiotic duration: 24 hours  Weightbearing: Nonweightbearing on the left  Pain medication: Opioid pathway  Dressing care/ Wound VAC: Continue wound VAC for 1 week  Discharge to: Home possible discharge on Monday.  Follow-up: In the office 1 week post operative.  Aldean Baker, MD [facility] 9:45 AM

## 2018-10-13 NOTE — Progress Notes (Signed)
RN called Daniel Leon and left a message for Daniel Leon, Receptionist, in regards to pt home medications he stated that he takes gabapentin 3 times a day and he needs it for his elbow pain which he is experiencing also that he takes lisinopril daily and did not take it this morning and did not receive it today so he wanted to know if he could start taking that medication again if he is able to. Daniel Leon said she would pass the message along to the on call and they would call me back. Will notify night shift RN.

## 2018-10-13 NOTE — Anesthesia Procedure Notes (Signed)
Anesthesia Regional Block: Popliteal block   Pre-Anesthetic Checklist: ,, timeout performed, Correct Patient, Correct Site, Correct Laterality, Correct Procedure, Correct Position, site marked, Risks and benefits discussed,  Surgical consent,  Pre-op evaluation,  At surgeon's request and post-op pain management  Laterality: Left  Prep: chloraprep       Needles:  Injection technique: Single-shot  Needle Type: Stimulator Needle - 40      Needle Gauge: 22     Additional Needles:   Procedures:, nerve stimulator,,,,,,,  Narrative:  Start time: 10/13/2018 12:10 PM End time: 10/13/2018 12:15 PM Injection made incrementally with aspirations every 5 mL.  Performed by: Personally   Additional Notes: 20 cc 0.75% Ropivacaine injected easily

## 2018-10-13 NOTE — Transfer of Care (Signed)
Immediate Anesthesia Transfer of Care Note  Patient: Daniel Leon  Procedure(s) Performed: LEFT BELOW KNEE AMPUTATION (Left )  Patient Location: PACU  Anesthesia Type:General  Level of Consciousness: awake, alert  and oriented  Airway & Oxygen Therapy: Patient Spontanous Breathing and Patient connected to nasal cannula oxygen  Post-op Assessment: Report given to RN and Post -op Vital signs reviewed and stable  Post vital signs: Reviewed and stable  Last Vitals:  Vitals Value Taken Time  BP 143/69 10/13/2018  9:39 AM  Temp    Pulse 83 10/13/2018  9:39 AM  Resp 14 10/13/2018  9:39 AM  SpO2 99 % 10/13/2018  9:39 AM  Vitals shown include unvalidated device data.  Last Pain:  Vitals:   10/13/18 0648  TempSrc:   PainSc: 0-No pain      Patients Stated Pain Goal: 3 (10/13/18 3704)  Complications: No apparent anesthesia complications

## 2018-10-13 NOTE — Anesthesia Postprocedure Evaluation (Signed)
Anesthesia Post Note  Patient: Daniel Leon  Procedure(s) Performed: LEFT BELOW KNEE AMPUTATION (Left )     Patient location during evaluation: PACU Anesthesia Type: General and Regional Level of consciousness: awake and alert Pain management: pain level controlled Vital Signs Assessment: post-procedure vital signs reviewed and stable Respiratory status: spontaneous breathing, nonlabored ventilation, respiratory function stable and patient connected to nasal cannula oxygen Cardiovascular status: blood pressure returned to baseline and stable Postop Assessment: no apparent nausea or vomiting Anesthetic complications: no    Last Vitals:  Vitals:   10/13/18 1209 10/13/18 1226  BP: 125/65 126/70  Pulse: 72 71  Resp: 18   Temp: 36.4 C (!) 36.4 C  SpO2: 99% 100%    Last Pain:  Vitals:   10/13/18 1248  TempSrc:   PainSc: 0-No pain                 Jacquan Savas COKER

## 2018-10-14 ENCOUNTER — Encounter (HOSPITAL_COMMUNITY): Payer: Self-pay | Admitting: Orthopedic Surgery

## 2018-10-14 LAB — GLUCOSE, CAPILLARY
GLUCOSE-CAPILLARY: 127 mg/dL — AB (ref 70–99)
Glucose-Capillary: 116 mg/dL — ABNORMAL HIGH (ref 70–99)
Glucose-Capillary: 112 mg/dL — ABNORMAL HIGH (ref 70–99)
Glucose-Capillary: 138 mg/dL — ABNORMAL HIGH (ref 70–99)

## 2018-10-14 NOTE — Care Management Note (Addendum)
Case Management Note  Patient Details  Name: ULA YINGER MRN: [id number] Date of Birth: [birth year]  Subjective/Objective:           Pt to return home after BKA.  Pt will be home with wife, who can provide 24 hour assistance.  Pt has walker and is considering need for WC.  Pt requests 3n1 and is agreeable to HHPT.  Pt has Praveena wound vac.         Action/Plan: Medicare rated list viewed by patient and wife.  They choose [facility] for [facility] needs. [facility] is unable to accept due to insurance.    Referral called to [facility] with [facility] for HHPT.    3n1 ordered from [facility] to be delivered to room today.    Expected Discharge Date:                  Expected Discharge Plan:  Home/Self Care  In-House Referral:  NA  Discharge planning Services  CM Consult  Post Acute Care Choice:  Durable Medical Equipment, Home Health Choice offered to:  Patient  DME Arranged:  3-N-1 DME Agency:  [facility]  HH Arranged:   HHPT HH Agency:  [facility] Status of Service:  In process, will continue to follow  If discussed at Long Length of Stay Meetings, dates discussed:    Additional Comments:  Deveron Furlong, RN 10/14/2018, 3:22 PM

## 2018-10-14 NOTE — Progress Notes (Signed)
Subjective: 1 Day Post-Op Procedure(s) (LRB): LEFT BELOW KNEE AMPUTATION (Left) Patient reports pain as mild.  Resting comfortably in bed.  Asking about gabapentin for his elbow.  Usually takes this at home for his elbow, but really does not have much elbow pain currently while on pain regimen. No need to restart at this point  Objective: Vital signs in last 24 hours: Temp:  [97.5 F (36.4 C)-98.3 F (36.8 C)] 98 F (36.7 C) (02/22 0514) Pulse Rate:  [64-99] 82 (02/22 0514) Resp:  [10-18] 16 (02/22 0514) BP: (116-143)/(64-77) 121/69 (02/22 0514) SpO2:  [93 %-100 %] 95 % (02/22 0514)  Intake/Output from previous day: 02/21 0701 - 02/22 0700 In: 1182.8 [P.O.:440; I.V.:642.8; IV Piggyback:100] Out: 2075 [Urine:2025; Blood:50] Intake/Output this shift: Total I/O In: -  Out: 1400 [Urine:1400]  Recent Labs    10/13/18 0651  HGB 11.3*   Recent Labs    10/13/18 0651  WBC 5.7  RBC 3.97*  HCT 35.1*  PLT 256   Recent Labs    10/13/18 0651  NA 137  K 4.8  CL 105  CO2 22  BUN 26*  CREATININE 1.39*  GLUCOSE 122*  CALCIUM 8.8*   No results for input(s): LABPT, INR in the last 72 hours.  Neurologically intact Incision: dressing C/D/I  Wound vac and stump shrinker in place    Assessment/Plan: 1 Day Post-Op Procedure(s) (LRB): LEFT BELOW KNEE AMPUTATION (Left) Advance diet Up with therapy  NWB LLE ABLA- mild and stable Continue wound vac for one week post-op Likely d/c Monday or Tuesday F/u with Dr. Lajoyce Corners one week post-op      Cristie Hem 10/14/2018, 6:54 AM

## 2018-10-14 NOTE — Plan of Care (Signed)

## 2018-10-14 NOTE — Evaluation (Addendum)
Occupational Therapy Evaluation Patient Details Name: Daniel Leon MRN: [id number] DOB: [birth year] Today's Date: 10/14/2018    History of Present Illness The patient is a 68 year old gentleman with well-controlled type 2 diabetes. S/p Left transtibial amputation. PMH: HTN, R elbow surgery.    Clinical Impression   PTA patient reports independent with self care, mobility, driving and working.  Admitted for above and limited by pain, impaired balance and decreased activity tolerance.  He currently requires min guard for basic transfers, min assist for LB ADLs and setup for UB ADLs seated.  Educated on safety, precautions, ADL compensatory techniques, and recommendations. He will have 24/7 support from family at dc.  Patient will benefit from continued OT services while admitted, but anticipate no further needs after DC.      Follow Up Recommendations  No OT follow up    Equipment Recommendations  3 in 1 bedside commode    Recommendations for Other Services       Precautions / Restrictions Precautions Precautions: Other (comment) Precaution Booklet Issued: Yes (comment) Precaution Comments: L BKA  Required Braces or Orthoses: Other Brace Other Brace: L residual limb guard  Restrictions Weight Bearing Restrictions: Yes LLE Weight Bearing: Non weight bearing      Mobility Bed Mobility Overal bed mobility: Needs Assistance Bed Mobility: Sit to Supine     Supine to sit: Min guard Sit to supine: Supervision   General bed mobility comments: supervision for safety   Transfers Overall transfer level: Needs assistance Equipment used: Rolling walker (2 wheeled) Transfers: Sit to/from [employer] Sit to Stand: Min guard Stand pivot transfers: Min guard       General transfer comment: min guard for safeyt and balance, good recall of walker mgmt and safety     Balance Overall balance assessment: Needs assistance Sitting-balance support: No upper  extremity supported;Feet supported Sitting balance-Leahy Scale: Good     Standing balance support: Bilateral upper extremity supported;During functional activity Standing balance-Leahy Scale: Poor Standing balance comment: reliant on B UE support in standing                            ADL either performed or assessed with clinical judgement   ADL Overall ADL's : Needs assistance/impaired     Grooming: Set up;Sitting Grooming Details (indicate cue type and reason): reviewed safety seated at sink      Lower Body Bathing: Minimal assistance;Sit to/from stand Lower Body Bathing Details (indicate cue type and reason): min assist in standing, reviewed safety seated       Lower Body Dressing: Minimal assistance;Sit to/from stand Lower Body Dressing Details (indicate cue type and reason): min assist in standing, reviewed safety seated and using 1 handed technique in standing   Toilet Transfer: Min guard;Ambulation;RW Toilet Transfer Details (indicate cue type and reason): simulated from recliner to EOB Toileting- Clothing Manipulation and Hygiene: Minimal assistance;Sit to/from stand Toileting - Clothing Manipulation Details (indicate cue type and reason): reviewed use of 1 handed technique for safety      Functional mobility during ADLs: Rolling walker;Min guard General ADL Comments: limited by pain and impaired balance      Vision         Perception     Praxis      Pertinent Vitals/Pain Pain Assessment: 0-10 Pain Score: 8  Pain Location: Lt residual limb Pain Descriptors / Indicators: Aching;Burning Pain Intervention(s): Limited activity within patient's tolerance;Repositioned;Patient requesting pain meds-RN notified  Hand Dominance     Extremity/Trunk Assessment Upper Extremity Assessment Upper Extremity Assessment: Overall WFL for tasks assessed   Lower Extremity Assessment Lower Extremity Assessment: LLE deficits/detail RLE Sensation: decreased  light touch LLE Deficits / Details: s/p BKA   Cervical / Trunk Assessment Cervical / Trunk Assessment: Normal   Communication Communication Communication: No difficulties   Cognition Arousal/Alertness: Awake/alert Behavior During Therapy: WFL for tasks assessed/performed Overall Cognitive Status: Within Functional Limits for tasks assessed                                     General Comments  reviewed safety, precautions, positoining, and L residual limb protector use     Exercises Exercises: Amputee Amputee Exercises Quad Sets: AROM;Left;5 reps;Seated Gluteal Sets: Strengthening;Both;5 reps;Seated Towel Squeeze: Strengthening;Both;5 reps;Seated   Shoulder Instructions      Home Living Family/patient expects to be discharged to:: Private residence Living Arrangements: Spouse/significant other Available Help at Discharge: Family;Available 24 hours/day Type of Home: House Home Access: Stairs to enter [employer] of Steps: 2 Entrance Stairs-Rails: None Home Layout: Able to live on main level with bedroom/bathroom     Bathroom Shower/Tub: Chief Strategy Officer: Standard     Home Equipment: Environmental consultant - 2 wheels;Walker - 4 wheels;Crutches;Wheelchair - manual;Shower seat   Additional Comments: Has been using crutches for a while NWB on LT      Prior Functioning/Environment Level of Independence: Independent with assistive device(s)        Comments: Used crutches to ambulate, independent ADLs, driving and working         OT Problem List: Decreased activity tolerance;Impaired balance (sitting and/or standing);Decreased safety awareness;Decreased knowledge of use of DME or AE;Decreased knowledge of precautions;Pain      OT Treatment/Interventions: Therapeutic exercise;DME and/or AE instruction;Therapeutic activities;Patient/family education;Balance training;Self-care/ADL training    OT Goals(Current goals can be found in the care  plan section) Acute Rehab OT Goals Patient Stated Goal: Get well  OT Goal Formulation: With patient Time For Goal Achievement: 10/28/18 Potential to Achieve Goals: Good ADL Goals Pt Will Perform Lower Body Bathing: with modified independence;sit to/from stand Pt Will Perform Lower Body Dressing: with modified independence;sit to/from stand Pt Will Transfer to Toilet: ambulating;with supervision;bedside commode Pt Will Perform Toileting - Clothing Manipulation and hygiene: with modified independence;sitting/lateral leans;sit to/from stand Pt Will Perform Tub/Shower Transfer: Tub transfer;rolling walker;with supervision;ambulating;shower seat  OT Frequency: Min 2X/week   Barriers to D/C:            Co-evaluation              AM-PAC OT "6 Clicks" Daily Activity     Outcome Measure Help from another person eating meals?: None Help from another person taking care of personal grooming?: None Help from another person toileting, which includes using toliet, bedpan, or urinal?: A Little Help from another person bathing (including washing, rinsing, drying)?: A Little Help from another person to put on and taking off regular upper body clothing?: None Help from another person to put on and taking off regular lower body clothing?: A Little 6 Click Score: 21   End of Session Equipment Utilized During Treatment: Rolling walker;Other (comment)(resdiual limb protector ) Nurse Communication: Mobility status;Patient requests pain meds  Activity Tolerance: Patient tolerated treatment well Patient left: in bed;with call bell/phone within reach;with bed alarm set;with family/visitor present  OT Visit Diagnosis: Other abnormalities of gait and mobility (  R26.89);Pain Pain - Right/Left: Left Pain - part of body: Leg                Time: 1051-1109 OT Time Calculation (min): 18 min Charges:  OT General Charges $OT Visit: 1 Visit OT Evaluation $OT Eval Moderate Complexity: 1 Mod  Chancy Milroy, OT Acute Rehabilitation Services Pager [phone] Office [phone]    Chancy Milroy 10/14/2018, 11:24 AM

## 2018-10-14 NOTE — Plan of Care (Signed)
Acute Rehab goals established. 

## 2018-10-14 NOTE — Evaluation (Signed)
Physical Therapy Evaluation Patient Details Name: Daniel Leon MRN: [id number] DOB: [birth year] Today's Date: 10/14/2018   History of Present Illness  The patient is a 68 year old gentleman with well-controlled type 2 diabetes. S/p Left transtibial amputation.  Clinical Impression  Patient is s/p above surgery resulting in functional limitations due to the deficits listed below (see PT Problem List). Able transfer with close guard assist. Ambulated approx 8 feet in room with RW. Reviewed precautions for post op transtibial amputation. Lots of family support at home. Unsure if he will need to navigate 2 steps or have sons set up a ramp he has in storage. Patient will benefit from skilled PT to increase their independence and safety with mobility to allow discharge to the venue listed below.       Follow Up Recommendations Supervision for mobility/OOB;Home health PT    Equipment Recommendations  None recommended by PT    Recommendations for Other Services OT consult     Precautions / Restrictions Precautions Precautions: Knee;Other (comment)(BKA) Precaution Booklet Issued: Yes (comment) Required Braces or Orthoses: Other Brace Restrictions Weight Bearing Restrictions: Yes LLE Weight Bearing: Non weight bearing      Mobility  Bed Mobility Overal bed mobility: Needs Assistance Bed Mobility: Supine to Sit     Supine to sit: Min guard     General bed mobility comments: Close guard for safety. VC for technique. good strength to mobilize to EOB.  Transfers Overall transfer level: Needs assistance Equipment used: Rolling walker (2 wheeled) Transfers: Sit to/from [employer] Sit to Stand: Min guard Stand pivot transfers: Min guard       General transfer comment: Close guard for safety with transfer. VC for hand placement, technique, walker placement with rise, sit, and pivot to [facility] and Recliner. Cues for forward weight onto RW to prevent posterior  Lean  Ambulation/Gait Ambulation/Gait assistance: Min guard Gait Distance (Feet): 8 Feet Assistive device: Rolling walker (2 wheeled) Gait Pattern/deviations: (Hop) Gait velocity: slow Gait velocity interpretation: <1.31 ft/sec, indicative of household ambulator General Gait Details: Educated on safe DME use with rolling walker. VC for walker placement with steps to prevent posterior lean. good control with RW, no physical assist required.  Stairs            Wheelchair Mobility    Modified Rankin (Stroke Patients Only)       Balance Overall balance assessment: Needs assistance         Standing balance support: Bilateral upper extremity supported Standing balance-Leahy Scale: Poor                               Pertinent Vitals/Pain Pain Assessment: 0-10 Pain Score: 6  Pain Location: Lt residual limb Pain Descriptors / Indicators: Aching;Burning Pain Intervention(s): Limited activity within patient's tolerance;Monitored during session;Premedicated before session;Repositioned    Home Living Family/patient expects to be discharged to:: Private residence Living Arrangements: Spouse/significant other Available Help at Discharge: Family;Available 24 hours/day(2 sons available PRN) Type of Home: House Home Access: Stairs to enter Entrance Stairs-Rails: None Entrance Stairs-Number of Steps: 2 Home Layout: Able to live on main level with bedroom/bathroom Home Equipment: Walker - 2 wheels;Walker - 4 wheels;Bedside commode;Crutches;Wheelchair - manual Additional Comments: Has been using crutches for a while NWB on LT    Prior Function Level of Independence: Independent with assistive device(s)         Comments: Used crutches to ambulate     Hand Dominance  Extremity/Trunk Assessment   Upper Extremity Assessment Upper Extremity Assessment: Defer to OT evaluation    Lower Extremity Assessment Lower Extremity Assessment: LLE  deficits/detail;RLE deficits/detail RLE Sensation: decreased light touch LLE Deficits / Details: As expected post op BKA       Communication   Communication: No difficulties  Cognition Arousal/Alertness: Awake/alert Behavior During Therapy: WFL for tasks assessed/performed Overall Cognitive Status: Within Functional Limits for tasks assessed                                        General Comments General comments (skin integrity, edema, etc.): Reviewed precautions for Lt BKA    Exercises Amputee Exercises Quad Sets: AROM;Left;5 reps;Seated Gluteal Sets: Strengthening;Both;5 reps;Seated Towel Squeeze: Strengthening;Both;5 reps;Seated   Assessment/Plan    PT Assessment Patient needs continued PT services  PT Problem List Decreased strength;Decreased range of motion;Decreased activity tolerance;Decreased balance;Decreased mobility;Decreased knowledge of use of DME;Decreased knowledge of precautions;Pain       PT Treatment Interventions DME instruction;Gait training;Stair training;Functional mobility training;Therapeutic activities;Therapeutic exercise;Balance training;Neuromuscular re-education;Patient/family education    PT Goals (Current goals can be found in the Care Plan section)  Acute Rehab PT Goals Patient Stated Goal: Get well  PT Goal Formulation: With patient Time For Goal Achievement: 10/28/18 Potential to Achieve Goals: Good    Frequency Min 5X/week   Barriers to discharge        Co-evaluation               AM-PAC PT "6 Clicks" Mobility  Outcome Measure Help needed turning from your back to your side while in a flat bed without using bedrails?: None Help needed moving from lying on your back to sitting on the side of a flat bed without using bedrails?: None Help needed moving to and from a bed to a chair (including a wheelchair)?: A Little Help needed standing up from a chair using your arms (e.g., wheelchair or bedside chair)?: A  Little Help needed to walk in hospital room?: A Little Help needed climbing 3-5 steps with a railing? : A Lot 6 Click Score: 19    End of Session Equipment Utilized During Treatment: Gait belt(LE protection brace) Activity Tolerance: Patient tolerated treatment well Patient left: in chair;with call bell/phone within reach Nurse Communication: Mobility status;Other (comment);Precautions(needs larger potty chair) PT Visit Diagnosis: Unsteadiness on feet (R26.81);Difficulty in walking, not elsewhere classified (R26.2);Pain Pain - Right/Left: Left Pain - part of body: Leg    Time: 1275-1700 PT Time Calculation (min) (ACUTE ONLY): 38 min   Charges:   PT Evaluation $PT Eval Moderate Complexity: 1 Mod PT Treatments $Gait Training: 8-22 mins $Therapeutic Activity: 8-22 mins        Charlsie Merles, PT, DPT  [place] 10/14/2018, 10:21 AM

## 2018-10-15 LAB — GLUCOSE, CAPILLARY
GLUCOSE-CAPILLARY: 103 mg/dL — AB (ref 70–99)
Glucose-Capillary: 125 mg/dL — ABNORMAL HIGH (ref 70–99)
Glucose-Capillary: 152 mg/dL — ABNORMAL HIGH (ref 70–99)
Glucose-Capillary: 111 mg/dL — ABNORMAL HIGH (ref 70–99)

## 2018-10-15 MED ORDER — DIPHENHYDRAMINE HCL 25 MG PO CAPS
25.0000 mg | ORAL_CAPSULE | Freq: Four times a day (QID) | ORAL | Status: DC | PRN
  Administered 2018-10-15 (×2): 25 mg via ORAL
  Filled 2018-10-15 (×3): qty 1

## 2018-10-15 MED ORDER — HYDROCODONE-ACETAMINOPHEN 5-325 MG PO TABS
1.0000 | ORAL_TABLET | ORAL | Status: DC | PRN
  Administered 2018-10-15 – 2018-10-17 (×10): 2 via ORAL
  Filled 2018-10-15 (×10): qty 2

## 2018-10-15 NOTE — Progress Notes (Signed)
Stable, no changes today.   Plan for d/c Monday or Tuesday.

## 2018-10-15 NOTE — Progress Notes (Signed)
Physical Therapy Note  Patient suffers from L transtibial amputation which impairs their ability to perform daily activities like walking, functional mobility, activity tolerance  in the home.  A walker alone will not resolve the issues with performing activities of daily living. A lightweight  Wheelchair (with elevating legrests) will allow patient to safely perform daily activities.  The patient can self propel in the home or has a caregiver who can provide assistance.     Van Clines, [place]  Acute Rehabilitation Services Pager [phone] Office [phone]

## 2018-10-15 NOTE — Progress Notes (Signed)
Physical Therapy Treatment Patient Details Name: Daniel Leon MRN: [id number] DOB: September 02, 1950 Today's Date: 10/15/2018    History of Present Illness The patient is a 68 year old gentleman with well-controlled type 2 diabetes. S/p Left transtibial amputation. PMH: HTN, R elbow surgery.     PT Comments    Continuing work on functional mobility and activity tolerance;  Good progress with sit<>stand and hop-steps with RW; Noting reports of dizziness and requesting to sit after a few minutes of upright activity; Checked serial BPs, and noted some orthostatic hypotension:     10/15/18 1200 10/15/18 1205 10/15/18 1207  Vital Signs  Patient Position (if appropriate) Orthostatic Vitals  --   --   Orthostatic Lying   BP- Lying  --   --  140/82  Pulse- Lying  --   --  72  Orthostatic Sitting  BP- Sitting 129/87 103/68 (after attempt to get BP standing; had to sit before BP read)  --   Pulse- Sitting 74 79  --    Symptomatic for dizziness in standing; Will continue to follow      Follow Up Recommendations  Supervision for mobility/OOB;Home health PT     Equipment Recommendations  Wheelchair (measurements PT);Wheelchair cushion (measurements PT)    Recommendations for Other Services       Precautions / Restrictions Precautions Precautions: Other (comment) Precaution Comments: L BKA  Required Braces or Orthoses: Other Brace Other Brace: L residual limb guard  Restrictions Weight Bearing Restrictions: Yes LLE Weight Bearing: Non weight bearing    Mobility  Bed Mobility Overal bed mobility: Needs Assistance Bed Mobility: Sit to Supine       Sit to supine: Supervision   General bed mobility comments: Cues for technique; we discussed getting in different positions in bed for stretching and therex  Transfers Overall transfer level: Needs assistance Equipment used: Rolling walker (2 wheeled) Transfers: Sit to/from Stand Sit to Stand: Min guard         General  transfer comment: min guard for safeyt and balance, good recall with hand placement and walker mgmt   Ambulation/Gait Ambulation/Gait assistance: Min guard Gait Distance (Feet): 15 Feet Assistive device: Rolling walker (2 wheeled) Gait Pattern/deviations: (Press/hop-to pattern) Gait velocity: slow   General Gait Details: Good steps, steady and slow; reported dizziness with incr time in standing/upright activity   Stairs             Wheelchair Mobility    Modified Rankin (Stroke Patients Only)       Balance Overall balance assessment: Needs assistance Sitting-balance support: No upper extremity supported;Feet supported Sitting balance-Leahy Scale: Good     Standing balance support: Bilateral upper extremity supported;During functional activity Standing balance-Leahy Scale: Poor Standing balance comment: reliant on B UE support in standing                             Cognition Arousal/Alertness: Awake/alert Behavior During Therapy: WFL for tasks assessed/performed Overall Cognitive Status: Within Functional Limits for tasks assessed                                        Exercises Other Exercises Other Exercises: Bolstered bridging x5 reps    General Comments General comments (skin integrity, edema, etc.): spouse present and supportive; educated in positions and exercises for stretching and strengthening in preparation for prosthesis  Pertinent Vitals/Pain Pain Assessment: Faces Faces Pain Scale: Hurts little more Pain Location: Lt residual limb Pain Descriptors / Indicators: Aching;Burning Pain Intervention(s): Monitored during session    Home Living                      Prior Function            PT Goals (current goals can now be found in the care plan section) Acute Rehab PT Goals Patient Stated Goal: Get well  PT Goal Formulation: With patient Time For Goal Achievement: 10/28/18 Potential to Achieve  Goals: Good Progress towards PT goals: Progressing toward goals    Frequency    Min 5X/week      PT Plan Current plan remains appropriate    Co-evaluation              AM-PAC PT "6 Clicks" Mobility   Outcome Measure  Help needed turning from your back to your side while in a flat bed without using bedrails?: None Help needed moving from lying on your back to sitting on the side of a flat bed without using bedrails?: None Help needed moving to and from a bed to a chair (including a wheelchair)?: A Little Help needed standing up from a chair using your arms (e.g., wheelchair or bedside chair)?: A Little Help needed to walk in hospital room?: A Little Help needed climbing 3-5 steps with a railing? : A Lot 6 Click Score: 19    End of Session Equipment Utilized During Treatment: Gait belt Activity Tolerance: Patient tolerated treatment well Patient left: in bed;with call bell/phone within reach Nurse Communication: Mobility status;Other (comment) PT Visit Diagnosis: Unsteadiness on feet (R26.81);Difficulty in walking, not elsewhere classified (R26.2);Pain Pain - Right/Left: Left Pain - part of body: Leg     Time: 1150-1230(minus approx 5 minutes to take meds) PT Time Calculation (min) (ACUTE ONLY): 40 min  Charges:  $Gait Training: 8-22 mins $Therapeutic Activity: 8-22 mins                     Van Clines, PT  Acute Rehabilitation Services Pager [phone] Office [phone]    Levi Aland 10/15/2018, 1:32 PM

## 2018-10-15 NOTE — Progress Notes (Signed)
Occupational Therapy Treatment Patient Details Name: Daniel Leon MRN: [id number] DOB: December 02, 1950 Today's Date: 10/15/2018    History of present illness The patient is a 68 year old gentleman with well-controlled type 2 diabetes. S/p Left transtibial amputation. PMH: HTN, R elbow surgery.    OT comments  Patient pleasant and cooperative.  Progressing well with mobility and self care.  Completing basic transfers using RW with min guard, tub transfers to [facility] using RW with min guard assist after education on technique (unable to complete reverse step, but spouse stabilizing chair for pt to sit and turn in).  Reviewed bathing safety seated in shower (after MD clearance), 1 handed techniques for basin bathing standing/toielting/LB dressing. Spouse is highly supportive and is available 24/7.  Will follow.    Follow Up Recommendations  No OT follow up    Equipment Recommendations  3 in 1 bedside commode    Recommendations for Other Services      Precautions / Restrictions Precautions Precautions: Other (comment) Precaution Comments: L BKA  Required Braces or Orthoses: Other Brace Other Brace: L residual limb guard  Restrictions Weight Bearing Restrictions: Yes LLE Weight Bearing: Non weight bearing       Mobility Bed Mobility               General bed mobility comments: OOB in recliner   Transfers Overall transfer level: Needs assistance Equipment used: Rolling walker (2 wheeled) Transfers: Sit to/from Stand Sit to Stand: Min guard         General transfer comment: min guard for safeyt and balance, good recall with hand placement and walker mgmt     Balance Overall balance assessment: Needs assistance Sitting-balance support: No upper extremity supported;Feet supported Sitting balance-Leahy Scale: Good     Standing balance support: Bilateral upper extremity supported;During functional activity Standing balance-Leahy Scale: Poor Standing balance comment:  reliant on B UE support in standing                            ADL either performed or assessed with clinical judgement   ADL Overall ADL's : Needs assistance/impaired             Lower Body Bathing: Min guard;Sitting/lateral leans Lower Body Bathing Details (indicate cue type and reason): reviewed safety with basin bathing until cleared by MD to shower; if standing having 1 handed support otherwise seated with lateral leans  Upper Body Dressing : Set up;Sitting       Toilet Transfer: Min guard;Ambulation;RW Toilet Transfer Details (indicate cue type and reason): simulated, RW  Toileting- Clothing Manipulation and Hygiene: Min guard;Sit to/from stand Toileting - Clothing Manipulation Details (indicate cue type and reason): reviewed 1 handed technique  Tub/ Shower Transfer: Tub transfer;Min guard;Shower seat;Ambulation;Rolling walker Tub/Shower Transfer Details (indicate cue type and reason): educated on reverse step and chair stabilitzation to sit before swinging into tub; unable to to ascend over threshold therefore completed sit and swing method with spouse supporting chair- min guard assist for safety  Functional mobility during ADLs: Rolling walker;Min guard       Vision       Perception     Praxis      Cognition Arousal/Alertness: Awake/alert Behavior During Therapy: WFL for tasks assessed/performed Overall Cognitive Status: Within Functional Limits for tasks assessed  Exercises     Shoulder Instructions       General Comments spouse present and supportive    Pertinent Vitals/ Pain       Pain Assessment: Faces Faces Pain Scale: Hurts little more Pain Location: Lt residual limb Pain Descriptors / Indicators: Aching;Burning Pain Intervention(s): Monitored during session;Repositioned  Home Living                                          Prior Functioning/Environment               Frequency  Min 2X/week        Progress Toward Goals  OT Goals(current goals can now be found in the care plan section)  Progress towards OT goals: Progressing toward goals  Acute Rehab OT Goals Patient Stated Goal: Get well  OT Goal Formulation: With patient Time For Goal Achievement: 10/28/18 Potential to Achieve Goals: Good  Plan Discharge plan remains appropriate;Frequency remains appropriate    Co-evaluation                 AM-PAC OT "6 Clicks" Daily Activity     Outcome Measure   Help from another person eating meals?: None Help from another person taking care of personal grooming?: None Help from another person toileting, which includes using toliet, bedpan, or urinal?: A Little Help from another person bathing (including washing, rinsing, drying)?: A Little Help from another person to put on and taking off regular upper body clothing?: None Help from another person to put on and taking off regular lower body clothing?: A Little 6 Click Score: 21    End of Session Equipment Utilized During Treatment: Rolling walker;Other (comment)(residual limb protector)  OT Visit Diagnosis: Other abnormalities of gait and mobility (R26.89);Pain Pain - Right/Left: Left Pain - part of body: Leg   Activity Tolerance Patient tolerated treatment well   Patient Left in chair;with call bell/phone within reach;with chair alarm set;with family/visitor present   Nurse Communication Mobility status        Time: 7225-7505 OT Time Calculation (min): 27 min  Charges: OT General Charges $OT Visit: 1 Visit OT Treatments $Self Care/Home Management : 23-37 mins  Chancy Milroy, OT Acute Rehabilitation Services Pager [phone] Office [phone]    Chancy Milroy 10/15/2018, 12:23 PM

## 2018-10-16 ENCOUNTER — Encounter (HOSPITAL_COMMUNITY): Payer: Self-pay | Admitting: *Deleted

## 2018-10-16 LAB — GLUCOSE, CAPILLARY
GLUCOSE-CAPILLARY: 121 mg/dL — AB (ref 70–99)
Glucose-Capillary: 96 mg/dL (ref 70–99)
Glucose-Capillary: 122 mg/dL — ABNORMAL HIGH (ref 70–99)
Glucose-Capillary: 123 mg/dL — ABNORMAL HIGH (ref 70–99)

## 2018-10-16 NOTE — Progress Notes (Signed)
Physical medicine rehabilitation consult requested chart reviewed. Patient progressing nicely with both physical and occupational therapy evaluations following recommendations are for home health therapies. Patient will not need inpatient rehabilitation services. Recommendations are discharge to home.

## 2018-10-16 NOTE — Care Management Important Message (Signed)
Important Message  Patient Details  Name: Daniel Leon MRN: [id number] Date of Birth: [birth year]   Medicare Important Message Given:  Yes    Dorena Bodo 10/16/2018, 3:54 PM

## 2018-10-16 NOTE — Progress Notes (Signed)
Physical Therapy Treatment Patient Details Name: Daniel Leon MRN: [id number] DOB: Apr 29, 1951 Today's Date: 10/16/2018    History of Present Illness The patient is a 68 year old gentleman with well-controlled type 2 diabetes. S/p Left transtibial amputation. PMH: HTN, R elbow surgery.     PT Comments    Pt is making good progress towards his goals today, however continues to be limited in safe mobility by L LE pain, change in CoG, and decreased strength and endurance. Pt requires min guard for transfers and ambulation. Ambulation limited by R elbow pain due to decreased ROM. Pt educated on need for improving R LE strength to reduce R UE use and to decrease jarring R knee with hopping. Pt instructed in squats to improve LE strength. Pt reports that sons have installed ramp so house will be accessible with wheelchair. PT will continue to follow acutely.    Follow Up Recommendations  Supervision for mobility/OOB;Home health PT     Equipment Recommendations  Wheelchair (measurements PT);Wheelchair cushion (measurements PT)       Precautions / Restrictions Precautions Precautions: Other (comment) Precaution Comments: L BKA  Required Braces or Orthoses: Other Brace Other Brace: L residual limb guard  Restrictions Weight Bearing Restrictions: Yes LLE Weight Bearing: Non weight bearing    Mobility  Bed Mobility               General bed mobility comments: OOB in recliner on entry  Transfers Overall transfer level: Needs assistance Equipment used: Rolling walker (2 wheeled) Transfers: Sit to/from Stand Sit to Stand: Min guard         General transfer comment: min guard for safety, good power up and reaching to RW  Ambulation/Gait Ambulation/Gait assistance: Min guard Gait Distance (Feet): 20 Feet Assistive device: Rolling walker (2 wheeled) Gait Pattern/deviations: (Press/hop-to pattern) Gait velocity: slow Gait velocity interpretation: <1.8 ft/sec, indicate of  risk for recurrent falls General Gait Details: Good steps, steady and slow;reported increase in R elbow pain with pushing off with RW       Balance Overall balance assessment: Needs assistance Sitting-balance support: No upper extremity supported;Feet supported Sitting balance-Leahy Scale: Good     Standing balance support: Bilateral upper extremity supported;During functional activity Standing balance-Leahy Scale: Poor Standing balance comment: reliant on B UE support in standing                             Cognition Arousal/Alertness: Awake/alert Behavior During Therapy: WFL for tasks assessed/performed Overall Cognitive Status: Within Functional Limits for tasks assessed                                        Exercises Other Exercises Other Exercises: squats with RW supportx10     General Comments General comments (skin integrity, edema, etc.): Pt educated on need for maintainence of L knee extension for future prosthetic placement. Also educated on need for strengthening of L LE to assist in hopping due to decreased function of L elbow and increased pain with its use.       Pertinent Vitals/Pain Pain Assessment: 0-10 Pain Score: 2  Pain Location: Lt residual limb Pain Descriptors / Indicators: Aching;Burning Pain Intervention(s): Limited activity within patient's tolerance;Monitored during session;Repositioned           PT Goals (current goals can now be found in the care plan section) Acute Rehab  PT Goals Patient Stated Goal: Get well  PT Goal Formulation: With patient Time For Goal Achievement: 10/28/18 Potential to Achieve Goals: Good Progress towards PT goals: Progressing toward goals    Frequency    Min 5X/week      PT Plan Current plan remains appropriate       AM-PAC PT "6 Clicks" Mobility   Outcome Measure  Help needed turning from your back to your side while in a flat bed without using bedrails?: None Help  needed moving from lying on your back to sitting on the side of a flat bed without using bedrails?: None Help needed moving to and from a bed to a chair (including a wheelchair)?: A Little Help needed standing up from a chair using your arms (e.g., wheelchair or bedside chair)?: A Little Help needed to walk in hospital room?: A Little Help needed climbing 3-5 steps with a railing? : A Lot 6 Click Score: 19    End of Session Equipment Utilized During Treatment: Gait belt Activity Tolerance: Patient tolerated treatment well Patient left: with call bell/phone within reach;in chair Nurse Communication: Mobility status;Other (comment) PT Visit Diagnosis: Unsteadiness on feet (R26.81);Difficulty in walking, not elsewhere classified (R26.2);Pain Pain - Right/Left: Left Pain - part of body: Leg     Time: 5521-7471 PT Time Calculation (min) (ACUTE ONLY): 31 min  Charges:  $Gait Training: 8-22 mins $Therapeutic Exercise: 8-22 mins                     Daniel Leon Daniel Leon PT, DPT Acute Rehabilitation Services Pager [phone] Office [phone]    Daniel Leon 10/16/2018, 11:32 AM

## 2018-10-16 NOTE — Progress Notes (Signed)
Patient ID: Daniel Leon, male   DOB: 12/15/1950, 68 y.o.   MRN: [id number] Patient has made excellent progress with physical therapy.  Anticipate discharge to home tomorrow or Tuesday with home health physical therapy as well as a potential aide.  There is no drainage in the wound VAC canister will transition to the portable Praveena pump at discharge with follow-up in the office next Monday.

## 2018-10-17 ENCOUNTER — Telehealth (INDEPENDENT_AMBULATORY_CARE_PROVIDER_SITE_OTHER): Payer: Self-pay

## 2018-10-17 LAB — GLUCOSE, CAPILLARY
Glucose-Capillary: 119 mg/dL — ABNORMAL HIGH (ref 70–99)
Glucose-Capillary: 127 mg/dL — ABNORMAL HIGH (ref 70–99)
Glucose-Capillary: 146 mg/dL — ABNORMAL HIGH (ref 70–99)

## 2018-10-17 MED ORDER — HYDROCODONE-ACETAMINOPHEN 5-325 MG PO TABS: 1.0000 | ORAL_TABLET | ORAL | 0 refills | Status: DC | PRN

## 2018-10-17 MED ORDER — OXYCODONE-ACETAMINOPHEN 5-325 MG PO TABS: 1.0000 | ORAL_TABLET | ORAL | 0 refills | Status: DC | PRN

## 2018-10-17 NOTE — Discharge Summary (Signed)
Discharge Diagnoses:  Active Problems:   Charcot foot due to diabetes mellitus (HCC)   S/P BKA (below knee amputation), left (HCC)   Surgeries: Procedure(s): LEFT BELOW KNEE AMPUTATION on 10/13/2018    Consultants:   Discharged Condition: Improved  Hospital Course: Daniel Leon is an 68 y.o. male who was admitted 10/13/2018 with a chief complaint of charcot collapse left ankle, with a final diagnosis of Charcot Collapse Left Ankle.  Patient was brought to the operating room on 10/13/2018 and underwent Procedure(s): LEFT BELOW KNEE AMPUTATION.    Patient was given perioperative antibiotics:  Anti-infectives (From admission, onward)   Start     Dose/Rate Route Frequency Ordered Stop   10/13/18 0800  ceFAZolin (ANCEF) 3 g in dextrose 5 % 50 mL IVPB     3 g 100 mL/hr over 30 Minutes Intravenous To Surgery 10/12/18 1048 10/13/18 0903    .  Patient was given sequential compression devices, early ambulation, and aspirin for DVT prophylaxis.  Recent vital signs:  Patient Vitals for the past 24 hrs:  BP Temp Temp src Pulse Resp SpO2  10/17/18 0441 135/78 98.1 F (36.7 C) Oral 77 14 93 %  10/16/18 2219 137/75 98.1 F (36.7 C) Oral 81 - 92 %  10/16/18 1459 (!) 143/77 97.8 F (36.6 C) Oral 87 16 96 %  .  Recent laboratory studies: No results found.  Discharge Medications:   Allergies as of 10/17/2018      Reactions   Oxycodone Itching      Medication List    STOP taking these medications   acetaminophen 500 MG tablet Commonly known as:  TYLENOL     TAKE these medications   aspirin EC 81 MG tablet Take 81 mg by mouth daily.   celecoxib 200 MG capsule Commonly known as:  CELEBREX Take 200 mg by mouth at bedtime.   diclofenac sodium 1 % Gel Commonly known as:  VOLTAREN Apply 1 application topically 4 (four) times daily as needed (pain).   gabapentin 400 MG capsule Commonly known as:  NEURONTIN Take 400 mg by mouth See admin instructions. Take 400 mg twice  daily, may take a 3rd 400 mg dose as needed for pain   lisinopril 10 MG tablet Commonly known as:  PRINIVIL,ZESTRIL Take 1 tablet (10 mg total) by mouth daily.   multivitamin tablet Take 1 tablet by mouth daily.   oxyCODONE-acetaminophen 5-325 MG tablet Commonly known as:  PERCOCET/ROXICET Take 1-2 tablets by mouth every 4 (four) hours as needed.   Vitamin D3 125 MCG (5000 UT) Caps Take 5,000 Units by mouth daily.            Durable Medical Equipment  (From admission, onward)         Start     Ordered   10/14/18 1500  For home use only DME 3 n 1  Once     10/14/18 1459          Diagnostic Studies: No results found.  Patient benefited maximally from their hospital stay and there were no complications.     Disposition:  Discharge Instructions    Negative Pressure Wound Therapy - Incisional   Complete by:  As directed      Follow-up Information    [name], MD In 1 week.   Specialty:  Orthopedic Surgery Contact information: [address] [place] [address] [phone]        [facility] Follow up.   Specialty:  Home Health Services Why:  Agency will contact you to arrange physical therapy appointments.  Contact information: [address] [place] [address] [phone]            Signed: [name] 10/17/2018, 6:39 AM

## 2018-10-17 NOTE — Progress Notes (Signed)
Physical Therapy Treatment Patient Details Name: Daniel Leon MRN: [id number] DOB: 1950/11/29 Today's Date: 10/17/2018    History of Present Illness The patient is a 68 year old gentleman with well-controlled type 2 diabetes. S/p Left transtibial amputation. PMH: HTN, R elbow surgery.     PT Comments    Pt making good progress towards goals today. Focus of session was wheelchair transfers, management and propulsion which pt was able to complete at a supervision level. PT answered questions regarding exercise and HHPT. PT is ready for d/c home this afternoon.    Follow Up Recommendations  Supervision for mobility/OOB;Home health PT     Equipment Recommendations  Wheelchair (measurements PT);Wheelchair cushion (measurements PT)       Precautions / Restrictions Precautions Precautions: Other (comment) Precaution Comments: L BKA  Required Braces or Orthoses: Other Brace Other Brace: L residual limb guard  Restrictions Weight Bearing Restrictions: Yes LLE Weight Bearing: Non weight bearing    Mobility  Bed Mobility Overal bed mobility: Modified Independent             General bed mobility comments: use of bed rail to pull to EoB  Transfers Overall transfer level: Needs assistance Equipment used: Rolling walker (2 wheeled) Transfers: Sit to/from Stand Sit to Stand: Supervision         General transfer comment: supervision for safety, good power up and steadying with RW  Ambulation/Gait Ambulation/Gait assistance: Supervision Gait Distance (Feet): 5 Feet Assistive device: Rolling walker (2 wheeled) Gait Pattern/deviations: (Press/hop-to pattern) Gait velocity: slow Gait velocity interpretation: <1.31 ft/sec, indicative of household ambulator General Gait Details: Good steps, steady and slow   [employer] officer mobility: Yes Wheelchair propulsion: Both upper extremities;Right lower extremity Wheelchair parts:  Supervision/cueing Distance: 200 Wheelchair Assistance Details (indicate cue type and reason): educated on use of brakes, LE rests, propulsion and turning     Balance Overall balance assessment: Needs assistance Sitting-balance support: No upper extremity supported;Feet supported Sitting balance-Leahy Scale: Good     Standing balance support: Bilateral upper extremity supported;During functional activity Standing balance-Leahy Scale: Fair Standing balance comment: able to static stand before reaching for RW                            Cognition Arousal/Alertness: Awake/alert Behavior During Therapy: [facility] for tasks assessed/performed Overall Cognitive Status: Within Functional Limits for tasks assessed                                           General Comments General comments (skin integrity, edema, etc.): Nurse in room changed, over to battery operated wound vac      Pertinent Vitals/Pain Pain Assessment: Faces Faces Pain Scale: Hurts a little bit Pain Location: Lt residual limb Pain Descriptors / Indicators: Aching;Burning Pain Intervention(s): Limited activity within patient's tolerance;Monitored during session;Repositioned           PT Goals (current goals can now be found in the care plan section) Acute Rehab PT Goals Patient Stated Goal: Get well  PT Goal Formulation: With patient Time For Goal Achievement: 10/28/18 Potential to Achieve Goals: Good Progress towards PT goals: Progressing toward goals    Frequency    Min 5X/week      PT Plan Current plan remains appropriate       AM-PAC PT "6 Clicks" Mobility   Outcome Measure  Help needed turning from your back to your side while in a flat bed without using bedrails?: None Help needed moving from lying on your back to sitting on the side of a flat bed without using bedrails?: None Help needed moving to and from a bed to a chair (including a wheelchair)?: A Little Help  needed standing up from a chair using your arms (e.g., wheelchair or bedside chair)?: A Little Help needed to walk in hospital room?: A Little Help needed climbing 3-5 steps with a railing? : A Lot 6 Click Score: 19    End of Session Equipment Utilized During Treatment: Gait belt Activity Tolerance: Patient tolerated treatment well Patient left: with call bell/phone within reach;in chair Nurse Communication: Mobility status;Other (comment) PT Visit Diagnosis: Unsteadiness on feet (R26.81);Difficulty in walking, not elsewhere classified (R26.2);Pain Pain - Right/Left: Left Pain - part of body: Leg     Time: 8242-3536 PT Time Calculation (min) (ACUTE ONLY): 22 min  Charges:  $Wheel Chair Management: 8-22 mins                      Alvaretta Eisenberger B. Beverely Risen PT, DPT Acute Rehabilitation Services Pager [phone] Office [phone]    [name] Fleet 10/17/2018, 2:03 PM

## 2018-10-17 NOTE — Progress Notes (Signed)
Pt is anxious to go home. Left BKA wound vac dressing dry and intact, connected to Prevena wound vac. Discharge instructions given to pt, verbalized understanding. Pt cannot wait for the new wheelchair. He wants it delivered to his house. Darl Pikes CM notified. Discharged to home accompanied by his wife.

## 2018-10-17 NOTE — Care Management Note (Signed)
Case Management Note  Patient Details  Name: Daniel Leon MRN: [id number] Date of Birth: 21-Nov-1950  Subjective/Objective:                    Action/Plan:  Confirmed with Kandee Keen with [facility] patient was accepted for HHPT and aide.  Nurse requesting wheelchair. PT recommended wheelchair.   NCM placed order and progress note both need to be signed by DR Lajoyce Corners. Left DR Lajoyce Corners a voicemail. Called Brad with [facility] to order wheelchair.   Bedside nurse aware. Expected Discharge Date:  10/17/18               Expected Discharge Plan:  Home w Home Health Services  In-House Referral:  NA  Discharge planning Services  CM Consult  Post Acute Care Choice:  Durable Medical Equipment, Home Health Choice offered to:  Patient  DME Arranged:  3-N-1, Wheelchair manual DME Agency:  [facility]  HH Arranged:  PT, Nurse's Aide HH Agency:  [facility]  Status of Service:  In process, will continue to follow  If discussed at Long Length of Stay Meetings, dates discussed:    Additional Comments:  Kingsley Plan, RN 10/17/2018, 12:57 PM

## 2018-10-17 NOTE — Discharge Summary (Signed)
Discharge Diagnoses:  Active Problems:   Charcot foot due to diabetes mellitus (HCC)   S/P BKA (below knee amputation), left (HCC)   Surgeries: Procedure(s): LEFT BELOW KNEE AMPUTATION on 10/13/2018    Consultants:   Discharged Condition: Improved  Hospital Course: Daniel Leon is an 68 y.o. male who was admitted 10/13/2018 with a chief complaint of charcot collapse left ankle, with a final diagnosis of Charcot Collapse Left Ankle.  Patient was brought to the operating room on 10/13/2018 and underwent Procedure(s): LEFT BELOW KNEE AMPUTATION.    Patient was given perioperative antibiotics:  Anti-infectives (From admission, onward)   Start     Dose/Rate Route Frequency Ordered Stop   10/13/18 0800  ceFAZolin (ANCEF) 3 g in dextrose 5 % 50 mL IVPB     3 g 100 mL/hr over 30 Minutes Intravenous To Surgery 10/12/18 1048 10/13/18 0903    .  Patient was given sequential compression devices, early ambulation, and aspirin for DVT prophylaxis.  Recent vital signs:  Patient Vitals for the past 24 hrs:  BP Temp Temp src Pulse Resp SpO2  10/17/18 1432 (!) 144/82 97.9 F (36.6 C) Oral 91 18 95 %  10/17/18 1102 127/72 98 F (36.7 C) Oral 78 16 94 %  10/17/18 0738 131/72 97.9 F (36.6 C) Oral 74 16 94 %  10/17/18 0441 135/78 98.1 F (36.7 C) Oral 77 14 93 %  10/16/18 2219 137/75 98.1 F (36.7 C) Oral 81 - 92 %  10/16/18 1459 (!) 143/77 97.8 F (36.6 C) Oral 87 16 96 %  .  Recent laboratory studies: No results found.  Discharge Medications:   Allergies as of 10/17/2018      Reactions   Oxycodone Itching      Medication List    STOP taking these medications   acetaminophen 500 MG tablet Commonly known as:  TYLENOL     TAKE these medications   aspirin EC 81 MG tablet Take 81 mg by mouth daily.   celecoxib 200 MG capsule Commonly known as:  CELEBREX Take 200 mg by mouth at bedtime.   diclofenac sodium 1 % Gel Commonly known as:  VOLTAREN Apply 1 application  topically 4 (four) times daily as needed (pain).   gabapentin 400 MG capsule Commonly known as:  NEURONTIN Take 400 mg by mouth See admin instructions. Take 400 mg twice daily, may take a 3rd 400 mg dose as needed for pain   HYDROcodone-acetaminophen 5-325 MG tablet Commonly known as:  NORCO/VICODIN Take 1-2 tablets by mouth every 4 (four) hours as needed for moderate pain.   lisinopril 10 MG tablet Commonly known as:  PRINIVIL,ZESTRIL Take 1 tablet (10 mg total) by mouth daily.   multivitamin tablet Take 1 tablet by mouth daily.   Vitamin D3 125 MCG (5000 UT) Caps Take 5,000 Units by mouth daily.            Durable Medical Equipment  (From admission, onward)         Start     Ordered   10/17/18 1250  For home use only DME standard manual wheelchair with seat cushion  Once    Comments:  Patient suffers from  Charcot Collapse Left Ankle which impairs their ability to perform daily activities like ambulating  in the home.  A cane  will not resolve  issue with performing activities of daily living. A wheelchair will allow patient to safely perform daily activities. Patient can safely propel the wheelchair in the home  or has a caregiver who can provide assistance.  Accessories: elevating leg rests (ELRs), wheel locks, extensions and anti-tippers.   10/17/18 1249   10/14/18 1500  For home use only DME 3 n 1  Once     10/14/18 1459          Diagnostic Studies: No results found.  Patient benefited maximally from their hospital stay and there were no complications.     Disposition: Discharge disposition: 01-Home or Self Care      Discharge Instructions    Call MD / Call 911   Complete by:  As directed    If you experience chest pain or shortness of breath, CALL 911 and be transported to the hospital emergency room.  If you develope a fever above 101 F, pus (white drainage) or increased drainage or redness at the wound, or calf pain, call your surgeon's office.    Constipation Prevention   Complete by:  As directed    Drink plenty of fluids.  Prune juice may be helpful.  You may use a stool softener, such as Colace (over the counter) 100 mg twice a day.  Use MiraLax (over the counter) for constipation as needed.   Diet - low sodium heart healthy   Complete by:  As directed    Increase activity slowly as tolerated   Complete by:  As directed    Negative Pressure Wound Therapy - Incisional   Complete by:  As directed      Follow-up Information    [name], MD In 1 week.   Specialty:  Orthopedic Surgery Contact information: [address] [place] [address] [phone]        [facility] Follow up.   Specialty:  Home Health Services Why:  Agency will contact you to arrange physical therapy appointments.  Contact information: [address] [place] [address] [phone]            Signed: [name] 10/17/2018, 2:49 PM

## 2018-10-17 NOTE — Progress Notes (Signed)
Occupational Therapy Treatment Patient Details Name: Daniel Leon MRN: [id number] DOB: 17-Mar-1951 Today's Date: 10/17/2018    History of present illness The patient is a 68 year old gentleman with well-controlled type 2 diabetes. S/p Left transtibial amputation. PMH: HTN, R elbow surgery.    OT comments  Pt continues to make good progress towards OT goals, presents sitting up in recliner pleasant and willing to participate in therapy session. Pt completing LB dressing during session with minA for task completion; pt spouse present and participating in session in anticipation for assisting with ADL tasks after return home. Reviewed additional safety and compensatory strategies for performing ADL and functional transfers after return home with pt/pt's spouse both verbalizing good understanding. Pt anticipating discharge home sometime today. Will continue to follow while he remains in acute setting to progress pt towards established OT goals.    Follow Up Recommendations  No OT follow up    Equipment Recommendations  3 in 1 bedside commode;Wheelchair (measurements OT);Wheelchair cushion (measurements OT)          Precautions / Restrictions Precautions Precautions: Other (comment) Precaution Comments: L BKA  Required Braces or Orthoses: Other Brace Other Brace: L residual limb guard  Restrictions Weight Bearing Restrictions: Yes LLE Weight Bearing: Non weight bearing       Mobility Bed Mobility Overal bed mobility: Modified Independent             General bed mobility comments: Pt received up in recliner  Transfers Overall transfer level: Needs assistance Equipment used: Rolling walker (2 wheeled) Transfers: Sit to/from Stand Sit to Stand: Supervision         General transfer comment: supervision for safety, good power up and steadying with RW    Balance Overall balance assessment: Needs assistance Sitting-balance support: No upper extremity supported;Feet  supported Sitting balance-Leahy Scale: Good     Standing balance support: Bilateral upper extremity supported;During functional activity Standing balance-Leahy Scale: Fair Standing balance comment: able to static stand before reaching for RW                           ADL either performed or assessed with clinical judgement   ADL Overall ADL's : Needs assistance/impaired Eating/Feeding: Modified independent;Sitting               Upper Body Dressing : Set up;Sitting   Lower Body Dressing: Minimal assistance;Sit to/from stand;With caregiver independent assisting Lower Body Dressing Details (indicate cue type and reason): pt donning underwear and pants during session, spouse also assisting to practice in prep for completing at home, pt able to thread LEs into pantlegs with spouse assisting to advance over hips in standing, minguard assist provided by therapist for standing balance as well. educated both pt/pt's spouse in management of wound vac with LE garments; also educated on use of lateral leans seated EOB as method to complete             Functional mobility during ADLs: Rolling walker;Min guard General ADL Comments: reviewed safety and compensatory strategies for performing ADL and functional transfers both in seated and in standing with single UE support; educated in skin checks of L residual limb and methods to do so; emphasized maintaining LLE knee in extension resting on towel roll if taking a break from limb guard while seated, reinforced need to have limb guard on with mobility      Vision       Perception     Praxis  Cognition Arousal/Alertness: Awake/alert Behavior During Therapy: WFL for tasks assessed/performed Overall Cognitive Status: Within Functional Limits for tasks assessed                                          Exercises     Shoulder Instructions       General Comments spouse present and engaged during session     Pertinent Vitals/ Pain       Pain Assessment: Faces Faces Pain Scale: Hurts a little bit Pain Location: Lt residual limb Pain Descriptors / Indicators: Aching;Burning Pain Intervention(s): Limited activity within patient's tolerance;Monitored during session;Repositioned  Home Living                                          Prior Functioning/Environment              Frequency  Min 2X/week        Progress Toward Goals  OT Goals(current goals can now be found in the care plan section)  Progress towards OT goals: Progressing toward goals  Acute Rehab OT Goals Patient Stated Goal: home today OT Goal Formulation: With patient Time For Goal Achievement: 10/28/18 Potential to Achieve Goals: Good  Plan Discharge plan remains appropriate;Frequency remains appropriate    Co-evaluation                 AM-PAC OT "6 Clicks" Daily Activity     Outcome Measure   Help from another person eating meals?: None Help from another person taking care of personal grooming?: None Help from another person toileting, which includes using toliet, bedpan, or urinal?: A Little Help from another person bathing (including washing, rinsing, drying)?: A Little Help from another person to put on and taking off regular upper body clothing?: None Help from another person to put on and taking off regular lower body clothing?: A Little 6 Click Score: 21    End of Session Equipment Utilized During Treatment: Rolling walker;Other (comment)(L limb guard)  OT Visit Diagnosis: Other abnormalities of gait and mobility (R26.89);Pain Pain - Right/Left: Left Pain - part of body: Leg   Activity Tolerance Patient tolerated treatment well   Patient Left in chair;with call bell/phone within reach;with family/visitor present   Nurse Communication Mobility status        Time: 1234-1300 OT Time Calculation (min): 26 min  Charges: OT General Charges $OT Visit: 1 Visit OT  Treatments $Self Care/Home Management : 23-37 mins  Marcy Siren, OT Supplemental Rehabilitation Services Pager [phone] Office [phone]    Orlando Penner 10/17/2018, 2:04 PM

## 2018-10-17 NOTE — Care Management (Cosign Needed)
    Durable Medical Equipment  (From admission, onward)         Start     Ordered   10/17/18 1250  For home use only DME standard manual wheelchair with seat cushion  Once    Comments:  Patient suffers from  Charcot Collapse Left Ankle which impairs their ability to perform daily activities like ambulating  in the home.  A cane  will not resolve  issue with performing activities of daily living. A wheelchair will allow patient to safely perform daily activities. Patient can safely propel the wheelchair in the home or has a caregiver who can provide assistance.  Accessories: elevating leg rests (ELRs), wheel locks, extensions and anti-tippers.   10/17/18 1249   10/14/18 1500  For home use only DME 3 n 1  Once     10/14/18 1459

## 2018-10-17 NOTE — Telephone Encounter (Signed)
Needing d/c orders for this pt asap, also needs narcotic order changed to Vicodin because pt is allergic to Percocet. Needs wheelchair order signed as well.

## 2018-10-17 NOTE — Consult Note (Signed)
            [facility] CM Primary Care Navigator  10/17/2018  Daniel Leon 01/03/1951 [id number]   Went to see patient at the bedside to identify possible discharge needs but he wasalreadydischargedper RN report ("very anxious to go home"). Patient went home with home health services.   Per MD note,patient wasadmittedwith a chief complaint of charcot collapse left ankle (Charcot Collapse Left Ankle, underwent left below the knee amputation.  Patient has discharge instruction to follow-up withorthopedic surgery in 1 week.  Primary care provider's office is listed as providing transition of care (TOC).   For additional questions please contact:  Karin Golden A. Lovelyn Sheeran, BSN, RN-BC [facility] PRIMARY CARE Navigator Cell: [phone]

## 2018-10-18 ENCOUNTER — Telehealth (INDEPENDENT_AMBULATORY_CARE_PROVIDER_SITE_OTHER): Payer: Self-pay | Admitting: Orthopedic Surgery

## 2018-10-18 ENCOUNTER — Telehealth (INDEPENDENT_AMBULATORY_CARE_PROVIDER_SITE_OTHER): Payer: Self-pay

## 2018-10-18 NOTE — Telephone Encounter (Signed)
Pt is s/p a bka on 10/13/2018 pt is wearing a wound vac covered with coban and he states that this is cutting into his leg and wants to cut the top of the coban to release the pressure. I advised the pt should come into the office today for evaluation and we can remove the dressing. Pt does not want to drive her and states that he may have a HHN or his daughter in law who is a PA come to the house to remove the outer dressing as he does not want to do it himself. Advised that if he can not have some one do this for him he will come into the office today.  He will cal back to advise.

## 2018-10-18 NOTE — Telephone Encounter (Signed)
Pt called saying he is asking an issue with his bandages it is cutting into his leg and making it very red and painful.  I advised pt to come into the office. But he didn't want to make that drive.  I Transferred pt to Autumn to advise further instructions.

## 2018-10-18 NOTE — Telephone Encounter (Signed)
[facility] [phone]   [facility] called wanted to inform Dr.Duda that there is a red area behind his thigh/leg on the left side. PT would like patient to see someone tomorrow if possible for a rewrap.

## 2018-10-18 NOTE — Telephone Encounter (Signed)
Call documented in chart awaiting his return call to advise if home health will come to see him today or if he will come in and make an appt.

## 2018-10-19 ENCOUNTER — Encounter (INDEPENDENT_AMBULATORY_CARE_PROVIDER_SITE_OTHER): Payer: Self-pay | Admitting: Orthopedic Surgery

## 2018-10-19 ENCOUNTER — Ambulatory Visit (INDEPENDENT_AMBULATORY_CARE_PROVIDER_SITE_OTHER): Payer: Medicare Other | Admitting: Physician Assistant

## 2018-10-19 ENCOUNTER — Telehealth: Payer: Self-pay | Admitting: Family Medicine

## 2018-10-19 VITALS — Ht 76.0 in | Wt 250.0 lb

## 2018-10-19 DIAGNOSIS — E11610 Type 2 diabetes mellitus with diabetic neuropathic arthropathy: Secondary | ICD-10-CM

## 2018-10-19 DIAGNOSIS — Z89512 Acquired absence of left leg below knee: Secondary | ICD-10-CM

## 2018-10-19 NOTE — Telephone Encounter (Signed)
I called sw pt to advise that he needs to come into the office today. Pt states that he has " a lot of things going on today and does not know if he can come in" I advised that I strongly suggest that he come in this afternoon to evaluate for post operative changes. He states that he will call back in an hour and let us know if he can come. Advised pt to call and select the option for triage for a work in appt.

## 2018-10-19 NOTE — Progress Notes (Signed)
Office Visit Note   Patient: Daniel Leon           Date of Birth: 05-27-1951           MRN: [id number] Visit Date: 10/19/2018              Requested by: Steele Sizer, MD [address] [place], [place] [address] PCP: Steele Sizer, MD  Chief Complaint  Patient presents with  . Left Leg - Routine Post Op    10/13/2018 BKA D/C'd vac today. Vive shrinker and limb protector [employer]       HPI: The patient is a 68 year old gentleman who is seen for postoperative follow-up following a left transtibial amputation on 10/13/2022 severe Charcot collapse of the left foot and ankle despite attempts and internal fixation in the past.  He has severe diabetic insensate neuropathy.  The Praveena VAC was placed postoperatively and he presents today for removal of the VAC.  He does have a stump shrinker stocking and limb protector and is working with [facility].  Assessment & Plan: Visit Diagnoses:  1. Acquired absence of left lower extremity below knee (HCC)   2. Diabetic Charct's arthropathy (HCC)     Plan: The Praveena VAC was removed.  He was instructed to wash the residual limb with Dial soap and water daily and place his stump shrinker stocking in direct contact with the skin.  Continue limb protector.  He was given a piece of Mepilex border to utilize over the medial thigh where he had some bruising and abrasion from his postoperative dressings.  He will follow-up next week for recheck or sooner should he have difficulties in the interim.  Follow-Up Instructions: Return in about 1 week (around 10/26/2018).   Ortho Exam  Patient is alert, oriented, no adenopathy, well-dressed, normal affect, normal respiratory effort. The left transtibial amputation site- VAC was removed and there is minimal serosanguineous drainage from the incisional area.  Staples are intact.  There is no erythema or signs of infection.  He has full knee extension and good flexion.  He does have some  bruising/abrasion over the medial thigh area and will go to use some Mepilex border to protect this.  Imaging: No results found.   Labs: Lab Results  Component Value Date   HGBA1C 5.7 (H) 10/13/2018   HGBA1C 5.8 07/13/2018   HGBA1C 5.9 07/28/2017     Lab Results  Component Value Date   ALBUMIN 4.0 01/26/2018   ALBUMIN 4.4 12/29/2016   ALBUMIN 4.2 12/24/2015    Body mass index is 30.43 kg/m.  Orders:  No orders of the defined types were placed in this encounter.  No orders of the defined types were placed in this encounter.    Procedures: No procedures performed  Clinical Data: No additional findings.  ROS:  All other systems negative, except as noted in the HPI. Review of Systems  Objective: Vital Signs: Ht 6\' 4"  (1.93 m)   Wt 250 lb (113.4 kg)   BMI 30.43 kg/m   Specialty Comments:  No specialty comments available.  PMFS History: Patient Active Problem List   Diagnosis Date Noted  . S/P BKA (below knee amputation), left (HCC) 10/13/2018  . Charcot foot due to diabetes mellitus (HCC) 01/26/2018  . PE (physical exam), annual 12/29/2016  . Neuropathy 12/29/2016  . BPH (benign prostatic hyperplasia) 12/29/2016  . Advanced care planning/counseling discussion 12/29/2016  . DM type 2 with diabetic peripheral neuropathy (HCC) 06/30/2016  . Anemia  07/28/2015  . Essential hypertension    Past Medical History:  Diagnosis Date  . 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 Charcot ankle, left    collapse  . Charcot's joint of foot   . Diabetes mellitus without complication (HCC)    DIET-CONTROLLED  . Early cataracts, bilateral   . Essential hypertension   . Right bundle branch block (RBBB)   . Traumatic arthropathy of ankle and foot   . Wears glasses     Family History  Problem Relation Age of Onset  . Heart disease Mother   . Hypertension Mother   . Heart disease Father   . Hypertension Father   . Hypertension Brother   . Prostate  cancer Neg Hx   . Bladder Cancer Neg Hx   . Kidney cancer Neg Hx     Past Surgical History:  Procedure Laterality Date  . AMPUTATION Left 10/13/2018   Procedure: LEFT BELOW KNEE AMPUTATION;  Surgeon: [name], MD;  Location: [facility] OR;  Service: Orthopedics;  Laterality: Left;  . COLONOSCOPY    . ELBOW SURGERY Right 1966  . FOOT ARTHRODESIS, SUBTALAR Right 2019  . TONSILLECTOMY    . TOTAL ANKLE REPLACEMENT    . ULNAR NERVE TRANSPOSITION Right 09/05/2015   Procedure: ULNAR NERVE DECOMPRESSION/TRANSPOSITION;  Surgeon: [name], MD;  Location: [facility] ORS;  Service: Orthopedics;  Laterality: Right;   Social History   Occupational History  . Not on file  Tobacco Use  . Smoking status: Former Smoker    Packs/day: 0.50    Years: 10.00    Pack years: 5.00    Types: Cigarettes    Last attempt to quit: 07/27/2000    Years since quitting: 18.2  . Smokeless tobacco: Never Used  Substance and Sexual Activity  . Alcohol use: Yes    Comment: very little  . Drug use: No  . Sexual activity: Not on file

## 2018-10-19 NOTE — Telephone Encounter (Signed)
Verbal orders given.   Also asked if PT 2 x week for 3 weeks, then 1 x week for 2 weeks is okay.

## 2018-10-19 NOTE — Telephone Encounter (Signed)
That is fine to do

## 2018-10-19 NOTE — Telephone Encounter (Signed)
OK to give verbal orders 

## 2018-10-19 NOTE — Telephone Encounter (Signed)
Pt is in the office today.  

## 2018-10-19 NOTE — Telephone Encounter (Signed)
I called and sw physical therapist and he states that he thinks the coban is irritating leg from friction with therapy and that he "folded it down" and thinks that this will take the pressure off. The pt wants a HHN to come and remove the dressing and reapply. I advised that the pt is wearing a wound vac and that if there is no malfunction with the vac then this should remain intact until first post op appt. There is no order for [facility] at this moment as there is no dressing to apply until after this is removed. We can update orders for this pt to have HHN if there is a need after the vac has been d/c and limb evaluated in the office.

## 2018-10-19 NOTE — Telephone Encounter (Signed)
Verbal orders given  

## 2018-10-19 NOTE — Telephone Encounter (Signed)
Copied from CRM [id number]. Topic: Quick Communication - Home Health Verbal Orders >> Oct 18, 2018  4:35 PM Trula Slade wrote: Caller/Agency:   Melinda Crutch Callback Number:   [phone] Requesting OT/PT/Skilled Nursing/Social Work:    The patient has a concern about his dressing around his amputation.  He would like a nurse to check this.   Frequency:   Evaluation

## 2018-10-20 ENCOUNTER — Telehealth (INDEPENDENT_AMBULATORY_CARE_PROVIDER_SITE_OTHER): Payer: Self-pay

## 2018-10-20 NOTE — Telephone Encounter (Signed)
He should wear his stump shrinker and the limb protector and can change the shrinker daily. To call with any questions. No skilled need for nursing right now.

## 2018-10-20 NOTE — Telephone Encounter (Signed)
PT called with concerns about the shrinker and there not being a dressings on the incision. I advised that the shrinker is impregnated with a nano silver and this is good for healing as well as compression. He should wear the

## 2018-10-23 ENCOUNTER — Inpatient Hospital Stay (INDEPENDENT_AMBULATORY_CARE_PROVIDER_SITE_OTHER): Payer: Medicare Other | Admitting: Orthopedic Surgery

## 2018-10-24 ENCOUNTER — Telehealth (INDEPENDENT_AMBULATORY_CARE_PROVIDER_SITE_OTHER): Payer: Self-pay

## 2018-10-24 NOTE — Telephone Encounter (Signed)
I received an email from patient relations about this pt and his need for a wheelchair. I called the pt and he had avised that a case manager had ordered this for him and he had to leave the hospital before the wheelchair could be delivered to him. He had been advised that for 75$ they could have the wheelchair sent to his home the pt agreed to this and left the hospital. The wheelchair never arrived. The pt has since had an appt in the office and did not mention that he did not have a wheelchair. I asked pt about this and he said that he did not think it was up to our office to obtain this for him as this was something that was set up at the hospital. I advised the pt that this was not a problem that we could write an order for him. Pt has an appt on Thursday and would like to pick up the order on the day of his appt and then leave our office and go to [facility]. He is pleased with this plan and will call with any questions.

## 2018-10-26 ENCOUNTER — Ambulatory Visit (INDEPENDENT_AMBULATORY_CARE_PROVIDER_SITE_OTHER): Payer: Medicare Other | Admitting: Physician Assistant

## 2018-10-26 ENCOUNTER — Encounter (INDEPENDENT_AMBULATORY_CARE_PROVIDER_SITE_OTHER): Payer: Self-pay | Admitting: Physician Assistant

## 2018-10-26 VITALS — Ht 76.0 in | Wt 250.0 lb

## 2018-10-26 DIAGNOSIS — E11610 Type 2 diabetes mellitus with diabetic neuropathic arthropathy: Secondary | ICD-10-CM

## 2018-10-26 DIAGNOSIS — Z89512 Acquired absence of left leg below knee: Secondary | ICD-10-CM

## 2018-10-27 ENCOUNTER — Telehealth (INDEPENDENT_AMBULATORY_CARE_PROVIDER_SITE_OTHER): Payer: Self-pay | Admitting: Physician Assistant

## 2018-10-27 ENCOUNTER — Encounter (INDEPENDENT_AMBULATORY_CARE_PROVIDER_SITE_OTHER): Payer: Self-pay | Admitting: Physician Assistant

## 2018-10-27 NOTE — Telephone Encounter (Signed)
Diara from [facility] called in reference to a referral that was received for this patient.  She is needing more information in order to get the patient scheduled.  She will be sending a fax to get more information.  Her CB#[phone].  Thank you.

## 2018-10-27 NOTE — Progress Notes (Signed)
Office Visit Note   Patient: Daniel Leon           Date of Birth: 1951/07/15           MRN: [id number] Visit Date: 10/26/2018              Requested by: Steele Sizer, MD [address] [place], [place] [address] PCP: Steele Sizer, MD  Chief Complaint  Patient presents with  . Left Leg - Routine Post Op    10/13/2018 left BKA      HPI: The patient is a 68 year old gentleman who is seen for postoperative follow-up following a left transtibial amputation on 10/13/2022 left Charcot foot with collapse despite attempts at internal fixation in the past.  He has severe diabetic insensate neuropathy.  He reports minimal pain over the area over the past week.  He has been utilizing a silver stump shrinker stocking and a limb protector as instructed.  He does request a prescription for a wheelchair with elevating leg rest today.  Assessment & Plan: Visit Diagnoses:  1. Acquired absence of left lower extremity below knee (HCC)   2. Diabetic Charct's arthropathy (HCC)     Plan: We will leave the staples in for an additional week.  Prescription was written for a wheelchair with elevating leg rests for the patient.  He should continue his stump shrinker stocking directly over the skin and wear this around the clock except for bathing.  He will follow-up in 1 week.  Follow-Up Instructions: Return in about 1 week (around 11/02/2018).   Ortho Exam  Patient is alert, oriented, no adenopathy, well-dressed, normal affect, normal respiratory effort. The left transtibial incision is healing well and staples are intact.  There is moderate edema but no erythema and scant drainage.  No signs of infection or cellulitis.  Imaging: No results found.   Labs: Lab Results  Component Value Date   HGBA1C 5.7 (H) 10/13/2018   HGBA1C 5.8 07/13/2018   HGBA1C 5.9 07/28/2017     Lab Results  Component Value Date   ALBUMIN 4.0 01/26/2018   ALBUMIN 4.4 12/29/2016   ALBUMIN 4.2 12/24/2015     Body mass index is 30.43 kg/m.  Orders:  No orders of the defined types were placed in this encounter.  No orders of the defined types were placed in this encounter.    Procedures: No procedures performed  Clinical Data: No additional findings.  ROS:  All other systems negative, except as noted in the HPI. Review of Systems  Objective: Vital Signs: Ht 6\' 4"  (1.93 m)   Wt 250 lb (113.4 kg)   BMI 30.43 kg/m   Specialty Comments:  No specialty comments available.  PMFS History: Patient Active Problem List   Diagnosis Date Noted  . S/P BKA (below knee amputation), left (HCC) 10/13/2018  . Charcot foot due to diabetes mellitus (HCC) 01/26/2018  . PE (physical exam), annual 12/29/2016  . Neuropathy 12/29/2016  . BPH (benign prostatic hyperplasia) 12/29/2016  . Advanced care planning/counseling discussion 12/29/2016  . DM type 2 with diabetic peripheral neuropathy (HCC) 06/30/2016  . Anemia 07/28/2015  . Essential hypertension    Past Medical History:  Diagnosis Date  . 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 Charcot ankle, left    collapse  . Charcot's joint of foot   . Diabetes mellitus without complication (HCC)    DIET-CONTROLLED  . Early cataracts, bilateral   . Essential hypertension   .  Right bundle branch block (RBBB)   . Traumatic arthropathy of ankle and foot   . Wears glasses     Family History  Problem Relation Age of Onset  . Heart disease Mother   . Hypertension Mother   . Heart disease Father   . Hypertension Father   . Hypertension Brother   . Prostate cancer Neg Hx   . Bladder Cancer Neg Hx   . Kidney cancer Neg Hx     Past Surgical History:  Procedure Laterality Date  . AMPUTATION Left 10/13/2018   Procedure: LEFT BELOW KNEE AMPUTATION;  Surgeon: [name], MD;  Location: [facility] OR;  Service: Orthopedics;  Laterality: Left;  . COLONOSCOPY    . ELBOW SURGERY Right 1966  . FOOT ARTHRODESIS, SUBTALAR Right 2019  .  TONSILLECTOMY    . TOTAL ANKLE REPLACEMENT    . ULNAR NERVE TRANSPOSITION Right 09/05/2015   Procedure: ULNAR NERVE DECOMPRESSION/TRANSPOSITION;  Surgeon: [name], MD;  Location: [facility] ORS;  Service: Orthopedics;  Laterality: Right;   Social History   Occupational History  . Not on file  Tobacco Use  . Smoking status: Former Smoker    Packs/day: 0.50    Years: 10.00    Pack years: 5.00    Types: Cigarettes    Last attempt to quit: 07/27/2000    Years since quitting: 18.2  . Smokeless tobacco: Never Used  Substance and Sexual Activity  . Alcohol use: Yes    Comment: very little  . Drug use: No  . Sexual activity: Not on file

## 2018-10-30 ENCOUNTER — Telehealth (INDEPENDENT_AMBULATORY_CARE_PROVIDER_SITE_OTHER): Payer: Self-pay

## 2018-10-30 ENCOUNTER — Other Ambulatory Visit: Payer: Self-pay | Admitting: Family Medicine

## 2018-10-30 DIAGNOSIS — I10 Essential (primary) hypertension: Secondary | ICD-10-CM

## 2018-10-30 NOTE — Telephone Encounter (Signed)
I tried to return call from [facility] but did not get an answer and left number to return call to our office to get more information pertaining to what the facility needs from [place] to do care with patient. Also, patient has been established recently with another [facility] agency and need to clarify their status on which company is providing care. I will try to call again and discuss this and get clarification.

## 2018-10-31 ENCOUNTER — Telehealth (INDEPENDENT_AMBULATORY_CARE_PROVIDER_SITE_OTHER): Payer: Self-pay | Admitting: Orthopedic Surgery

## 2018-10-31 NOTE — Telephone Encounter (Signed)
I called and sw pt he states that he had difficulty getting in touch with company. He advised that they are waiting on paperwork for a wheelchair from our office. I advised that we have not received anything as of yet and that we have been trying to call [facility] office due to the message below. Advised that it as not made clear what services they were wanting or what the paperwork was for. Advised that we have tried to call multiple times with no answer. I advised that I have a generic form for [facility] for a wheelchair and that I will have the PA complete this and fax with his demographic information today. Will fax to [phone] I advised that I would call the office again to make sure that I could talk with someone. I will remain on hold until someone answers the phone. I advised the pt that I will also call him back and let him know if I was able to reach some one. Pt voiced understanding and is satisfied with plan.

## 2018-10-31 NOTE — Telephone Encounter (Signed)
.  Patient was called and informed that I spoke to Daira from [facility] and he unde-rstood that they will call with- updates of his delivery. I also informed him that everything was faxed and sent to [phone].

## 2018-10-31 NOTE — Telephone Encounter (Signed)
Message Sent In Error   °

## 2018-10-31 NOTE — Telephone Encounter (Signed)
Pt called asking if we have received the fax yet about getting his wheelchair .  Pt is very upset with the fact [facility] is telling him one thing and we have yet to get anything from them. Pt would like a call regarding this situation.

## 2018-10-31 NOTE — Telephone Encounter (Signed)
Requested Prescriptions  Pending Prescriptions Disp Refills  . lisinopril (PRINIVIL,ZESTRIL) 10 MG tablet [Pharmacy Med Name: LISINOPRIL 10MG  TABLETS] 90 tablet 0    Sig: TAKE 1 TABLET BY MOUTH DAILY     Cardiovascular:  ACE Inhibitors Failed - 10/30/2018 11:22 PM      Failed - Cr in normal range and within 180 days    Creatinine, Ser  Date Value Ref Range Status  10/13/2018 1.39 (H) 0.61 - 1.24 mg/dL Final         Failed - Last BP in normal range    BP Readings from Last 1 Encounters:  10/17/18 (!) 144/82         Passed - K in normal range and within 180 days    Potassium  Date Value Ref Range Status  10/13/2018 4.8 3.5 - 5.1 mmol/L Final         Passed - Patient is not pregnant      Passed - Valid encounter within last 6 months    Recent Outpatient Visits          3 months ago Essential hypertension   [facility] Crissman, Redge Gainer, MD   8 months ago Essential hypertension   [facility] Crissman, Redge Gainer, MD   9 months ago Essential hypertension   [facility] Crissman, Redge Gainer, MD   1 year ago DM type 2 with diabetic peripheral neuropathy (HCC)   [facility] Crissman, Redge Gainer, MD   1 year ago Essential hypertension   [facility] Crissman, Redge Gainer, MD      Future Appointments            Tomorrow Adonis Huguenin, NP [facility]

## 2018-10-31 NOTE — Telephone Encounter (Signed)
Will hold message until I can contact or speak with [employer].

## 2018-10-31 NOTE — Telephone Encounter (Signed)
Tried to call [employer]; but no answer again. I haven't received fax either that she was suppose to send over. Will try to clarify this with patient. Hold time for [employer] lasted at least 10 min or more at [phone].

## 2018-11-01 ENCOUNTER — Ambulatory Visit (INDEPENDENT_AMBULATORY_CARE_PROVIDER_SITE_OTHER): Payer: Medicare Other | Admitting: Family

## 2018-11-01 ENCOUNTER — Other Ambulatory Visit: Payer: Self-pay

## 2018-11-01 ENCOUNTER — Encounter (INDEPENDENT_AMBULATORY_CARE_PROVIDER_SITE_OTHER): Payer: Self-pay | Admitting: Family

## 2018-11-01 VITALS — Ht 76.0 in | Wt 250.0 lb

## 2018-11-01 DIAGNOSIS — Z89512 Acquired absence of left leg below knee: Secondary | ICD-10-CM

## 2018-11-01 NOTE — Progress Notes (Signed)
Post-Op Visit Note   Patient: Daniel Leon           Date of Birth: 1951-07-01           MRN: [id number] Visit Date: 11/01/2018 PCP: Steele Sizer, MD  Chief Complaint:  Chief Complaint  Patient presents with  . Left Leg - Routine Post Op    10/13/2018 Left BKA    HPI:  HPI Patient is a 68 year old gentleman who is 3 weeks status post left below the knee amputation.  Staples were removed today.  Patient states he is feels well has no concerns.  Will be following with [employer] for a new shrinker.  Ortho Exam On examination the left below the knee amputation is well-healed there is no erythema no drainage no gaping no sign of infection consolidating well.  Visit Diagnoses:  1. S/P BKA (below knee amputation), left [facility])     Plan: Order for prosthesis set up provided today he will follow with [employer] for a 2 XL shrinker.  Follow-up in office with [place] in 2 months.  Discussed return precautions will call with any concerns.  Follow-Up Instructions: Return in about 2 months (around 01/01/2019).   Imaging: No results found.  Orders:  No orders of the defined types were placed in this encounter.  No orders of the defined types were placed in this encounter.    PMFS History: Patient Active Problem List   Diagnosis Date Noted  . S/P BKA (below knee amputation), left (HCC) 10/13/2018  . Charcot foot due to diabetes mellitus (HCC) 01/26/2018  . PE (physical exam), annual 12/29/2016  . Neuropathy 12/29/2016  . BPH (benign prostatic hyperplasia) 12/29/2016  . Advanced care planning/counseling discussion 12/29/2016  . DM type 2 with diabetic peripheral neuropathy (HCC) 06/30/2016  . Anemia 07/28/2015  . Essential hypertension    Past Medical History:  Diagnosis Date  . 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 Charcot ankle, left    collapse  . Charcot's joint of foot   . Diabetes mellitus without complication (HCC)    DIET-CONTROLLED  . Early  cataracts, bilateral   . Essential hypertension   . Right bundle branch block (RBBB)   . Traumatic arthropathy of ankle and foot   . Wears glasses     Family History  Problem Relation Age of Onset  . Heart disease Mother   . Hypertension Mother   . Heart disease Father   . Hypertension Father   . Hypertension Brother   . Prostate cancer Neg Hx   . Bladder Cancer Neg Hx   . Kidney cancer Neg Hx     Past Surgical History:  Procedure Laterality Date  . AMPUTATION Left 10/13/2018   Procedure: LEFT BELOW KNEE AMPUTATION;  Surgeon: [name], MD;  Location: [facility] OR;  Service: Orthopedics;  Laterality: Left;  . COLONOSCOPY    . ELBOW SURGERY Right 1966  . FOOT ARTHRODESIS, SUBTALAR Right 2019  . TONSILLECTOMY    . TOTAL ANKLE REPLACEMENT    . ULNAR NERVE TRANSPOSITION Right 09/05/2015   Procedure: ULNAR NERVE DECOMPRESSION/TRANSPOSITION;  Surgeon: [name], MD;  Location: [facility] ORS;  Service: Orthopedics;  Laterality: Right;   Social History   Occupational History  . Not on file  Tobacco Use  . Smoking status: Former Smoker    Packs/day: 0.50    Years: 10.00    Pack years: 5.00    Types: Cigarettes    Last attempt to quit:  07/27/2000    Years since quitting: 18.2  . Smokeless tobacco: Never Used  Substance and Sexual Activity  . Alcohol use: Yes    Comment: very little  . Drug use: No  . Sexual activity: Not on file

## 2018-11-02 ENCOUNTER — Ambulatory Visit (INDEPENDENT_AMBULATORY_CARE_PROVIDER_SITE_OTHER): Payer: Medicare Other | Admitting: Physician Assistant

## 2018-11-22 ENCOUNTER — Ambulatory Visit (INDEPENDENT_AMBULATORY_CARE_PROVIDER_SITE_OTHER): Payer: Medicare Other | Admitting: Orthopedic Surgery

## 2018-11-22 ENCOUNTER — Telehealth (INDEPENDENT_AMBULATORY_CARE_PROVIDER_SITE_OTHER): Payer: Self-pay

## 2018-11-22 ENCOUNTER — Encounter (INDEPENDENT_AMBULATORY_CARE_PROVIDER_SITE_OTHER): Payer: Self-pay | Admitting: Family

## 2018-11-22 ENCOUNTER — Other Ambulatory Visit: Payer: Self-pay

## 2018-11-22 VITALS — Ht 76.0 in | Wt 250.0 lb

## 2018-11-22 DIAGNOSIS — Z89512 Acquired absence of left leg below knee: Secondary | ICD-10-CM

## 2018-11-22 NOTE — Telephone Encounter (Signed)
Pt called and stated he has had some drainage, redness, and soreness and this started recently. Pt is a post-op BKA and was asked COVID-19 screening and answered no to all questions. Pt will be comng in at 12:15pm today to be seen by NP.

## 2018-11-22 NOTE — Progress Notes (Signed)
Office Visit Note   Patient: Daniel Leon           Date of Birth: August 05, 1951           MRN: [id number] Visit Date: 11/22/2018              Requested by: Steele Sizer, MD [address] [place], [place] [address] PCP: Steele Sizer, MD  Chief Complaint  Patient presents with  . Left Leg - Routine Post Op    10/13/18 Left BKA wound check       HPI: Patient is a 68 year old gentleman who is 6 weeks status post left transtibial amputation.  Patient states he has a little bit of a 6 small tender area of bloody drainage over the lateral side of the incision.    Patient states that it 5 weeks out from surgery he was casted for prosthetic fitting.  Assessment & Plan: Visit Diagnoses:  1. S/P BKA (below knee amputation), left (HCC)     Plan: Patient will proceed with prosthetic fitting we will reevaluate in 2 weeks.  Follow-Up Instructions: Return in about 2 weeks (around 12/06/2018).   Ortho Exam  Patient is alert, oriented, no adenopathy, well-dressed, normal affect, normal respiratory effort.Examination in the limb is well consolidated he has been wearing the compression sleeve there is a small area of granulation tissue laterally.  This is an area about 3 mm in diameter.  This was debrided and this did appear to have a sterile stitch abscess.  Absorbable Vicryl suture was removed with pickups.  This was then covered with Iodosorb dressing with 4 x 4 Ace wrap and his stump shrinker was reapplied.   Imaging: No results found. No images are attached to the encounter.  Labs: Lab Results  Component Value Date   HGBA1C 5.7 (H) 10/13/2018   HGBA1C 5.8 07/13/2018   HGBA1C 5.9 07/28/2017     Lab Results  Component Value Date   ALBUMIN 4.0 01/26/2018   ALBUMIN 4.4 12/29/2016   ALBUMIN 4.2 12/24/2015    Body mass index is 30.43 kg/m.  Orders:  No orders of the defined types were placed in this encounter.  No orders of the defined types were placed in this  encounter.    Procedures: No procedures performed  Clinical Data: No additional findings.  ROS:  All other systems negative, except as noted in the HPI. Review of Systems  Objective: Vital Signs: Ht 6\' 4"  (1.93 m)   Wt 250 lb (113.4 kg)   BMI 30.43 kg/m   Specialty Comments:  No specialty comments available.  PMFS History: Patient Active Problem List   Diagnosis Date Noted  . S/P BKA (below knee amputation), left (HCC) 10/13/2018  . Charcot foot due to diabetes mellitus (HCC) 01/26/2018  . PE (physical exam), annual 12/29/2016  . Neuropathy 12/29/2016  . BPH (benign prostatic hyperplasia) 12/29/2016  . Advanced care planning/counseling discussion 12/29/2016  . DM type 2 with diabetic peripheral neuropathy (HCC) 06/30/2016  . Anemia 07/28/2015  . Essential hypertension    Past Medical History:  Diagnosis Date  . 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 Charcot ankle, left    collapse  . Charcot's joint of foot   . Diabetes mellitus without complication (HCC)    DIET-CONTROLLED  . Early cataracts, bilateral   . Essential hypertension   . Right bundle branch block (RBBB)   . Traumatic arthropathy of ankle and foot   . Wears  glasses     Family History  Problem Relation Age of Onset  . Heart disease Mother   . Hypertension Mother   . Heart disease Father   . Hypertension Father   . Hypertension Brother   . Prostate cancer Neg Hx   . Bladder Cancer Neg Hx   . Kidney cancer Neg Hx     Past Surgical History:  Procedure Laterality Date  . AMPUTATION Left 10/13/2018   Procedure: LEFT BELOW KNEE AMPUTATION;  Surgeon: [name], MD;  Location: [facility] OR;  Service: Orthopedics;  Laterality: Left;  . COLONOSCOPY    . ELBOW SURGERY Right 1966  . FOOT ARTHRODESIS, SUBTALAR Right 2019  . TONSILLECTOMY    . TOTAL ANKLE REPLACEMENT    . ULNAR NERVE TRANSPOSITION Right 09/05/2015   Procedure: ULNAR NERVE DECOMPRESSION/TRANSPOSITION;  Surgeon:  [name], MD;  Location: [facility] ORS;  Service: Orthopedics;  Laterality: Right;   Social History   Occupational History  . Not on file  Tobacco Use  . Smoking status: Former Smoker    Packs/day: 0.50    Years: 10.00    Pack years: 5.00    Types: Cigarettes    Last attempt to quit: 07/27/2000    Years since quitting: 18.3  . Smokeless tobacco: Never Used  Substance and Sexual Activity  . Alcohol use: Yes    Comment: very little  . Drug use: No  . Sexual activity: Not on file

## 2018-12-06 ENCOUNTER — Other Ambulatory Visit: Payer: Self-pay

## 2018-12-06 ENCOUNTER — Ambulatory Visit (INDEPENDENT_AMBULATORY_CARE_PROVIDER_SITE_OTHER): Payer: Medicare Other | Admitting: Family

## 2018-12-06 ENCOUNTER — Encounter (INDEPENDENT_AMBULATORY_CARE_PROVIDER_SITE_OTHER): Payer: Self-pay | Admitting: Family

## 2018-12-06 VITALS — Ht 76.0 in | Wt 250.0 lb

## 2018-12-06 DIAGNOSIS — Z89512 Acquired absence of left leg below knee: Secondary | ICD-10-CM

## 2018-12-06 NOTE — Progress Notes (Signed)
Office Visit Note   Patient: Daniel Leon           Date of Birth: Mar 22, 1951           MRN: [id number] Visit Date: 12/06/2018              Requested by: Steele Sizerrissman, Mark A, MD [address] [place], [place] [id number] PCP: Steele Sizerrissman, Mark A, MD  Chief Complaint  Patient presents with  . Left Leg - Routine Post Op    10/13/2018 left BKA      HPI: Patient is a 68 year old gentleman who is 2 months status post left transtibial amputation.   Continues to have a small area laterally that had been slowly healing, is about 50% improved since last visit per patient. States had 2 areas of abscess with suture material removed and cleansed at last visit. States one healed and another has not.  Assessment & Plan: Visit Diagnoses:  1. S/P BKA (below knee amputation), left (HCC)     Plan: Patient will proceed with prosthetic fitting we will reevaluate in 2 weeks. Continue with daily dial soap cleansing and dry dressings.   Follow-Up Instructions: Return in about 15 days (around 12/21/2018), or if symptoms worsen or fail to improve.   Ortho Exam  Patient is alert, oriented, no adenopathy, well-dressed, normal affect, normal respiratory effort. Examination in the limb is well consolidated he has been wearing the compression sleeve there is a small area of granulation tissue laterally.  This is an area about 5 mm in diameter.  Explored the wound, unable to find any absorbable suture material. No surrounding erythema or drainage. No sign of infection.  This was then covered with Iodosorb dressing with 4 x 4 Ace wrap and his stump shrinker was reapplied.   Imaging: No results found. No images are attached to the encounter.  Labs: Lab Results  Component Value Date   HGBA1C 5.7 (H) 10/13/2018   HGBA1C 5.8 07/13/2018   HGBA1C 5.9 07/28/2017     Lab Results  Component Value Date   ALBUMIN 4.0 01/26/2018   ALBUMIN 4.4 12/29/2016   ALBUMIN 4.2 12/24/2015    Body mass index is 30.43  kg/m.  Orders:  No orders of the defined types were placed in this encounter.  No orders of the defined types were placed in this encounter.    Procedures: No procedures performed  Clinical Data: No additional findings.  ROS:  All other systems negative, except as noted in the HPI. Review of Systems  Constitutional: Negative for chills and fever.  Skin: Positive for wound.    Objective: Vital Signs: Ht 6\' 4"  (1.93 m)   Wt 250 lb (113.4 kg)   BMI 30.43 kg/m   Specialty Comments:  No specialty comments available.  PMFS History: Patient Active Problem List   Diagnosis Date Noted  . S/P BKA (below knee amputation), left (HCC) 10/13/2018  . Charcot foot due to diabetes mellitus (HCC) 01/26/2018  . PE (physical exam), annual 12/29/2016  . Neuropathy 12/29/2016  . BPH (benign prostatic hyperplasia) 12/29/2016  . Advanced care planning/counseling discussion 12/29/2016  . DM type 2 with diabetic peripheral neuropathy (HCC) 06/30/2016  . Anemia 07/28/2015  . Essential hypertension    Past Medical History:  Diagnosis Date  . 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 Charcot ankle, left    collapse  . Charcot's joint of foot   . Diabetes mellitus without complication (HCC)    DIET-CONTROLLED  .  Early cataracts, bilateral   . Essential hypertension   . Right bundle branch block (RBBB)   . Traumatic arthropathy of ankle and foot   . Wears glasses     Family History  Problem Relation Age of Onset  . Heart disease Mother   . Hypertension Mother   . Heart disease Father   . Hypertension Father   . Hypertension Brother   . Prostate cancer Neg Hx   . Bladder Cancer Neg Hx   . Kidney cancer Neg Hx     Past Surgical History:  Procedure Laterality Date  . AMPUTATION Left 10/13/2018   Procedure: LEFT BELOW KNEE AMPUTATION;  Surgeon: [name], MD;  Location: [facility] OR;  Service: Orthopedics;  Laterality: Left;  . COLONOSCOPY    . ELBOW SURGERY Right  1966  . FOOT ARTHRODESIS, SUBTALAR Right 2019  . TONSILLECTOMY    . TOTAL ANKLE REPLACEMENT    . ULNAR NERVE TRANSPOSITION Right 09/05/2015   Procedure: ULNAR NERVE DECOMPRESSION/TRANSPOSITION;  Surgeon: [name], MD;  Location: [facility] ORS;  Service: Orthopedics;  Laterality: Right;   Social History   Occupational History  . Not on file  Tobacco Use  . Smoking status: Former Smoker    Packs/day: 0.50    Years: 10.00    Pack years: 5.00    Types: Cigarettes    Last attempt to quit: 07/27/2000    Years since quitting: 18.3  . Smokeless tobacco: Never Used  Substance and Sexual Activity  . Alcohol use: Yes    Comment: very little  . Drug use: No  . Sexual activity: Not on file

## 2018-12-20 ENCOUNTER — Encounter (INDEPENDENT_AMBULATORY_CARE_PROVIDER_SITE_OTHER): Payer: Self-pay | Admitting: Family

## 2018-12-20 ENCOUNTER — Ambulatory Visit (INDEPENDENT_AMBULATORY_CARE_PROVIDER_SITE_OTHER): Payer: Medicare Other | Admitting: Family

## 2018-12-20 ENCOUNTER — Other Ambulatory Visit: Payer: Self-pay

## 2018-12-20 VITALS — Ht 76.0 in | Wt 250.0 lb

## 2018-12-20 DIAGNOSIS — Z89512 Acquired absence of left leg below knee: Secondary | ICD-10-CM | POA: Diagnosis not present

## 2018-12-20 MED ORDER — GABAPENTIN 400 MG PO CAPS: 400.0000 mg | ORAL_CAPSULE | Freq: Three times a day (TID) | ORAL | 0 refills | Status: DC

## 2018-12-20 NOTE — Progress Notes (Signed)
Office Visit Note   Patient: Daniel Leon           Date of Birth: Jan 02, 1951           MRN: [id number] Visit Date: 12/20/2018              Requested by: Steele Sizerrissman, Mark A, MD [address] [place], [place] [id number] PCP: Steele Sizerrissman, Mark A, MD  Chief Complaint  Patient presents with  . Left Leg - Routine Post Op    10/13/18 left BKA       HPI: Patient is a 68 year old gentleman who is 2 months status post left transtibial amputation.   Continues to have a small area medially that had been slowly healing. Is getting his prosthetic this week.  States his gabapentin has run out he has been having to taper his dose and does need a refill of this today.  States it works well for his phantom pain.  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  He has the motivation and strength to be as successful K3 level walker.  He does not have any crutches.  States he would like to begin trying to work on his upper body strength.  Work towards getting out of the wheelchair in his home.  Requesting crutches for his mobility. Assessment & Plan: Visit Diagnoses:  1. S/P BKA (below knee amputation), left (HCC)     Plan: Continue with daily dial soap cleansing and dressings to remaining medial area.  This was explored today with pickups no suture material found.  Proceed with prosthesis fabrication and gait training.  Have ordered physical therapy.  He will follow-up in the office in 6 weeks.  Follow-Up Instructions: No follow-ups on file.   Ortho Exam  Patient is alert, oriented, no adenopathy, well-dressed, normal affect, normal respiratory effort. Examination in the limb is well consolidated he has been wearing the compression sleeve there is a small area of fibrinous tissue medially.  This is about 8 mm in diameter.  Following  debridement there is some depth about 4 mm this was explored with pickups. Unable to find any absorbable suture material.  Scant bloody drainage.  No surrounding erythema or drainage. No sign of infection.  This was then covered with Iodosorb dressing  and his stump shrinker was reapplied.   Imaging: No results found. No images are attached to the encounter.  Labs: Lab Results  Component Value Date   HGBA1C 5.7 (H) 10/13/2018   HGBA1C 5.8 07/13/2018   HGBA1C 5.9 07/28/2017     Lab Results  Component Value Date   ALBUMIN 4.0 01/26/2018   ALBUMIN 4.4 12/29/2016   ALBUMIN 4.2 12/24/2015    Body mass index is 30.43 kg/m.  Orders:  Orders Placed This Encounter  Procedures  . Ambulatory referral to Physical Therapy   Meds ordered this encounter  Medications  . gabapentin (NEURONTIN) 400 MG capsule    Sig: Take 1 capsule (400 mg total) by mouth 3 (three) times daily. Take 400 mg twice daily, may take a 3rd 400 mg dose as needed for pain    Dispense:  90 capsule    Refill:  0     Procedures: No procedures performed  Clinical Data: No additional findings.  ROS:  All other systems negative, except as noted in  the HPI. Review of Systems  Constitutional: Negative for chills and fever.  Skin: Positive for wound.    Objective: Vital Signs: Ht 6\' 4"  (1.93 m)   Wt 250 lb (113.4 kg)   BMI 30.43 kg/m   Specialty Comments:  No specialty comments available.  PMFS History: Patient Active Problem List   Diagnosis Date Noted  . S/P BKA (below knee amputation), left (HCC) 10/13/2018  . Charcot foot due to diabetes mellitus (HCC) 01/26/2018  . PE (physical exam), annual 12/29/2016  . Neuropathy 12/29/2016  . BPH (benign prostatic hyperplasia) 12/29/2016  . Advanced care planning/counseling discussion 12/29/2016  . DM type 2 with diabetic peripheral neuropathy (HCC) 06/30/2016  . Anemia 07/28/2015  . Essential hypertension    Past Medical History:  Diagnosis Date   . 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 Charcot ankle, left    collapse  . Charcot's joint of foot   . Diabetes mellitus without complication (HCC)    DIET-CONTROLLED  . Early cataracts, bilateral   . Essential hypertension   . Right bundle branch block (RBBB)   . Traumatic arthropathy of ankle and foot   . Wears glasses     Family History  Problem Relation Age of Onset  . Heart disease Mother   . Hypertension Mother   . Heart disease Father   . Hypertension Father   . Hypertension Brother   . Prostate cancer Neg Hx   . Bladder Cancer Neg Hx   . Kidney cancer Neg Hx     Past Surgical History:  Procedure Laterality Date  . AMPUTATION Left 10/13/2018   Procedure: LEFT BELOW KNEE AMPUTATION;  Surgeon: [name], MD;  Location: [facility] OR;  Service: Orthopedics;  Laterality: Left;  . COLONOSCOPY    . ELBOW SURGERY Right 1966  . FOOT ARTHRODESIS, SUBTALAR Right 2019  . TONSILLECTOMY    . TOTAL ANKLE REPLACEMENT    . ULNAR NERVE TRANSPOSITION Right 09/05/2015   Procedure: ULNAR NERVE DECOMPRESSION/TRANSPOSITION;  Surgeon: [name], MD;  Location: [facility] ORS;  Service: Orthopedics;  Laterality: Right;   Social History   Occupational History  . Not on file  Tobacco Use  . Smoking status: Former Smoker    Packs/day: 0.50    Years: 10.00    Pack years: 5.00    Types: Cigarettes    Last attempt to quit: 07/27/2000    Years since quitting: 18.4  . Smokeless tobacco: Never Used  Substance and Sexual Activity  . Alcohol use: Yes    Comment: very little  . Drug use: No  . Sexual activity: Not on file

## 2019-01-02 ENCOUNTER — Ambulatory Visit: Payer: Self-pay | Admitting: Orthopedic Surgery

## 2019-01-02 ENCOUNTER — Ambulatory Visit (INDEPENDENT_AMBULATORY_CARE_PROVIDER_SITE_OTHER): Payer: Medicare Other | Admitting: Orthopedic Surgery

## 2019-01-04 ENCOUNTER — Other Ambulatory Visit: Payer: Self-pay

## 2019-01-04 ENCOUNTER — Encounter: Payer: Self-pay | Admitting: Physical Therapy

## 2019-01-04 ENCOUNTER — Ambulatory Visit: Payer: Medicare Other | Attending: Family | Admitting: Physical Therapy

## 2019-01-04 DIAGNOSIS — R2689 Other abnormalities of gait and mobility: Secondary | ICD-10-CM

## 2019-01-04 DIAGNOSIS — R2681 Unsteadiness on feet: Secondary | ICD-10-CM | POA: Insufficient documentation

## 2019-01-04 DIAGNOSIS — R293 Abnormal posture: Secondary | ICD-10-CM

## 2019-01-04 DIAGNOSIS — M6281 Muscle weakness (generalized): Secondary | ICD-10-CM | POA: Insufficient documentation

## 2019-01-05 NOTE — Therapy (Signed)
[facility] [address] [place], [place], [address] Phone: [phone]   Fax:  [phone]  Physical Therapy Evaluation  Patient Details  Name: Daniel Leon MRN: [id number] Date of Birth: May 03, 1951 Referring Provider (PT): [name], NP   Encounter Date: 01/04/2019  PT End of Session - 01/04/19 1633    Visit Number  1    Number of Visits  25    Date for PT Re-Evaluation  04/04/19    Authorization Type  [employer]HC Medicare & Medicaid    PT Start Time  1400    PT Stop Time  1440    PT Time Calculation (min)  40 min    Equipment Utilized During Treatment  Gait belt    Activity Tolerance  Patient tolerated treatment well    Behavior During Therapy  [facility] for tasks assessed/performed       Past Medical History:  Diagnosis Date  . 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 Charcot ankle, left    collapse  . Charcot's joint of foot   . Diabetes mellitus without complication (HCC)    DIET-CONTROLLED  . Early cataracts, bilateral   . Essential hypertension   . Right bundle branch block (RBBB)   . Traumatic arthropathy of ankle and foot   . Wears glasses     Past Surgical History:  Procedure Laterality Date  . AMPUTATION Left 10/13/2018   Procedure: LEFT BELOW KNEE AMPUTATION;  Surgeon: [name], MD;  Location: [facility] OR;  Service: Orthopedics;  Laterality: Left;  . COLONOSCOPY    . ELBOW SURGERY Right 1966  . FOOT ARTHRODESIS, SUBTALAR Right 2019  . TONSILLECTOMY    . TOTAL ANKLE REPLACEMENT    . ULNAR NERVE TRANSPOSITION Right 09/05/2015   Procedure: ULNAR NERVE DECOMPRESSION/TRANSPOSITION;  Surgeon: [name], MD;  Location: [facility] ORS;  Service: Orthopedics;  Laterality: Right;    There were no vitals filed for this visit.   Subjective Assessment - 01/04/19 1408    Subjective  This 68yo male was referred for PT with BKA by [name], NP on 12/20/2018.  He underwent a left  Transtibial Amputation on 10/13/2018 due to severe Charcot arthropathy. He had first prosthesis delivered 12/26/2018.     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  Yes    Pain Score  2    in last week, worst 6/10, best 2/10   Pain Location  Leg   residual limb   Pain Orientation  Left   distal tibia   Pain Descriptors / Indicators  Sore    Pain Type  Acute pain;Phantom pain   phantom more at night   Pain Onset  1 to 4 weeks ago    Pain Frequency  Intermittent    Aggravating Factors   wearing prosthesis    Pain Relieving Factors  taking prosthesis off         [facility] PT Assessment - 01/04/19 1400      Assessment   Medical Diagnosis  Left Transtibial Amputation    Referring Provider (PT)  [name], NP    Onset Date/Surgical Date  12/26/18   prosthesis delivery   Hand Dominance  Right    Prior Therapy  HHPT ~5 times  Precautions   Precautions  Fall      Balance Screen   Has the patient fallen in the past 6 months  No    Has the patient had a decrease in activity level because of a fear of falling?   Yes    Is the patient reluctant to leave their home because of a fear of falling?   Yes      Home Environment   Living Environment  Private residence    Living Arrangements  Spouse/significant other    Type of Home  House    Home Access  Stairs to enter    Entrance Stairs-Number of Steps  2    Entrance Stairs-Rails  Right    Home Layout  One level    Home Equipment  Crutches;Walker - 2 wheels;Cane - single point;Tub bench;Wheelchair - manual      Prior Function   Level of Independence  Independent;Independent with household mobility without device;Independent with community mobility without device    Vocation  Full time employment    Vocation Requirements  walk on uneven  terrain, Holiday representative sites    Leisure  hiking      Posture/Postural Control   Posture/Postural Control  Postural limitations    Postural Limitations  Rounded Shoulders;Forward head;Weight shift right      ROM / Strength   AROM / PROM / Strength  AROM;Strength      AROM   Overall AROM   Within functional limits for tasks performed;Deficits    Overall AROM Comments  right elbow extension ~ -30* from old injury      Strength   Overall Strength  Within functional limits for tasks performed      Transfers   Transfers  Sit to Stand;Stand to Sit    Sit to Stand  5: Supervision;With upper extremity assist;From chair/3-in-1    Stand to Sit  5: Supervision;With upper extremity assist;To chair/3-in-1;With armrests      Ambulation/Gait   Ambulation/Gait  Yes    Ambulation/Gait Assistance  5: Supervision    Ambulation/Gait Assistance Details  excessive UE weight bearing on crutches, partial weight on prosthesis;  PT adjusted right crutch handle due to elbow contracture. Pt reported felt better.     Ambulation Distance (Feet)  150 Feet    Assistive device  Prosthesis;Crutches;R Axillary Crutch;L Axillary Crutch    Gait Pattern  Step-through pattern;Decreased step length - right;Decreased stance time - left;Decreased hip/knee flexion - left;Decreased weight shift to left;Left circumduction;Left hip hike;Left foot flat;Left flexed knee in stance;Antalgic;Lateral hip instability;Trunk flexed;Abducted - left    Ambulation Surface  Indoor;Level    Gait velocity  1.55 ft/sec    Stairs  Yes    Stairs Assistance  5: Supervision    Stairs Assistance Details (indicate cue type and reason)  PT instructed in step-to pattern with TTA prosthesis    Stair Management Technique  Two rails;One rail Left;With crutches;Step to pattern;Forwards   2 rails 1st rep, lt rail/rt crutch 2nd rep, 2 crutches 3rd    Number of Stairs  4   3 reps     Standardized Balance Assessment   Standardized Balance Assessment  Berg  Balance Test      Berg Balance Test   Sit to Stand  Able to stand  independently using hands    Standing Unsupported  Able to stand 2 minutes with supervision    Sitting with Back Unsupported but Feet Supported on Floor or Stool  Able to sit safely and  securely 2 minutes    Stand to Sit  Controls descent by using hands    Transfers  Able to transfer safely, minor use of hands    Standing Unsupported with Eyes Closed  Able to stand 10 seconds with supervision    Standing Unsupported with Feet Together  Able to place feet together independently and stand for 1 minute with supervision    From Standing, Reach Forward with Outstretched Arm  Can reach forward >5 cm safely (2")    From Standing Position, Pick up Object from Floor  Able to pick up shoe, needs supervision    From Standing Position, Turn to Look Behind Over each Shoulder  Looks behind one side only/other side shows less weight shift    Turn 360 Degrees  Needs assistance while turning    Standing Unsupported, Alternately Place Feet on Step/Stool  Needs assistance to keep from falling or unable to try    Standing Unsupported, One Foot in Front  Needs help to step but can hold 15 seconds    Standing on One Leg  Tries to lift leg/unable to hold 3 seconds but remains standing independently    Total Score  33      Prosthetics Assessment - 01/04/19 1400      Prosthetics   Prosthetic Care Dependent with  Skin check;Residual limb care;Care of non-amputated limb;Prosthetic cleaning;Ply sock cleaning;Correct ply sock adjustment;Proper wear schedule/adjustment;Proper weight-bearing schedule/adjustment    Donning prosthesis   Supervision    Doffing prosthesis   Supervision    Current prosthetic wear tolerance (days/week)   8 of 9 days since prosthesis delivery    Current prosthetic wear tolerance (#hours/day)   30 min 3x/day    Current prosthetic weight-bearing tolerance (hours/day)   Patient tolerated 10 minutes of standing & gait with  crutches with partial weight on prosthesis with distal tibia pain 5/10    Edema  pitting edema with 5 sec refill    Residual limb condition   medial incision with scab, no signs of infection, redness over patella (friction from liner) and distal tibia from socket pressure with too few socks, good hair growth,     K code/activity level with prosthetic use   K3 full community with variable cadence,  silicon liner with shuttle pin lock, total contact socket, dynamic response foot               Objective measurements completed on examination: See above findings.      [facility] Adult PT Treatment/Exercise - 01/04/19 1400      Prosthetics   Prosthetic Care Comments   wear 2 hrs 2x/day with increase q5 days if no issues.     Education Provided  Skin check;Residual limb care;Prosthetic cleaning;Correct ply sock adjustment;Proper Donning;Proper wear schedule/adjustment    Person(s) Educated  Patient    Education Method  Explanation;Demonstration;Tactile cues;Verbal cues    Education Method  Verbalized understanding;Returned demonstration;Tactile cues required;Verbal cues required;Needs further instruction               PT Short Term Goals - 01/04/19 1713      PT SHORT TERM GOAL #1   Title  Patient demonstrates proper donning of prosthesis and verbalizes proper cleaning.  (All STGs Target Date: 02/02/2019)    Time  1    Period  Months    Status  New    Target Date  02/02/19      PT SHORT TERM GOAL #2   Title  Patient tolerates prosthesis wear >  8hrs total per day without wound increasing size & no new wounds.     Time  1    Period  Months    Status  New    Target Date  04/04/19      PT SHORT TERM GOAL #3   Title  Berg Balance >/= 38/56    Time  1    Period  Months    Status  New    Target Date  04/04/19      PT SHORT TERM GOAL #4   Title  Patient ambulates 200' with cane & prosthesis with supervision.     Time  1    Period  Months    Status  New    Target Date   04/04/19      PT SHORT TERM GOAL #5   Title  Patient negotiates stairs with single rail, ramps & curbs with cane & prosthesis with minimal assist.     Time  1    Period  Months    Status  New    Target Date  04/04/19        PT Long Term Goals - 01/05/19 1707      PT LONG TERM GOAL #1   Title  Patient verbalizes & demonstrates proper prosthetic care to enable safe use of prosthesis. (All LTGs Target Date: 04/04/2019)    Time  3    Period  Months    Status  New    Target Date  04/04/19      PT LONG TERM GOAL #2   Title  Patient tolerates prosthesis wear >90% of awake hours without skin issues or limb pain to enable function throughout his day.      Time  3    Period  Months    Status  New    Target Date  04/04/19      PT LONG TERM GOAL #3   Title  Berg Balance >45/56 to indicate lower fall risk.     Time  3    Period  Months    Status  New    Target Date  04/04/19      PT LONG TERM GOAL #4   Title  Patient ambulates 500' outdoors including grass with LRAD & Prosthesis modified independent.     Time  3    Period  Months    Status  New    Target Date  04/04/19      PT LONG TERM GOAL #5   Title  Patient negotiates stairs, ramps & curbs with LRAD & prosthesis modified independent to enable community access.     Time  3    Period  Months    Status  New    Target Date  04/04/19             Plan - 01/04/19 1659    Clinical Impression Statement  This 68yo male underwent a left Transtibial Amputation on 10/13/2018 and he recieved a prosthesis on 12/26/2018. He is dependent in proper prosthetic care which increases risk of skin issues or pain issues. Patient has limited wear at 3x/day for 8 of 9 days since delivery which limits function during his day.  Berg Balance score or 33/56 indicates high fall risk and dependency in standing ADLs.  His prosthetic gait is dependent on crutches with partial weight on prosthesis, significant gait deviations & gait velocity of  1.55 ft/sec all indicating high fall risk.  Patient would benefit from skilled  PT intervention to improve functional mobility and safety.     Personal Factors and Comorbidities  Age;Comorbidity 3+;Past/Current Experience;Time since onset of injury/illness/exacerbation    Comorbidities  L TTA, DM2, neuropathy, HTN, RBBB,     Examination-Activity Limitations  Carry;Lift;Locomotion Level;Squat;Stairs;Stand;Transfers    Examination-Participation Restrictions  Community Activity;Driving;Yard Work    Conservation officer, historic buildings  Evolving/Moderate complexity    Clinical Decision Making  Moderate    Rehab Potential  Good    PT Frequency  2x / week    PT Duration  12 weeks    PT Treatment/Interventions  ADLs/Self Care Home Management;DME Instruction;Gait training;Stair training;Functional mobility training;Therapeutic activities;Therapeutic exercise;Balance training;Neuromuscular re-education;Patient/family education;Prosthetic Training    PT Next Visit Plan  review prosthetic care, instruct in HEP at sink, prosthetic gait with crutches try 4-pt pattern & introduce ramps / curbs    Consulted and Agree with Plan of Care  Patient       Patient will benefit from skilled therapeutic intervention in order to improve the following deficits and impairments:  Abnormal gait, Decreased activity tolerance, Decreased balance, Decreased endurance, Decreased knowledge of use of DME, Decreased mobility, Decreased skin integrity, Decreased strength, Postural dysfunction, Prosthetic Dependency, Pain  Visit Diagnosis: Abnormal posture  Other abnormalities of gait and mobility  Unsteadiness on feet  Muscle weakness (generalized)     Problem List Patient Active Problem List   Diagnosis Date Noted  . S/P BKA (below knee amputation), left (HCC) 10/13/2018  . Charcot foot due to diabetes mellitus (HCC) 01/26/2018  . PE (physical exam), annual 12/29/2016  . Neuropathy 12/29/2016  . BPH (benign prostatic  hyperplasia) 12/29/2016  . Advanced care planning/counseling discussion 12/29/2016  . DM type 2 with diabetic peripheral neuropathy (HCC) 06/30/2016  . Anemia 07/28/2015  . Essential hypertension     Shauniece Kwan PT, DPT 01/05/2019, 5:18 PM  [facility] [address] [place], [place], [address] Phone: [phone]   Fax:  [phone]  Name: KENDON SEDENO MRN: [id number] Date of Birth: Jun 11, 1951

## 2019-01-09 ENCOUNTER — Ambulatory Visit: Payer: Medicare Other | Admitting: Physical Therapy

## 2019-01-09 ENCOUNTER — Other Ambulatory Visit: Payer: Self-pay

## 2019-01-09 ENCOUNTER — Encounter: Payer: Self-pay | Admitting: Physical Therapy

## 2019-01-09 DIAGNOSIS — R293 Abnormal posture: Secondary | ICD-10-CM | POA: Diagnosis not present

## 2019-01-09 DIAGNOSIS — R2689 Other abnormalities of gait and mobility: Secondary | ICD-10-CM

## 2019-01-09 DIAGNOSIS — M6281 Muscle weakness (generalized): Secondary | ICD-10-CM

## 2019-01-09 DIAGNOSIS — R2681 Unsteadiness on feet: Secondary | ICD-10-CM

## 2019-01-09 NOTE — Patient Instructions (Signed)
Do each exercise 1-2  times per day Do each exercise 5-10 repetitions Hold each exercise for 2 seconds to feel your location  AT SINK FIND YOUR MIDLINE POSITION AND PLACE FEET EQUAL DISTANCE FROM THE MIDLINE.  USE TAPE ON FLOOR TO MARK THE MIDLINE POSITION. You also should try to feel with your limb pressure in socket.  You are trying to feel with limb what you used to feel with the bottom of your foot.  1. Side to Side Shift: Moving your hips only (not shoulders): move weight onto your left leg, HOLD/FEEL.  Move back to equal weight on each leg, HOLD/FEEL. Move weight onto your right leg, HOLD/FEEL. Move back to equal weight on each leg, HOLD/FEEL. Repeat. Do initially with both hands, then with left hand only, progress to no hands.  2. Front to Back Shift: Moving your hips only (not shoulders): move your weight forward onto your toes, HOLD/FEEL. Move your weight back to equal Flat Foot on both legs, HOLD/FEEL. Move your weight back onto your heels, HOLD/FEEL. Move your weight back to equal on both legs, HOLD/FEEL. Repeat.  Do initially with both hands, then with left hand only, progress to no hands 3. Moving Cones / Cups: With equal weight on each leg: Hold on with one hand the first time, then progress to no hand supports. Move cups from one side of sink to the other. Place cups ~2" out of your reach, progress to 10" beyond reach. 4. Overhead/Upward Reaching: alternated reaching up to top cabinets or ceiling if no cabinets present. Keep equal weight on each leg. Start with one hand support on counter while other hand reaches and progress to no hand support with reaching. 5.   Looking Over Shoulders: With equal weight on each leg: alternate turning to look over your shoulders with one hand support on counter as needed. Shift weight to side looking, pull hip then shoulder then head/eyes around to look behind you. Start with one hand support & progress to no hand support. 6.   Stepping into lower  cabinet:  Shift weight with pelvis over prosthesis, slowly step right leg into cabinet. Do initially with both hands, then with left hand only, progress to no hands

## 2019-01-09 NOTE — Therapy (Signed)
[facility] [address] [place], [place], [address] Phone: [phone]   Fax:  [phone]  Physical Therapy Treatment  Patient Details  Name: Daniel Leon MRN: [id number] Date of Birth: [birth year] Referring Provider (PT): [name], NP   Encounter Date: 01/09/2019  PT End of Session - 01/09/19 1816    Visit Number  2    Number of Visits  25    Date for PT Re-Evaluation  04/04/19    Authorization Type  [employer]HC Medicare & Medicaid    PT Start Time  1145    PT Stop Time  1230    PT Time Calculation (min)  45 min    Equipment Utilized During Treatment  Gait belt    Activity Tolerance  Patient tolerated treatment well    Behavior During Therapy  WFL for tasks assessed/performed       Past Medical History:  Diagnosis Date  . 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 Charcot ankle, left    collapse  . Charcot's joint of foot   . Diabetes mellitus without complication (HCC)    DIET-CONTROLLED  . Early cataracts, bilateral   . Essential hypertension   . Right bundle branch block (RBBB)   . Traumatic arthropathy of ankle and foot   . Wears glasses     Past Surgical History:  Procedure Laterality Date  . AMPUTATION Left 10/13/2018   Procedure: LEFT BELOW KNEE AMPUTATION;  Surgeon: [name], MD;  Location: [facility] OR;  Service: Orthopedics;  Laterality: Left;  . COLONOSCOPY    . ELBOW SURGERY Right 1966  . FOOT ARTHRODESIS, SUBTALAR Right 2019  . TONSILLECTOMY    . TOTAL ANKLE REPLACEMENT    . ULNAR NERVE TRANSPOSITION Right 09/05/2015   Procedure: ULNAR NERVE DECOMPRESSION/TRANSPOSITION;  Surgeon: [name], MD;  Location: [facility] ORS;  Service: Orthopedics;  Laterality: Right;    There were no vitals filed for this visit.  Subjective Assessment - 01/09/19 1143    Subjective  He has been wearing prosthesis 2hrs 2x/day with only pain on one occasion. No falls.     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  No/denies    Pain Onset  1 to 4 weeks ago                       [facility] Adult PT Treatment/Exercise - 01/09/19 1145      Transfers   Transfers  Sit to Stand;Stand to Sit    Sit to Stand  5: Supervision;With upper extremity assist;With armrests;From chair/3-in-1   stabilizes without UE support but close for safety   Stand to Sit  5: Supervision;With upper extremity assist;With armrests;To chair/3-in-1   stabilizes without UE support but close for safety     Ambulation/Gait   Ambulation/Gait  Yes    Ambulation/Gait Assistance  5: Supervision;4: Min guard    Ambulation/Gait Assistance Details  verbal, demo & tactile cues on 4-pt sequence esp. timing & distance of left (prosthetic side) UE / crutch.  PT instructed in seated HEP to work on sequencing.      Ambulation Distance (Feet)  150 Feet    Assistive device  Prosthesis;Crutches;R Axillary Crutch;L Axillary Crutch    Ambulation  Surface  Indoor;Level    Ramp  5: Supervision   prosthesis & crutches   Ramp Details (indicate cue type and reason)  demo & verbal cues on proper technique with TTA prosthesis    Curb  5: Supervision   prosthesis & crutches   Curb Details (indicate cue type and reason)  demo & verbal cues on proper technique with TTA prosthesis      Prosthetics   Prosthetic Care Comments   use of antiperspirant to manage sweat  & adjusting ply socks    Current prosthetic wear tolerance (days/week)   daily    Current prosthetic wear tolerance (#hours/day)   2hrs 2x/day;  increase to 3hrs 2x/day    Residual limb condition   No open areas, normal color, temperature;  increased moisture from sweating    Education Provided  Skin check;Residual limb care;Prosthetic cleaning;Correct ply sock  adjustment;Proper Donning;Proper wear schedule/adjustment;Other (comment)   see prosthetic care comments   Person(s) Educated  Patient    Education Method  Explanation;Demonstration;Tactile cues;Verbal cues    Education Method  Verbalized understanding;Returned demonstration;Needs further instruction    Donning Prosthesis  Supervision             PT Education - 01/09/19 1220    Education Details  HEP at sink to work on proprioception, standing balance & tolerance and seated sequencing for 4-pt gait pattern    Person(s) Educated  Patient    Methods  Explanation;Demonstration;Tactile cues;Verbal cues;Handout    Comprehension  Verbalized understanding;Returned demonstration       PT Short Term Goals - 01/09/19 1816      PT SHORT TERM GOAL #1   Title  Patient demonstrates proper donning of prosthesis and verbalizes proper cleaning.  (All STGs Target Date: 02/02/2019)    Time  1    Period  Months    Status  On-going    Target Date  02/02/19      PT SHORT TERM GOAL #2   Title  Patient tolerates prosthesis wear >8hrs total per day without wound increasing size & no new wounds.     Time  1    Period  Months    Status  New    Target Date  04/04/19      PT SHORT TERM GOAL #3   Title  Berg Balance >/= 38/56    Time  1    Period  Months    Status  On-going    Target Date  04/04/19      PT SHORT TERM GOAL #4   Title  Patient ambulates 200' with cane & prosthesis with supervision.     Time  1    Period  Months    Status  On-going    Target Date  04/04/19      PT SHORT TERM GOAL #5   Title  Patient negotiates stairs with single rail, ramps & curbs with cane & prosthesis with minimal assist.     Time  1    Period  Months    Status  On-going    Target Date  04/04/19        PT Long Term Goals - 01/09/19 1816      PT LONG TERM GOAL #1   Title  Patient verbalizes & demonstrates proper prosthetic care to enable safe use of prosthesis. (All LTGs Target Date: 04/04/2019)     Time  3    Period  Months    Status  On-going  Target Date  04/04/19      PT LONG TERM GOAL #2   Title  Patient tolerates prosthesis wear >90% of awake hours without skin issues or limb pain to enable function throughout his day.      Time  3    Period  Months    Status  On-going    Target Date  04/04/19      PT LONG TERM GOAL #3   Title  Berg Balance >45/56 to indicate lower fall risk.     Time  3    Period  Months    Status  On-going    Target Date  04/04/19      PT LONG TERM GOAL #4   Title  Patient ambulates 500' outdoors including grass with LRAD & Prosthesis modified independent.     Time  3    Period  Months    Status  On-going    Target Date  04/04/19      PT LONG TERM GOAL #5   Title  Patient negotiates stairs, ramps & curbs with LRAD & prosthesis modified independent to enable community access.     Time  3    Period  Months    Status  On-going    Target Date  04/04/19            Plan - 01/09/19 1817    Clinical Impression Statement  Today's skilled session focused on further education of prosthetic care with increased wear, standing HEP at sink to work on balance/proprioception and prosthetic gait with crutches including ramps, curbs & 4-pt pattern.     Personal Factors and Comorbidities  Age;Comorbidity 3+;Past/Current Experience;Time since onset of injury/illness/exacerbation    Comorbidities  L TTA, DM2, neuropathy, HTN, RBBB,     Examination-Activity Limitations  Carry;Lift;Locomotion Level;Squat;Stairs;Stand;Transfers    Examination-Participation Restrictions  Community Activity;Driving;Yard Work    Conservation officer, historic buildings  Evolving/Moderate complexity    Rehab Potential  Good    PT Frequency  2x / week    PT Duration  12 weeks    PT Treatment/Interventions  ADLs/Self Care Home Management;DME Instruction;Gait training;Stair training;Functional mobility training;Therapeutic activities;Therapeutic exercise;Balance training;Neuromuscular  re-education;Patient/family education;Prosthetic Training    PT Next Visit Plan  work on 4-pt gait pattern, progress prosthetic care & wear, standing balance and picking up objects from floor    Consulted and Agree with Plan of Care  Patient       Patient will benefit from skilled therapeutic intervention in order to improve the following deficits and impairments:  Abnormal gait, Decreased activity tolerance, Decreased balance, Decreased endurance, Decreased knowledge of use of DME, Decreased mobility, Decreased skin integrity, Decreased strength, Postural dysfunction, Prosthetic Dependency, Pain  Visit Diagnosis: Abnormal posture  Other abnormalities of gait and mobility  Unsteadiness on feet  Muscle weakness (generalized)     Problem List Patient Active Problem List   Diagnosis Date Noted  . S/P BKA (below knee amputation), left (HCC) 10/13/2018  . Charcot foot due to diabetes mellitus (HCC) 01/26/2018  . PE (physical exam), annual 12/29/2016  . Neuropathy 12/29/2016  . BPH (benign prostatic hyperplasia) 12/29/2016  . Advanced care planning/counseling discussion 12/29/2016  . DM type 2 with diabetic peripheral neuropathy (HCC) 06/30/2016  . Anemia 07/28/2015  . Essential hypertension     Kari Kerth PT, DPT 01/09/2019, 6:20 PM  [facility] [address] [place], [place], [address] Phone: [phone]   Fax:  [phone]  Name: MELVILLE ENGEN MRN:  [id number] Date of Birth: 06-May-1951

## 2019-01-16 ENCOUNTER — Encounter: Payer: Self-pay | Admitting: Physical Therapy

## 2019-01-16 ENCOUNTER — Other Ambulatory Visit: Payer: Self-pay

## 2019-01-16 ENCOUNTER — Ambulatory Visit: Payer: Medicare Other | Admitting: Physical Therapy

## 2019-01-16 DIAGNOSIS — R2681 Unsteadiness on feet: Secondary | ICD-10-CM

## 2019-01-16 DIAGNOSIS — R2689 Other abnormalities of gait and mobility: Secondary | ICD-10-CM

## 2019-01-16 DIAGNOSIS — R293 Abnormal posture: Secondary | ICD-10-CM

## 2019-01-16 DIAGNOSIS — M6281 Muscle weakness (generalized): Secondary | ICD-10-CM

## 2019-01-16 NOTE — Therapy (Signed)
[facility] [address] [place], [place], [address] Phone: [phone]   Fax:  [phone]  Physical Therapy Treatment  Patient Details  Name: Daniel Leon MRN: [id number] Date of Birth: 10-Jun-1951 Referring Provider (PT): Barnie DelErin Zamora, NP   Encounter Date: 01/16/2019  PT End of Session - 01/16/19 1247    Visit Number  3    Number of Visits  25    Date for PT Re-Evaluation  04/04/19    Authorization Type  UHC Medicare & Medicaid    PT Start Time  1148    PT Stop Time  1230    PT Time Calculation (min)  42 min    Equipment Utilized During Treatment  Gait belt    Activity Tolerance  Patient tolerated treatment well    Behavior During Therapy  WFL for tasks assessed/performed       Past Medical History:  Diagnosis Date  . 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 Charcot ankle, left    collapse  . Charcot's joint of foot   . Diabetes mellitus without complication (HCC)    DIET-CONTROLLED  . Early cataracts, bilateral   . Essential hypertension   . Right bundle branch block (RBBB)   . Traumatic arthropathy of ankle and foot   . Wears glasses     Past Surgical History:  Procedure Laterality Date  . AMPUTATION Left 10/13/2018   Procedure: LEFT BELOW KNEE AMPUTATION;  Surgeon: Nadara Mustarduda, Marcus V, MD;  [facility];  Service: Orthopedics;  Laterality: Left;  . COLONOSCOPY    . ELBOW SURGERY Right 1966  . FOOT ARTHRODESIS, SUBTALAR Right 2019  . TONSILLECTOMY    . TOTAL ANKLE REPLACEMENT    . ULNAR NERVE TRANSPOSITION Right 09/05/2015   Procedure: ULNAR NERVE DECOMPRESSION/TRANSPOSITION;  Surgeon: Myra Rudehristopher Smith, MD;  Location: [facility] ORS;  Service: Orthopedics;  Laterality: Right;    There were no vitals filed for this visit.  Subjective Assessment - 01/16/19 1148    Subjective  He saw prosthetist after PT last week. Fit was good for 2-3 days. Friday it started hurting on lateral tibia  area.     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  Yes    Pain Score  3    in last week, worst 9/10   Pain Location  Leg   residual limb   Pain Orientation  Left;Lateral    Pain Descriptors / Indicators  Sharp    Pain Type  Acute pain    Pain Onset  1 to 4 weeks ago    Pain Frequency  Occasional    Aggravating Factors   pressure with standing & walking    Pain Relieving Factors  sitting    Effect of Pain on Daily Activities  limiting standing with prosthesis                       [facility] Adult PT Treatment/Exercise - 01/16/19 1145      Transfers   Transfers  Sit to Stand;Stand to Sit    Sit to Stand  5: Supervision;With upper extremity assist;From chair/3-in-1   chairs without armrests   Stand to Sit  5: Supervision;With upper extremity assist;To chair/3-in-1   chairs without  armrests     Ambulation/Gait   Ambulation/Gait  Yes    Ambulation/Gait Assistance  5: Supervision    Ambulation/Gait Assistance Details  partial weight on prosthesis    Ambulation Distance (Feet)  150 Feet    Assistive device  Prosthesis;Crutches;R Axillary Crutch;L Axillary Crutch    Ambulation Surface  Indoor;Level    Ramp  --    Curb  --      Therapeutic Activites    Therapeutic Activities  Lifting    Lifting  PT demo, instructed in technique with Transtibial prosthesis picking up objects from floor & lifting crate/box. Pt return demo understanding with supervision picking up light object, crate with 15# and then 25#.       Knee/Hip Exercises: Aerobic   Stepper  SciFit recumbent stepper Level 1 with BUEs & BLEs for 5 minutes       Prosthetics   Prosthetic Care Comments   PT called prosthetist to request decrease thickness of lateral pre-tibial pad. Appt at 1:00 today.     Current  prosthetic wear tolerance (days/week)   daily    Current prosthetic wear tolerance (#hours/day)   3hrs 2x/day;  increase to 4hrs 2x/day    Residual limb condition   no skin color or open areas, tenderness on lateral compartment.     Education Provided  Skin check;Residual limb care;Prosthetic cleaning;Correct ply sock adjustment;Proper Donning;Proper wear schedule/adjustment;Other (comment)   see prosthetic care comments   Person(s) Educated  Patient    Education Method  Explanation;Demonstration;Tactile cues;Verbal cues    Education Method  Verbalized understanding;Returned demonstration;Tactile cues required;Verbal cues required;Needs further instruction    Donning Prosthesis  Supervision          Balance Exercises - 01/16/19 1145      Balance Exercises: Standing   Standing Eyes Opened  Wide (BOA);Foam/compliant surface;Head turns;5 reps   crossways on foam beam, 4 direction head mvmts   Standing Eyes Closed  Wide (BOA);Foam/compliant surface;10 secs;3 reps   crossways on foam beam   Stepping Strategy  Anterior;Posterior;Foam/compliant surface   anticipated reactions stepping off foam beam with RLE & LLE   Rockerboard  Anterior/posterior;Lateral;Head turns;EO;10 reps;Other (comment);Intermittent UE support   weight shift/stabilization/recovery   Other Standing Exercises  wide stance green theraband reciprocal & BUE 10 reps: rowing, biceps curl & forward reach          PT Short Term Goals - 01/09/19 1816      PT SHORT TERM GOAL #1   Title  Patient demonstrates proper donning of prosthesis and verbalizes proper cleaning.  (All STGs Target Date: 02/02/2019)    Time  1    Period  Months    Status  On-going    Target Date  02/02/19      PT SHORT TERM GOAL #2   Title  Patient tolerates prosthesis wear >8hrs total per day without wound increasing size & no new wounds.     Time  1    Period  Months    Status  New    Target Date  04/04/19      PT SHORT TERM GOAL #3   Title   Berg Balance >/= 38/56    Time  1    Period  Months    Status  On-going    Target Date  04/04/19      PT SHORT TERM GOAL #4   Title  Patient ambulates 200' with cane & prosthesis with supervision.     Time  1  Period  Months    Status  On-going    Target Date  04/04/19      PT SHORT TERM GOAL #5   Title  Patient negotiates stairs with single rail, ramps & curbs with cane & prosthesis with minimal assist.     Time  1    Period  Months    Status  On-going    Target Date  04/04/19        PT Long Term Goals - 01/09/19 1816      PT LONG TERM GOAL #1   Title  Patient verbalizes & demonstrates proper prosthetic care to enable safe use of prosthesis. (All LTGs Target Date: 04/04/2019)    Time  3    Period  Months    Status  On-going    Target Date  04/04/19      PT LONG TERM GOAL #2   Title  Patient tolerates prosthesis wear >90% of awake hours without skin issues or limb pain to enable function throughout his day.      Time  3    Period  Months    Status  On-going    Target Date  04/04/19      PT LONG TERM GOAL #3   Title  Berg Balance >45/56 to indicate lower fall risk.     Time  3    Period  Months    Status  On-going    Target Date  04/04/19      PT LONG TERM GOAL #4   Title  Patient ambulates 500' outdoors including grass with LRAD & Prosthesis modified independent.     Time  3    Period  Months    Status  On-going    Target Date  04/04/19      PT LONG TERM GOAL #5   Title  Patient negotiates stairs, ramps & curbs with LRAD & prosthesis modified independent to enable community access.     Time  3    Period  Months    Status  On-going    Target Date  04/04/19            Plan - 01/16/19 2044    Clinical Impression Statement  Patient has lateral tibial pain which may be from too much pressure from pre-tibial pad which should improve with decreasing thickness. Today's skilled session focused on balance activities to facilitate ankle/residual limb, hip &  step strategies and improving visual, tactile & vestibular input.     Personal Factors and Comorbidities  Age;Comorbidity 3+;Past/Current Experience;Time since onset of injury/illness/exacerbation    Comorbidities  L TTA, DM2, neuropathy, HTN, RBBB,     Examination-Activity Limitations  Carry;Lift;Locomotion Level;Squat;Stairs;Stand;Transfers    Examination-Participation Restrictions  Community Activity;Driving;Yard Work    Conservation officer, historic buildings  Evolving/Moderate complexity    Rehab Potential  Good    PT Frequency  2x / week    PT Duration  12 weeks    PT Treatment/Interventions  ADLs/Self Care Home Management;DME Instruction;Gait training;Stair training;Functional mobility training;Therapeutic activities;Therapeutic exercise;Balance training;Neuromuscular re-education;Patient/family education;Prosthetic Training    PT Next Visit Plan  progress prosthetic care & wear, standing balance and prosthetic gait    Consulted and Agree with Plan of Care  Patient       Patient will benefit from skilled therapeutic intervention in order to improve the following deficits and impairments:  Abnormal gait, Decreased activity tolerance, Decreased balance, Decreased endurance, Decreased knowledge of use of DME, Decreased mobility, Decreased skin integrity, Decreased strength,  Postural dysfunction, Prosthetic Dependency, Pain  Visit Diagnosis: Abnormal posture  Other abnormalities of gait and mobility  Muscle weakness (generalized)  Unsteadiness on feet     Problem List Patient Active Problem List   Diagnosis Date Noted  . S/P BKA (below knee amputation), left (HCC) 10/13/2018  . Charcot foot due to diabetes mellitus (HCC) 01/26/2018  . PE (physical exam), annual 12/29/2016  . Neuropathy 12/29/2016  . BPH (benign prostatic hyperplasia) 12/29/2016  . Advanced care planning/counseling discussion 12/29/2016  . DM type 2 with diabetic peripheral neuropathy (HCC) 06/30/2016  . Anemia  07/28/2015  . Essential hypertension     Brenin Heidelberger PT, DPT 01/16/2019, 8:52 PM  [facility] [address] [place], [place], [address] Phone: [phone]   Fax:  [phone]  Name: Daniel Leon MRN: [id number] Date of Birth: 1951-07-21

## 2019-01-17 ENCOUNTER — Other Ambulatory Visit (INDEPENDENT_AMBULATORY_CARE_PROVIDER_SITE_OTHER): Payer: Self-pay | Admitting: Family

## 2019-01-23 ENCOUNTER — Other Ambulatory Visit: Payer: Self-pay

## 2019-01-23 ENCOUNTER — Ambulatory Visit: Payer: Medicare Other | Attending: Family | Admitting: Physical Therapy

## 2019-01-23 ENCOUNTER — Encounter: Payer: Self-pay | Admitting: Physical Therapy

## 2019-01-23 DIAGNOSIS — M6281 Muscle weakness (generalized): Secondary | ICD-10-CM | POA: Diagnosis present

## 2019-01-23 DIAGNOSIS — R2681 Unsteadiness on feet: Secondary | ICD-10-CM | POA: Insufficient documentation

## 2019-01-23 DIAGNOSIS — R293 Abnormal posture: Secondary | ICD-10-CM | POA: Diagnosis present

## 2019-01-23 DIAGNOSIS — R2689 Other abnormalities of gait and mobility: Secondary | ICD-10-CM | POA: Diagnosis present

## 2019-01-23 NOTE — Therapy (Signed)
[facility] [address] [place], [place], [address] Phone: [phone]   Fax:  [phone]  Physical Therapy Treatment  Patient Details  Name: Daniel Leon MRN: [id number] Date of Birth: Jul 05, 1951 Referring Provider (PT): [name], NP   Encounter Date: 01/23/2019   CLINIC OPERATION CHANGES: [facility]patient [facility] is open at lower capacity following universal masking, social distancing, and patient screening.    PT End of Session - 01/23/19 1103    Visit Number  4    Number of Visits  25    Date for PT Re-Evaluation  04/04/19    Authorization Type  [employer] Medicare & Medicaid    PT Start Time  1101    PT Stop Time  1145    PT Time Calculation (min)  44 min    Equipment Utilized During Treatment  Gait belt    Activity Tolerance  Patient tolerated treatment well    Behavior During Therapy  WFL for tasks assessed/performed       Past Medical History:  Diagnosis Date  . 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 Charcot ankle, left    collapse  . Charcot's joint of foot   . Diabetes mellitus without complication (HCC)    DIET-CONTROLLED  . Early cataracts, bilateral   . Essential hypertension   . Right bundle branch block (RBBB)   . Traumatic arthropathy of ankle and foot   . Wears glasses     Past Surgical History:  Procedure Laterality Date  . AMPUTATION Left 10/13/2018   Procedure: LEFT BELOW KNEE AMPUTATION;  Surgeon: [name], MD;  Location: [facility] OR;  Service: Orthopedics;  Laterality: Left;  . COLONOSCOPY    . ELBOW SURGERY Right 1966  . FOOT ARTHRODESIS, SUBTALAR Right 2019  . TONSILLECTOMY    . TOTAL ANKLE REPLACEMENT    . ULNAR NERVE TRANSPOSITION Right 09/05/2015   Procedure: ULNAR NERVE DECOMPRESSION/TRANSPOSITION;  Surgeon: [name], MD;  Location: [facility] ORS;  Service: Orthopedics;  Laterality: Right;    There were no vitals filed for this visit.  Subjective  Assessment - 01/23/19 1102    Subjective  Pt arrives using straight cane today with mild antagic pattern noted. Reports no falls. Reports prosthesis feels much better since seeing prosthetist last week.     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  No/denies    Pain Score  0-No pain           [facility] Adult PT Treatment/Exercise - 01/23/19 1104      Transfers   Transfers  Sit to Stand;Stand to Sit    Sit to Stand  5: Supervision;With upper extremity assist;From chair/3-in-1    Stand to Sit  5: Supervision;With upper extremity assist;To chair/3-in-1      Ambulation/Gait   Ambulation/Gait  Yes    Ambulation/Gait Assistance  5: Supervision;4: Min guard    Ambulation/Gait Assistance Details  cues needed on increased stance time/weight shifting onto prosthesis. pt demo's good sequencing with use of cane. no balance issues noted.     Ambulation Distance (Feet)  230 Feet   x1, plus around gym with activity   Assistive device  Straight cane;Prosthesis    Gait Pattern  Step-through pattern;Decreased step length - right;Decreased  stance time - left;Decreased hip/knee flexion - left;Decreased weight shift to left;Left circumduction;Left hip hike;Left foot flat;Left flexed knee in stance;Antalgic;Lateral hip instability;Trunk flexed;Abducted - left    Ambulation Surface  Level;Indoor    Stairs  Yes    Stairs Assistance  5: Supervision    Stairs Assistance Details (indicate cue type and reason)  cues for sequencing with cane/rail x 1 rep with each rail.     Stair Management Technique  One rail Right;One rail Left;Step to pattern;Forwards;With cane    Number of Stairs  4   x 2 rails   Ramp  Other (comment)   min guard assist with cane   Ramp Details (indicate cue type and reason)  with  cane/prosthesis cues on posture and sequencing.       High Level Balance   High Level Balance Activities  Negotitating around obstacles;Negotiating over obstacles;Side stepping;Marching forwards;Marching backwards    High Level Balance Comments  with cane/prosthesis: fwd stepping over bolsters of varied heights x 4 laps with cues on correct sequenicng for 1st lap progressing to no cues needed; stepping around hoola hoops in figure 8 pattern x 2 laps with cues on pelvic alignment, step length with stepping.; in parallel bars: side stepping without UE support for 3 laps each way, then fwd/bwd marching 3 laps each way with light/occasional touch to bars, min guard assist with both of these.       Neuro Re-ed    Neuro Re-ed Details   for balance/coordination; forward gait along ~50 foot hallway with- head movements left<>right, up<>down wiht min guard assist and cane.      Prosthetics   Prosthetic Care Comments   sweat management, signs of sweating    Current prosthetic wear tolerance (days/week)   daily    Current prosthetic wear tolerance (#hours/day)   increase to all awake    Residual limb condition   intact, bruising has healed    Education Provided  Residual limb care;Proper wear schedule/adjustment;Proper weight-bearing schedule/adjustment    Person(s) Educated  Patient    Education Method  Explanation;Demonstration    Education Method  Verbalized understanding;Verbal cues required;Returned demonstration;Needs further instruction    Donning Prosthesis  Supervision    Doffing Prosthesis  Supervision          Balance Exercises - 01/23/19 1134      Balance Exercises: Standing   Rockerboard  Anterior/posterior;Lateral      Balance Exercises: Standing   Rebounder Limitations  performed both ways on balance board: holding the board steady with EO alternating UE raises, progressing to bil UE raises with min guard to min assist for balance; holding the board steady with light fingertip  support- EC no head movements for 30 sec's x 2 reps with up to min assist for balance.           PT Short Term Goals - 01/09/19 1816      PT SHORT TERM GOAL #1   Title  Patient demonstrates proper donning of prosthesis and verbalizes proper cleaning.  (All STGs Target Date: 02/02/2019)    Time  1    Period  Months    Status  On-going    Target Date  02/02/19      PT SHORT TERM GOAL #2   Title  Patient tolerates prosthesis wear >8hrs total per day without wound increasing size & no new wounds.     Time  1    Period  Months    Status  New  Target Date  04/04/19      PT SHORT TERM GOAL #3   Title  Berg Balance >/= 38/56    Time  1    Period  Months    Status  On-going    Target Date  04/04/19      PT SHORT TERM GOAL #4   Title  Patient ambulates 200' with cane & prosthesis with supervision.     Time  1    Period  Months    Status  On-going    Target Date  04/04/19      PT SHORT TERM GOAL #5   Title  Patient negotiates stairs with single rail, ramps & curbs with cane & prosthesis with minimal assist.     Time  1    Period  Months    Status  On-going    Target Date  04/04/19        PT Long Term Goals - 01/09/19 1816      PT LONG TERM GOAL #1   Title  Patient verbalizes & demonstrates proper prosthetic care to enable safe use of prosthesis. (All LTGs Target Date: 04/04/2019)    Time  3    Period  Months    Status  On-going    Target Date  04/04/19      PT LONG TERM GOAL #2   Title  Patient tolerates prosthesis wear >90% of awake hours without skin issues or limb pain to enable function throughout his day.      Time  3    Period  Months    Status  On-going    Target Date  04/04/19      PT LONG TERM GOAL #3   Title  Berg Balance >45/56 to indicate lower fall risk.     Time  3    Period  Months    Status  On-going    Target Date  04/04/19      PT LONG TERM GOAL #4   Title  Patient ambulates 500' outdoors including grass with LRAD & Prosthesis modified  independent.     Time  3    Period  Months    Status  On-going    Target Date  04/04/19      PT LONG TERM GOAL #5   Title  Patient negotiates stairs, ramps & curbs with LRAD & prosthesis modified independent to enable community access.     Time  3    Period  Months    Status  On-going    Target Date  04/04/19            Plan - 01/23/19 1103    Clinical Impression Statement  Today's skilled session focused on prosthetic education and gait/balance with prosthesis/cane. No issues reported with session. The pt is progressing toward goals and should benefit from continued PT to progress toward unmet goals     Personal Factors and Comorbidities  Age;Comorbidity 3+;Past/Current Experience;Time since onset of injury/illness/exacerbation    Comorbidities  L TTA, DM2, neuropathy, HTN, RBBB,     Examination-Activity Limitations  Carry;Lift;Locomotion Level;Squat;Stairs;Stand;Transfers    Examination-Participation Restrictions  Community Activity;Driving;Yard Work    Conservation officer, historic buildings  Evolving/Moderate complexity    Rehab Potential  Good    PT Frequency  2x / week    PT Duration  12 weeks    PT Treatment/Interventions  ADLs/Self Care Home Management;DME Instruction;Gait training;Stair training;Functional mobility training;Therapeutic activities;Therapeutic exercise;Balance training;Neuromuscular re-education;Patient/family education;Prosthetic Training    PT Next  Visit Plan  standing balance and prosthetic gait    Consulted and Agree with Plan of Care  Patient       Patient will benefit from skilled therapeutic intervention in order to improve the following deficits and impairments:  Abnormal gait, Decreased activity tolerance, Decreased balance, Decreased endurance, Decreased knowledge of use of DME, Decreased mobility, Decreased skin integrity, Decreased strength, Postural dysfunction, Prosthetic Dependency, Pain  Visit Diagnosis: Abnormal posture  Other abnormalities  of gait and mobility  Muscle weakness (generalized)  Unsteadiness on feet     Problem List Patient Active Problem List   Diagnosis Date Noted  . S/P BKA (below knee amputation), left (HCC) 10/13/2018  . Charcot foot due to diabetes mellitus (HCC) 01/26/2018  . PE (physical exam), annual 12/29/2016  . Neuropathy 12/29/2016  . BPH (benign prostatic hyperplasia) 12/29/2016  . Advanced care planning/counseling discussion 12/29/2016  . DM type 2 with diabetic peripheral neuropathy (HCC) 06/30/2016  . Anemia 07/28/2015  . Essential hypertension    Sallyanne KusterKathy Palmyra Rogacki, [place], [facility] [address], [address] [place], [place] [address] [phone] 01/23/19, 12:52 PM   Name: Daniel Leon MRN: [id number] Date of Birth: 09/16/1950

## 2019-01-25 ENCOUNTER — Other Ambulatory Visit: Payer: Self-pay

## 2019-01-25 ENCOUNTER — Ambulatory Visit: Payer: Medicare Other | Admitting: Physical Therapy

## 2019-01-25 ENCOUNTER — Encounter: Payer: Self-pay | Admitting: Physical Therapy

## 2019-01-25 DIAGNOSIS — R2681 Unsteadiness on feet: Secondary | ICD-10-CM

## 2019-01-25 DIAGNOSIS — R293 Abnormal posture: Secondary | ICD-10-CM | POA: Diagnosis not present

## 2019-01-25 DIAGNOSIS — M6281 Muscle weakness (generalized): Secondary | ICD-10-CM

## 2019-01-25 DIAGNOSIS — R2689 Other abnormalities of gait and mobility: Secondary | ICD-10-CM

## 2019-01-26 NOTE — Therapy (Signed)
[facility] [address] [place], [place], [address] Phone: [phone]   Fax:  [phone]  Physical Therapy Treatment  Patient Details  Name: Daniel Leon MRN: [id number] Date of Birth: 1951/04/12 Referring Provider (PT): Barnie DelErin Zamora, NP   Encounter Date: 01/25/2019  PT End of Session - 01/25/19 1306    Visit Number  5    Number of Visits  25    Date for PT Re-Evaluation  04/04/19    Authorization Type  [employer]HC Medicare & Medicaid    PT Start Time  1302    PT Stop Time  1343    PT Time Calculation (min)  41 min    Equipment Utilized During Treatment  Gait belt    Activity Tolerance  Patient tolerated treatment well    Behavior During Therapy  WFL for tasks assessed/performed       Past Medical History:  Diagnosis Date  . 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 Charcot ankle, left    collapse  . Charcot's joint of foot   . Diabetes mellitus without complication (HCC)    DIET-CONTROLLED  . Early cataracts, bilateral   . Essential hypertension   . Right bundle branch block (RBBB)   . Traumatic arthropathy of ankle and foot   . Wears glasses     Past Surgical History:  Procedure Laterality Date  . AMPUTATION Left 10/13/2018   Procedure: LEFT BELOW KNEE AMPUTATION;  Surgeon: Nadara Mustarduda, Marcus V, MD;  [facility];  Service: Orthopedics;  Laterality: Left;  . COLONOSCOPY    . ELBOW SURGERY Right 1966  . FOOT ARTHRODESIS, SUBTALAR Right 2019  . TONSILLECTOMY    . TOTAL ANKLE REPLACEMENT    . ULNAR NERVE TRANSPOSITION Right 09/05/2015   Procedure: ULNAR NERVE DECOMPRESSION/TRANSPOSITION;  Surgeon: Myra Rudehristopher Smith, MD;  Location: [facility] ORS;  Service: Orthopedics;  Laterality: Right;    There were no vitals filed for this visit.  Subjective Assessment - 01/25/19 1304    Subjective  No new complaints. No falls. Was sore after last session, still sore in residual limbs.     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  Yes    Pain Score  4     Pain Location  Leg    Pain Orientation  Left    Pain Descriptors / Indicators  Tender;Sore    Pain Type  Acute pain    Pain Onset  In the past 7 days    Pain Frequency  Intermittent    Aggravating Factors   balance activities from last session.     Pain Relieving Factors  resting          [facility] Adult PT Treatment/Exercise - 01/25/19 1314      Transfers   Transfers  Sit to Stand;Stand to Sit    Sit to Stand  5: Supervision;With upper extremity assist;From chair/3-in-1    Stand to Sit  5: Supervision;With upper extremity assist;To chair/3-in-1      Ambulation/Gait   Ambulation/Gait  Yes    Ambulation/Gait Assistance  5: Supervision;4: Min guard    Ambulation/Gait Assistance Details  supervison on indoor surfaces. min guard to supervision on outdoor surfaces. no balance issues noted.     Ambulation Distance (  Feet)  210 Feet   x1 indoors, 250 x1 in/outdoors   Assistive device  Straight cane;Prosthesis    Gait Pattern  Step-through pattern;Decreased step length - right;Decreased stance time - left;Decreased hip/knee flexion - left;Decreased weight shift to left;Left circumduction;Left hip hike;Left foot flat;Left flexed knee in stance;Antalgic;Lateral hip instability;Trunk flexed;Abducted - left    Ambulation Surface  Level;Unlevel;Indoor;Outdoor;Paved;Gravel;Grass;Other (comment)   rubber mulch     Therapeutic Activites    Therapeutic Activities  Lifting    Lifting  PTA demo'd technique prior to pt performance. then had pt lift 15# crate,  then 25# crate x 5 reps each from/to floor, then lifting from floor, turning 90 degree turn to set on higher surface, then lifting from higher surface, turing 90 degree turn to set back on floor- x 5  resp with 15# crate, then 25# crate for 5 reps. cues needed on body mechanics and lifting technique with this activity.       Prosthetics   Prosthetic Care Comments   pt reports drying as needed with increased wear times    Current prosthetic wear tolerance (days/week)   daily    Current prosthetic wear tolerance (#hours/day)   all awake hours    Residual limb condition   intact with no issues per pt report    Donning Prosthesis  Supervision    Doffing Prosthesis  Supervision          Balance Exercises - 01/25/19 1315      Balance Exercises: Standing   Standing Eyes Closed  Wide (BOA);Head turns;Foam/compliant surface;3 reps;30 secs;Other reps (comment);Limitations    Balance Beam  standing across red beam with light UE support on bars: alternating forward stepping to floor/back onto beam, then alternating bwd stepping to floor/back onto beam. min guard assist with cues on step length/step height.        Balance Exercises: Standing   Standing Eyes Closed Limitations  on airex with feet hip width apart no UE support: EC no head movements, progressing to EO for head movements left<>right, then up<>down. min guard to min assist for balance with cues on posture and weight shifting for balance.          PT Short Term Goals - 01/09/19 1816      PT SHORT TERM GOAL #1   Title  Patient demonstrates proper donning of prosthesis and verbalizes proper cleaning.  (All STGs Target Date: 02/02/2019)    Time  1    Period  Months    Status  On-going    Target Date  02/02/19      PT SHORT TERM GOAL #2   Title  Patient tolerates prosthesis wear >8hrs total per day without wound increasing size & no new wounds.     Time  1    Period  Months    Status  New    Target Date  04/04/19      PT SHORT TERM GOAL #3   Title  Berg Balance >/= 38/56    Time  1    Period  Months    Status  On-going    Target Date  04/04/19      PT SHORT TERM GOAL #4   Title  Patient ambulates 200' with cane &  prosthesis with supervision.     Time  1    Period  Months    Status  On-going    Target Date  04/04/19      PT SHORT TERM GOAL #5  Title  Patient negotiates stairs with single rail, ramps & curbs with cane & prosthesis with minimal assist.     Time  1    Period  Months    Status  On-going    Target Date  04/04/19        PT Long Term Goals - 01/09/19 1816      PT LONG TERM GOAL #1   Title  Patient verbalizes & demonstrates proper prosthetic care to enable safe use of prosthesis. (All LTGs Target Date: 04/04/2019)    Time  3    Period  Months    Status  On-going    Target Date  04/04/19      PT LONG TERM GOAL #2   Title  Patient tolerates prosthesis wear >90% of awake hours without skin issues or limb pain to enable function throughout his day.      Time  3    Period  Months    Status  On-going    Target Date  04/04/19      PT LONG TERM GOAL #3   Title  Berg Balance >45/56 to indicate lower fall risk.     Time  3    Period  Months    Status  On-going    Target Date  04/04/19      PT LONG TERM GOAL #4   Title  Patient ambulates 500' outdoors including grass with LRAD & Prosthesis modified independent.     Time  3    Period  Months    Status  On-going    Target Date  04/04/19      PT LONG TERM GOAL #5   Title  Patient negotiates stairs, ramps & curbs with LRAD & prosthesis modified independent to enable community access.     Time  3    Period  Months    Status  On-going    Target Date  04/04/19            Plan - 01/25/19 1306    Clinical Impression Statement  Today's skilled session continued to address use of cane with gait, including on outdoor surfaces without any issues noted. Remainder of session address balance reactions with minor discomfort on prosthetic side reported that was releived with rest breaks. The pt is progressing well toward goals and should benefit from continued PT to progress toward unmet goals.     Personal Factors and Comorbidities   Age;Comorbidity 3+;Past/Current Experience;Time since onset of injury/illness/exacerbation    Comorbidities  L TTA, DM2, neuropathy, HTN, RBBB,     Examination-Activity Limitations  Carry;Lift;Locomotion Level;Squat;Stairs;Stand;Transfers    Examination-Participation Restrictions  Community Activity;Driving;Yard Work    Conservation officer, historic buildings  Evolving/Moderate complexity    Rehab Potential  Good    PT Frequency  2x / week    PT Duration  12 weeks    PT Treatment/Interventions  ADLs/Self Care Home Management;DME Instruction;Gait training;Stair training;Functional mobility training;Therapeutic activities;Therapeutic exercise;Balance training;Neuromuscular re-education;Patient/family education;Prosthetic Training    PT Next Visit Plan  standing balance and prosthetic gait    Consulted and Agree with Plan of Care  Patient       Patient will benefit from skilled therapeutic intervention in order to improve the following deficits and impairments:  Abnormal gait, Decreased activity tolerance, Decreased balance, Decreased endurance, Decreased knowledge of use of DME, Decreased mobility, Decreased skin integrity, Decreased strength, Postural dysfunction, Prosthetic Dependency, Pain  Visit Diagnosis: Abnormal posture  Other abnormalities of gait and mobility  Muscle weakness (  generalized)  Unsteadiness on feet     Problem List Patient Active Problem List   Diagnosis Date Noted  . S/P BKA (below knee amputation), left (HCC) 10/13/2018  . Charcot foot due to diabetes mellitus (HCC) 01/26/2018  . PE (physical exam), annual 12/29/2016  . Neuropathy 12/29/2016  . BPH (benign prostatic hyperplasia) 12/29/2016  . Advanced care planning/counseling discussion 12/29/2016  . DM type 2 with diabetic peripheral neuropathy (HCC) 06/30/2016  . Anemia 07/28/2015  . Essential hypertension     Sallyanne Kuster, [place], [facility] [address], [address] [place], [place]  [address] [phone] 01/26/19, 4:54 PM   Name: Daniel Leon MRN: [id number] Date of Birth: [birth year]

## 2019-01-30 ENCOUNTER — Encounter: Payer: Self-pay | Admitting: Physical Therapy

## 2019-01-30 ENCOUNTER — Ambulatory Visit: Payer: Medicare Other | Admitting: Physical Therapy

## 2019-01-30 ENCOUNTER — Other Ambulatory Visit: Payer: Self-pay

## 2019-01-30 DIAGNOSIS — M6281 Muscle weakness (generalized): Secondary | ICD-10-CM

## 2019-01-30 DIAGNOSIS — R293 Abnormal posture: Secondary | ICD-10-CM | POA: Diagnosis not present

## 2019-01-30 DIAGNOSIS — R2689 Other abnormalities of gait and mobility: Secondary | ICD-10-CM

## 2019-01-30 DIAGNOSIS — R2681 Unsteadiness on feet: Secondary | ICD-10-CM

## 2019-01-30 NOTE — Patient Instructions (Signed)
In hallway 1. Walk looking right/left, up/down & diagonals 2. Walk alternating eyes closed & eyes open 3. Side step right & left 4. Walk backwards 5. Stairs alternate only with 2 rails for now

## 2019-01-31 ENCOUNTER — Ambulatory Visit: Payer: Medicare Other | Admitting: Orthopedic Surgery

## 2019-01-31 NOTE — Therapy (Signed)
[facility] [address] [place], [place], [address] Phone: [phone]   Fax:  [phone]  Physical Therapy Treatment  Patient Details  Name: Daniel Leon MRN: [id number] Date of Birth: 10/30/1950 Referring Provider (PT): Dondra Prader, NP   Encounter Date: 01/30/2019   CLINIC OPERATION CHANGES: [facility] is open at lower capacity following universal masking, social distancing, and patient screening.  The patient's COVID risk of complications score is 5.   PT End of Session - 01/30/19 1349    Visit Number  6    Number of Visits  25    Date for PT Re-Evaluation  04/04/19    Authorization Type  [employer] Medicare & Medicaid    PT Start Time  1300    PT Stop Time  1345    PT Time Calculation (min)  45 min    Equipment Utilized During Treatment  Gait belt    Activity Tolerance  Patient tolerated treatment well    Behavior During Therapy  WFL for tasks assessed/performed       Past Medical History:  Diagnosis Date  . 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 Charcot ankle, left    collapse  . Charcot's joint of foot   . Diabetes mellitus without complication (HCC)    DIET-CONTROLLED  . Early cataracts, bilateral   . Essential hypertension   . Right bundle branch block (RBBB)   . Traumatic arthropathy of ankle and foot   . Wears glasses     Past Surgical History:  Procedure Laterality Date  . AMPUTATION Left 10/13/2018   Procedure: LEFT BELOW KNEE AMPUTATION;  Surgeon: Newt Minion, MD;  Location: [facility];  Service: Orthopedics;  Laterality: Left;  . COLONOSCOPY    . ELBOW SURGERY Right 1966  . FOOT ARTHRODESIS, SUBTALAR Right 2019  . TONSILLECTOMY    . TOTAL ANKLE REPLACEMENT    . ULNAR NERVE TRANSPOSITION Right 09/05/2015   Procedure: ULNAR NERVE DECOMPRESSION/TRANSPOSITION;  Surgeon: Christophe Louis, MD;  Location: [facility];  Service: Orthopedics;  Laterality: Right;     There were no vitals filed for this visit.  Subjective Assessment - 01/30/19 1256    Subjective  On Friday 4 days ago, he went back to work & walked up/down stairs a lot. He had severe pain in distal lateral area. He has been taking it easy over last 2 days.     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  No/denies    Pain Onset  In the past 7 days                       [facility] Adult PT Treatment/Exercise - 01/30/19 1300      Transfers   Transfers  Sit to Stand;Stand to Sit    Sit to Stand  5: Supervision;With upper extremity assist;From chair/3-in-1    Stand to Sit  5: Supervision;With upper extremity assist;To chair/3-in-1      Ambulation/Gait   Ambulation/Gait  Yes    Ambulation/Gait Assistance  5: Supervision;4: Min guard    Ambulation/Gait Assistance Details  PT demo, tactile & verbal cues on proper step width / not abducting using line on floor, proprioception and pelvic weight shift  Ambulation Distance (Feet)  500 Feet    Assistive device  Straight cane;Prosthesis    Gait Pattern  Step-through pattern;Decreased step length - right;Decreased stance time - left;Decreased hip/knee flexion - left;Decreased weight shift to left;Left circumduction;Left hip hike;Left foot flat;Left flexed knee in stance;Antalgic;Lateral hip instability;Trunk flexed;Abducted - left    Ambulation Surface  Indoor;Level    Stairs  Yes    Stairs Assistance  5: Supervision    Stairs Assistance Details (indicate cue type and reason)  step-to with weight shift over prosthesis in stance and prosthesis placement not abducted.  Progressed to alternating pattern with 2 rails with demo, tactile & verbal cues on technique    Stair Management Technique  One rail Left;With cane;Step to pattern;Two  rails;Alternating pattern;Forwards    Number of Stairs  4   2 reps step-to & 3 reps alternating   Height of Stairs  6      High Level Balance   High Level Balance Activities  Side stepping;Backward walking;Head turns;Negotiating over obstacles   cane & prosthesis   High Level Balance Comments  In hall for visual reference,  tactile & verbal cues on balance reactions and technique.  Head turns right/left, up/down & diagonals.  walking 2 steps EO & 2 steps EC.  proper weight shift & prosthesis placement with side step & backward.        Therapeutic Activites    Therapeutic Activities  --    Lifting  --      Prosthetics   Prosthetic Care Comments   PT educated on sudden increase in activity level can result in limb pain, may have seated deeper in socket causing pressure on distal fibula & head where nerve runs, and PT recommended keeping crutches in car so he can switch to them if he is going to be increasing activity level.  PT demo & instructed on use of ice massage to help area recover.     Current prosthetic wear tolerance (days/week)   daily    Current prosthetic wear tolerance (#hours/day)   all awake hours    Residual limb condition   no open areas, deep purple bruise & tenderness at distal lateral incision    Education Provided  Skin check;Residual limb care;Correct ply sock adjustment;Other (comment);Proper weight-bearing schedule/adjustment   see prosthetic care comments   Person(s) Educated  Patient    Education Method  Explanation;Demonstration;Tactile cues;Verbal cues    Education Method  Verbalized understanding;Needs further instruction;Verbal cues required    Donning Prosthesis  Modified independent (device/increased time)    Doffing Prosthesis  Modified independent (device/increased time)               PT Short Term Goals - 01/30/19 1846      PT SHORT TERM GOAL #1   Title  Patient demonstrates proper donning of prosthesis and verbalizes proper cleaning.  (All  STGs Target Date: 02/02/2019)    Baseline  MET 01/30/2019    Time  1    Period  Months    Status  Achieved    Target Date  02/02/19      PT SHORT TERM GOAL #2   Title  Patient tolerates prosthesis wear >8hrs total per day without wound increasing size & no new wounds.     Baseline  MET 01/30/2019    Time  1    Period  Months    Status  Achieved    Target Date  04/04/19      PT SHORT TERM GOAL #  3   Title  Berg Balance >/= 38/56    Time  1    Period  Months    Status  On-going    Target Date  04/04/19      PT SHORT TERM GOAL #4   Title  Patient ambulates 200' with cane & prosthesis with supervision.     Baseline  MET 01/30/2019    Time  1    Period  Months    Status  Achieved    Target Date  04/04/19      PT SHORT TERM GOAL #5   Title  Patient negotiates stairs with single rail, ramps & curbs with cane & prosthesis with minimal assist.     Baseline  MET 01/30/2019    Time  1    Period  Months    Status  Achieved    Target Date  04/04/19        PT Long Term Goals - 01/09/19 1816      PT LONG TERM GOAL #1   Title  Patient verbalizes & demonstrates proper prosthetic care to enable safe use of prosthesis. (All LTGs Target Date: 04/04/2019)    Time  3    Period  Months    Status  On-going    Target Date  04/04/19      PT LONG TERM GOAL #2   Title  Patient tolerates prosthesis wear >90% of awake hours without skin issues or limb pain to enable function throughout his day.      Time  3    Period  Months    Status  On-going    Target Date  04/04/19      PT LONG TERM GOAL #3   Title  Berg Balance >45/56 to indicate lower fall risk.     Time  3    Period  Months    Status  On-going    Target Date  04/04/19      PT LONG TERM GOAL #4   Title  Patient ambulates 500' outdoors including grass with LRAD & Prosthesis modified independent.     Time  3    Period  Months    Status  On-going    Target Date  04/04/19      PT LONG TERM GOAL #5   Title  Patient negotiates stairs,  ramps & curbs with LRAD & prosthesis modified independent to enable community access.     Time  3    Period  Months    Status  On-going    Target Date  04/04/19            Plan - 01/30/19 1350    Clinical Impression Statement  Patient appears to have limb pain from increasing his activity level to rapidly. PT educated on ice massage 2-3 times/day until pain subsides. PT educated on activity level, use of crutches and adjusting ply socks during day.  PT introduced advanced prosthetic gait activities of scanning, eyes closed for low vision, side stepping and backwards stepping.      Personal Factors and Comorbidities  Age;Comorbidity 3+;Past/Current Experience;Time since onset of injury/illness/exacerbation    Comorbidities  L TTA, DM2, neuropathy, HTN, RBBB,     Examination-Activity Limitations  Carry;Lift;Locomotion Level;Squat;Stairs;Stand;Transfers    Examination-Participation Restrictions  Community Activity;Driving;Yard Work    Stability/Clinical Decision Making  Evolving/Moderate complexity    Rehab Potential  Good    PT Frequency  2x / week    PT Duration  12 weeks  PT Treatment/Interventions  ADLs/Self Care Home Management;DME Instruction;Gait training;Stair training;Functional mobility training;Therapeutic activities;Therapeutic exercise;Balance training;Neuromuscular re-education;Patient/family education;Prosthetic Training    PT Next Visit Plan  check Berg for STG. Set new STGs. check limb & pain. standing balance and prosthetic gait    Consulted and Agree with Plan of Care  Patient       Patient will benefit from skilled therapeutic intervention in order to improve the following deficits and impairments:  Abnormal gait, Decreased activity tolerance, Decreased balance, Decreased endurance, Decreased knowledge of use of DME, Decreased mobility, Decreased skin integrity, Decreased strength, Postural dysfunction, Prosthetic Dependency, Pain  Visit Diagnosis: Abnormal  posture  Other abnormalities of gait and mobility  Muscle weakness (generalized)  Unsteadiness on feet     Problem List Patient Active Problem List   Diagnosis Date Noted  . S/P BKA (below knee amputation), left ([facility]) 10/13/2018  . Charcot foot due to diabetes mellitus ([facility]) 01/26/2018  . PE (physical exam), annual 12/29/2016  . Neuropathy 12/29/2016  . BPH (benign prostatic hyperplasia) 12/29/2016  . Advanced care planning/counseling discussion 12/29/2016  . DM type 2 with diabetic peripheral neuropathy ([facility]) 06/30/2016  . Anemia 07/28/2015  . Essential hypertension     Daniel Leon PT, DPT 01/31/2019, 9:00 AM  [facility] [address] [place], [place], [address] Phone: [phone]   Fax:  [phone]  Name: Daniel Leon MRN: [id number] Date of Birth: 1951-05-06

## 2019-02-01 ENCOUNTER — Other Ambulatory Visit: Payer: Self-pay

## 2019-02-01 ENCOUNTER — Ambulatory Visit: Payer: Medicare Other | Admitting: Physical Therapy

## 2019-02-01 ENCOUNTER — Encounter: Payer: Self-pay | Admitting: Physical Therapy

## 2019-02-01 DIAGNOSIS — M6281 Muscle weakness (generalized): Secondary | ICD-10-CM

## 2019-02-01 DIAGNOSIS — R293 Abnormal posture: Secondary | ICD-10-CM | POA: Diagnosis not present

## 2019-02-01 DIAGNOSIS — R2681 Unsteadiness on feet: Secondary | ICD-10-CM

## 2019-02-01 DIAGNOSIS — R2689 Other abnormalities of gait and mobility: Secondary | ICD-10-CM
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
## 2019-02-06 ENCOUNTER — Ambulatory Visit (INDEPENDENT_AMBULATORY_CARE_PROVIDER_SITE_OTHER): Payer: Medicare Other | Admitting: Physician Assistant

## 2019-02-06 ENCOUNTER — Encounter: Payer: Self-pay | Admitting: Orthopedic Surgery

## 2019-02-06 ENCOUNTER — Encounter: Payer: Self-pay | Admitting: Physical Therapy

## 2019-02-06 ENCOUNTER — Ambulatory Visit: Payer: Medicare Other | Admitting: Physical Therapy

## 2019-02-06 ENCOUNTER — Other Ambulatory Visit: Payer: Self-pay

## 2019-02-06 VITALS — Ht 76.0 in | Wt 250.0 lb

## 2019-02-06 DIAGNOSIS — Z89512 Acquired absence of left leg below knee: Secondary | ICD-10-CM | POA: Diagnosis not present

## 2019-02-06 DIAGNOSIS — R293 Abnormal posture: Secondary | ICD-10-CM

## 2019-02-06 DIAGNOSIS — E11610 Type 2 diabetes mellitus with diabetic neuropathic arthropathy: Secondary | ICD-10-CM

## 2019-02-06 DIAGNOSIS — R2689 Other abnormalities of gait and mobility: Secondary | ICD-10-CM

## 2019-02-06 DIAGNOSIS — M6281 Muscle weakness (generalized): Secondary | ICD-10-CM

## 2019-02-06 DIAGNOSIS — R2681 Unsteadiness on feet: Secondary | ICD-10-CM

## 2019-02-06 NOTE — Therapy (Signed)
[facility] [address] [place], [place], [address] Phone: [phone]   Fax:  [phone]  Physical Therapy Treatment  Patient Details  Name: Daniel Leon MRN: [id number] Date of Birth: 1950-10-10 Referring Provider (PT): Barnie DelErin Zamora, NP   Encounter Date: 02/06/2019   CLINIC OPERATION CHANGES: Outpatient Neuro Rehab is open at lower capacity following universal masking, social distancing, and patient screening.  The patient's COVID risk of complications score is 5.   PT End of Session - 02/06/19 1351    Visit Number  8    Number of Visits  25    Date for PT Re-Evaluation  04/04/19    Authorization Type  [employer] Medicare & Medicaid    PT Start Time  1301    PT Stop Time  1340    PT Time Calculation (min)  39 min    Equipment Utilized During Treatment  Gait belt    Activity Tolerance  Patient tolerated treatment well    Behavior During Therapy  WFL for tasks assessed/performed       Past Medical History:  Diagnosis Date  . 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 Charcot ankle, left    collapse  . Charcot's joint of foot   . Diabetes mellitus without complication (HCC)    DIET-CONTROLLED  . Early cataracts, bilateral   . Essential hypertension   . Right bundle branch block (RBBB)   . Traumatic arthropathy of ankle and foot   . Wears glasses     Past Surgical History:  Procedure Laterality Date  . AMPUTATION Left 10/13/2018   Procedure: LEFT BELOW KNEE AMPUTATION;  Surgeon: Nadara Mustarduda, Marcus V, MD;  [facility];  Service: Orthopedics;  Laterality: Left;  . COLONOSCOPY    . ELBOW SURGERY Right 1966  . FOOT ARTHRODESIS, SUBTALAR Right 2019  . TONSILLECTOMY    . TOTAL ANKLE REPLACEMENT    . ULNAR NERVE TRANSPOSITION Right 09/05/2015   Procedure: ULNAR NERVE DECOMPRESSION/TRANSPOSITION;  Surgeon: Myra Rudehristopher Smith, MD;  Location: [facility] ORS;  Service: Orthopedics;  Laterality: Right;     There were no vitals filed for this visit.  Subjective Assessment - 02/06/19 1301    Subjective  His leg was getting better. He sat in a compact car for ~ hour on Saturday which required a lot knee bend. His leg is hurting more near distal tibia instead of fibula.    Currently in Pain?  Yes    Pain Score  5    with prosthesis: sitting 0/10, standing 3/10, gait with crutches 5/10   Pain Location  Leg    Pain Orientation  Left;Distal;Lateral    Pain Descriptors / Indicators  Sore    Pain Type  Acute pain    Pain Onset  1 to 4 weeks ago    Pain Frequency  Intermittent    Aggravating Factors   fall on 6/4 with prosthesis off started pain,  now sore with pressure    Pain Relieving Factors  not standing or walking, using crutches is helping to control pain                       [facility] Adult PT Treatment/Exercise - 02/06/19 1300      Transfers   Transfers  Sit to Stand;Stand to Sit    Sit to Stand  5: Supervision;With upper extremity assist;From chair/3-in-1    Stand to Sit  5: Supervision;With upper extremity assist;To chair/3-in-1  Ambulation/Gait   Ambulation/Gait  Yes    Ambulation/Gait Assistance  5: Supervision    Ambulation/Gait Assistance Details  cues to use cane for short distances like room to room at home, use pain as guide for distance & repetitions (pain should not increase >2 increments on 0-10 scale).  Pt verbalized understanding.     Ambulation Distance (Feet)  50 Feet   50' X 4 with cane,    Assistive device  Crutches;Prosthesis;Straight cane   enter & exit with crutches   Gait Pattern  Step-through pattern;Decreased step length - right;Decreased stance time - left;Decreased hip/knee flexion - left;Decreased weight shift to left;Left circumduction;Left hip hike;Left foot flat;Left flexed knee in stance;Antalgic;Lateral hip instability;Trunk flexed;Abducted - left    Ambulation Surface  Indoor;Level      Neuro Re-ed    Neuro Re-ed Details    standing with feet hip width apart bilateral stance:  green theraband reciprocal  & BUEs 10 reps each - rows, forward reach, bicpes curls & downward pull.       Knee/Hip Exercises: Aerobic   Stepper  SciFit recumbent stepper Level 1 with BUEs & BLEs for 5 minutes       Prosthetics   Prosthetic Care Comments   continue ice massage to area for pain relief and decreased edema    Current prosthetic wear tolerance (days/week)   daily    Current prosthetic wear tolerance (#hours/day)   all awake hours    Residual limb condition   no open areas, deep purple bruise has recovered to baseline color & tenderness at distal lateral tibia now more than fibula area, mild swelling noted at this area as well.     Education Provided  Skin check;Residual limb care;Correct ply sock adjustment;Proper wear schedule/adjustment;Proper weight-bearing schedule/adjustment    Person(s) Educated  Patient    Education Method  Explanation;Demonstration;Tactile cues;Verbal cues    Education Method  Verbalized understanding;Needs further instruction          Balance Exercises - 02/06/19 1300      Balance Exercises: Standing   Standing Eyes Opened  Wide (BOA);Solid surface;5 reps    Rockerboard  Anterior/posterior;Lateral;EO;5 reps   wt shift, stabilization & recovery       PT Education - 02/06/19 2338    Education Details  HEP standing with green theraband rows, forward reach, biceps curls & pull downs:  reciprocal & BUEs.    Person(s) Educated  Patient    Methods  Explanation;Demonstration;Tactile cues;Verbal cues    Comprehension  Verbalized understanding;Returned demonstration       PT Short Term Goals - 02/06/19 1352      PT SHORT TERM GOAL #1   Title  Patient verbalizes residual limb pain management including modifying assistive device.  (All STGs Target Date: 03/02/2019)    Time  1    Status  New    Target Date  03/02/19      PT SHORT TERM GOAL #2   Title  Patient tolerates prosthesis wear >14hrs  total per day without limb pain 0/10 sitting & <3/10 standing.    Time  1    Period  Months    Status  New    Target Date  03/02/19      PT SHORT TERM GOAL #3   Title  Berg Balance >/=  41/56    Time  1    Period  Months    Status  Revised    Target Date  03/02/19  PT SHORT TERM GOAL #4   Title  Patient ambulates 300' with cane & prosthesis with supervision.    Time  1    Period  Months    Status  Revised    Target Date  03/02/19      PT SHORT TERM GOAL #5   Title  Patient negotiates stairs with single rail, ramps & curbs with cane & prosthesis with supervision.    Time  1    Period  Months    Status  Revised    Target Date  03/02/19        PT Long Term Goals - 01/09/19 1816      PT LONG TERM GOAL #1   Title  Patient verbalizes & demonstrates proper prosthetic care to enable safe use of prosthesis. (All LTGs Target Date: 04/04/2019)    Time  3    Period  Months    Status  On-going    Target Date  04/04/19      PT LONG TERM GOAL #2   Title  Patient tolerates prosthesis wear >90% of awake hours without skin issues or limb pain to enable function throughout his day.      Time  3    Period  Months    Status  On-going    Target Date  04/04/19      PT LONG TERM GOAL #3   Title  Berg Balance >45/56 to indicate lower fall risk.     Time  3    Period  Months    Status  On-going    Target Date  04/04/19      PT LONG TERM GOAL #4   Title  Patient ambulates 500' outdoors including grass with LRAD & Prosthesis modified independent.     Time  3    Period  Months    Status  On-going    Target Date  04/04/19      PT LONG TERM GOAL #5   Title  Patient negotiates stairs, ramps & curbs with LRAD & prosthesis modified independent to enable community access.     Time  3    Period  Months    Status  On-going    Target Date  04/04/19            Plan - 02/06/19 1356    Clinical Impression Statement  Today's session focused on slow, limited weight bearing pressure  on residual limb to minimize pain increases to 2 or less on 0-10 scale.    Personal Factors and Comorbidities  Age;Comorbidity 3+;Past/Current Experience;Time since onset of injury/illness/exacerbation    Comorbidities  L TTA, DM2, neuropathy, HTN, RBBB,     Examination-Activity Limitations  Carry;Lift;Locomotion Level;Squat;Stairs;Stand;Transfers    Examination-Participation Restrictions  Community Activity;Driving;Yard Work    Conservation officer, historic buildingstability/Clinical Decision Making  Evolving/Moderate complexity    Rehab Potential  Good    PT Frequency  2x / week    PT Duration  12 weeks    PT Treatment/Interventions  ADLs/Self Care Home Management;DME Instruction;Gait training;Stair training;Functional mobility training;Therapeutic activities;Therapeutic exercise;Balance training;Neuromuscular re-education;Patient/family education;Prosthetic Training    PT Next Visit Plan  work towards updated STGs; check limb & pain. standing balance and prosthetic gait    Consulted and Agree with Plan of Care  Patient       Patient will benefit from skilled therapeutic intervention in order to improve the following deficits and impairments:  Abnormal gait, Decreased activity tolerance, Decreased balance, Decreased endurance, Decreased knowledge of use of DME,  Decreased mobility, Decreased skin integrity, Decreased strength, Postural dysfunction, Prosthetic Dependency, Pain  Visit Diagnosis: 1. Other abnormalities of gait and mobility   2. Muscle weakness (generalized)   3. Unsteadiness on feet   4. Abnormal posture        Problem List Patient Active Problem List   Diagnosis Date Noted  . S/P BKA (below knee amputation), left (HCC) 10/13/2018  . Charcot foot due to diabetes mellitus (HCC) 01/26/2018  . PE (physical exam), annual 12/29/2016  . Neuropathy 12/29/2016  . BPH (benign prostatic hyperplasia) 12/29/2016  . Advanced care planning/counseling discussion 12/29/2016  . DM type 2 with diabetic peripheral  neuropathy (HCC) 06/30/2016  . Anemia 07/28/2015  . Essential hypertension     Donnetta Gillin PT, DPT 02/06/2019, 11:48 PM  [facility] [address] [place], [place], [address] Phone: [phone]   Fax:  [phone]  Name: Daniel Leon MRN: [id number] Date of Birth: [birth year]

## 2019-02-07 ENCOUNTER — Encounter: Payer: Self-pay | Admitting: Physician Assistant

## 2019-02-07 NOTE — Progress Notes (Signed)
Office Visit Note   Patient: Daniel Leon           Date of Birth: 1951-06-19           MRN: [id number] Visit Date: 02/06/2019              Requested by: Guadalupe Maple, MD [address] [place],  [place] [address] PCP: Guadalupe Maple, MD  Chief Complaint  Patient presents with  . Left Leg - Follow-up    10/13/18 left BKA      HPI: The patient is a 68 year old gentleman who is about 4 months status post a left transtibial amputation.  He has been doing extremely well and ambulating with a prosthesis from [facility] with physical therapy.  He does report he had a fall on 01/26/2019 resulting in a bruise over the lateral residual limb.  He reports that initially he was having enough discomfort over the area that he had to resume walking with bilateral crutches rather than a single cane but that the pain has improved substantially since this time.  He is still walking with bilateral crutches.  He is working with physical therapy 2 times weekly.  He has been taking gabapentin for phantom pain.  Assessment & Plan: Visit Diagnoses:  1. S/P BKA (below knee amputation), left ([place])   2. Diabetic Charct's arthropathy ([place])     Plan: Continue to walk with bilateral crutches until his residual discomfort over the lateral limb has completely resolved.  He will return should he develop any worsening of his symptoms or breakdown over the area.  He will continue to be monitored in physical therapy as well. Follow-up in 3 months or sooner should he have difficulties in the interim.  Follow-Up Instructions: Return in about 3 months (around 05/09/2019).   Ortho Exam  Patient is alert, oriented, no adenopathy, well-dressed, normal affect, normal respiratory effort. The left transtibial amputation site is well-healed and well consolidated.  There is no breakdown of the skin.  He has a very small area of residual bruising less than one half of a centimeter across over the lateral incision  line.  There is no opening of the area.  No palpable consolidated hematoma and he is minimally tender to palpation now over this area.  There is no edema over the distal residual limb.  No erythema no signs of cellulitis or infection.  He has full knee extension.  He ambulates with bilateral crutches with minimal antalgic component.  Imaging: No results found. No images are attached to the encounter.  Labs: Lab Results  Component Value Date   HGBA1C 5.7 (H) 10/13/2018   HGBA1C 5.8 07/13/2018   HGBA1C 5.9 07/28/2017     Lab Results  Component Value Date   ALBUMIN 4.0 01/26/2018   ALBUMIN 4.4 12/29/2016   ALBUMIN 4.2 12/24/2015    Body mass index is 30.43 kg/m.  Orders:  No orders of the defined types were placed in this encounter.  No orders of the defined types were placed in this encounter.    Procedures: No procedures performed  Clinical Data: No additional findings.  ROS:  All other systems negative, except as noted in the HPI. Review of Systems  Objective: Vital Signs: Ht 6\' 4"  (1.93 m)   Wt 250 lb (113.4 kg)   BMI 30.43 kg/m   Specialty Comments:  No specialty comments available.  PMFS History: Patient Active Problem List   Diagnosis Date Noted  . S/P BKA (below  knee amputation), left (HCC) 10/13/2018  . Charcot foot due to diabetes mellitus (HCC) 01/26/2018  . PE (physical exam), annual 12/29/2016  . Neuropathy 12/29/2016  . BPH (benign prostatic hyperplasia) 12/29/2016  . Advanced care planning/counseling discussion 12/29/2016  . DM type 2 with diabetic peripheral neuropathy (HCC) 06/30/2016  . Anemia 07/28/2015  . Essential hypertension    Past Medical History:  Diagnosis Date  . 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 Charcot ankle, left    collapse  . Charcot's joint of foot   . Diabetes mellitus without complication (HCC)    DIET-CONTROLLED  . Early cataracts, bilateral   . Essential hypertension   . Right bundle  branch block (RBBB)   . Traumatic arthropathy of ankle and foot   . Wears glasses     Family History  Problem Relation Age of Onset  . Heart disease Mother   . Hypertension Mother   . Heart disease Father   . Hypertension Father   . Hypertension Brother   . Prostate cancer Neg Hx   . Bladder Cancer Neg Hx   . Kidney cancer Neg Hx     Past Surgical History:  Procedure Laterality Date  . AMPUTATION Left 10/13/2018   Procedure: LEFT BELOW KNEE AMPUTATION;  Surgeon: Nadara Mustarduda, Marcus V, MD;  [facility];  Service: Orthopedics;  Laterality: Left;  . COLONOSCOPY    . ELBOW SURGERY Right 1966  . FOOT ARTHRODESIS, SUBTALAR Right 2019  . TONSILLECTOMY    . TOTAL ANKLE REPLACEMENT    . ULNAR NERVE TRANSPOSITION Right 09/05/2015   Procedure: ULNAR NERVE DECOMPRESSION/TRANSPOSITION;  Surgeon: Myra Rudehristopher Smith, MD;  Location: [facility] ORS;  Service: Orthopedics;  Laterality: Right;   Social History   Occupational History  . Not on file  Tobacco Use  . Smoking status: Former Smoker    Packs/day: 0.50    Years: 10.00    Pack years: 5.00    Types: Cigarettes    Quit date: 07/27/2000    Years since quitting: 18.5  . Smokeless tobacco: Never Used  Substance and Sexual Activity  . Alcohol use: Yes    Comment: very little  . Drug use: No  . Sexual activity: Not on file

## 2019-02-08 ENCOUNTER — Encounter: Payer: Self-pay | Admitting: Physical Therapy

## 2019-02-08 ENCOUNTER — Other Ambulatory Visit: Payer: Self-pay

## 2019-02-08 ENCOUNTER — Ambulatory Visit: Payer: Medicare Other | Admitting: Physical Therapy

## 2019-02-08 DIAGNOSIS — R2681 Unsteadiness on feet: Secondary | ICD-10-CM

## 2019-02-08 DIAGNOSIS — R2689 Other abnormalities of gait and mobility: Secondary | ICD-10-CM

## 2019-02-08 DIAGNOSIS — R293 Abnormal posture: Secondary | ICD-10-CM | POA: Diagnosis not present

## 2019-02-08 DIAGNOSIS — M6281 Muscle weakness (generalized): Secondary | ICD-10-CM

## 2019-02-08 NOTE — Therapy (Signed)
[facility] [address] [place], [place], [address] Phone: [phone]   Fax:  [phone]  Physical Therapy Treatment  Patient Details  Name: Daniel EhlersMichael H Aubert MRN: [id number] Date of Birth: October 21, 1950 Referring Provider (PT): Barnie DelErin Zamora, NP   Encounter Date: 02/08/2019   CLINIC OPERATION CHANGES: Outpatient Neuro Rehab is open at lower capacity following universal masking, social distancing, and patient screening.    PT End of Session - 02/08/19 1304    Visit Number  9    Number of Visits  25    Date for PT Re-Evaluation  04/04/19    Authorization Type  [employer] Medicare & Medicaid    PT Start Time  1258    PT Stop Time  1339    PT Time Calculation (min)  41 min    Equipment Utilized During Treatment  Gait belt    Activity Tolerance  Patient tolerated treatment well    Behavior During Therapy  WFL for tasks assessed/performed       Past Medical History:  Diagnosis Date  . 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 Charcot ankle, left    collapse  . Charcot's joint of foot   . Diabetes mellitus without complication (HCC)    DIET-CONTROLLED  . Early cataracts, bilateral   . Essential hypertension   . Right bundle branch block (RBBB)   . Traumatic arthropathy of ankle and foot   . Wears glasses     Past Surgical History:  Procedure Laterality Date  . AMPUTATION Left 10/13/2018   Procedure: LEFT BELOW KNEE AMPUTATION;  Surgeon: Nadara Mustarduda, Marcus V, MD;  [facility];  Service: Orthopedics;  Laterality: Left;  . COLONOSCOPY    . ELBOW SURGERY Right 1966  . FOOT ARTHRODESIS, SUBTALAR Right 2019  . TONSILLECTOMY    . TOTAL ANKLE REPLACEMENT    . ULNAR NERVE TRANSPOSITION Right 09/05/2015   Procedure: ULNAR NERVE DECOMPRESSION/TRANSPOSITION;  Surgeon: Myra Rudehristopher Smith, MD;  Location: [facility] ORS;  Service: Orthopedics;  Laterality: Right;    There were no vitals filed for this visit.  Subjective  Assessment - 02/08/19 1301    Subjective  Left leg still sore, getting better. Using cane for short distances in home, crutches for longer distances/outdoors. No new falls. Still using ice to help with pain/swelling.    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  Yes    Pain Score  3    3/10 sitting, 5/10 with weight bearing   Pain Location  Leg    Pain Orientation  Left;Distal;Lateral    Pain Descriptors / Indicators  Sore    Pain Type  Acute pain    Pain Onset  1 to 4 weeks ago    Pain Frequency  Intermittent    Aggravating Factors   fall on 6/4 wiht prostheiss off started pain, now sore with pressure    Pain Relieving Factors  not standing or walker, using crutches for longer distances, ice massage          [facility] Adult PT Treatment/Exercise - 02/08/19 1305      Transfers   Transfers  Sit to Stand;Stand to Sit    Sit to Stand  5: Supervision;With upper extremity assist;From chair/3-in-1    Stand to  Sit  5: Supervision;With upper extremity assist;To chair/3-in-1      Ambulation/Gait   Ambulation/Gait  Yes    Ambulation/Gait Assistance  5: Supervision;4: Min guard    Ambulation/Gait Assistance Details  supervision with crutches and cane on straight pathways. min guard assist with cane for dynamic activities with gait- see neuro reed comments    Ambulation Distance (Feet)  115 Feet   x 3, plus around gym   Assistive device  Crutches;Prosthesis;Straight cane    Gait Pattern  Step-through pattern;Decreased step length - right;Decreased stance time - left;Decreased hip/knee flexion - left;Decreased weight shift to left;Left circumduction;Left hip hike;Left foot flat;Left flexed knee in stance;Antalgic;Lateral hip instability;Trunk flexed;Abducted - left    Ambulation Surface  Level;Indoor       Neuro Re-ed    Neuro Re-ed Details   for balance/NMR: gait around track with scanning all directions x 2 laps with cane/prosthesis, min guard assist for balance.       Knee/Hip Exercises: Aerobic   Other Aerobic  Scifit UE/LE's level 2.5 for 8 minutes with goal >/= 50 steps per minute for strengthening and activity tolerance       Prosthetics   Current prosthetic wear tolerance (days/week)   daily    Current prosthetic wear tolerance (#hours/day)   all awake hours    Residual limb condition   no open areas,  tenderness at distal lateral tibia now more than fibula area, mild swelling noted at this area as well.     Education Provided  Skin check;Residual limb care;Correct ply sock adjustment;Proper wear schedule/adjustment;Proper weight-bearing schedule/adjustment    Person(s) Educated  Patient    Education Method  Explanation;Verbal cues;Demonstration    Education Method  Verbalized understanding;Verbal cues required;Needs further instruction    Donning Prosthesis  Modified independent (device/increased time)    Doffing Prosthesis  Modified independent (device/increased time)          Balance Exercises - 02/08/19 1325      Balance Exercises: Standing   Rockerboard  Anterior/posterior;EO;EC;30 seconds;10 reps      Balance Exercises: Standing   Rebounder Limitations  on rocker board ant/post direction: holding board steady alternating UE raises, progressing to bil UE raises with min guard to min assist for balance; with hands hovering above bars, touching as needed- EC no head movements for 30 sec's x 3 reps, min guard to min assist for balance.           PT Short Term Goals - 02/06/19 1352      PT SHORT TERM GOAL #1   Title  Patient verbalizes residual limb pain management including modifying assistive device.  (All STGs Target Date: 03/02/2019)    Time  1    Status  New    Target Date  03/02/19      PT SHORT TERM GOAL #2   Title  Patient tolerates prosthesis wear  >14hrs total per day without limb pain 0/10 sitting & <3/10 standing.    Time  1    Period  Months    Status  New    Target Date  03/02/19      PT SHORT TERM GOAL #3   Title  Berg Balance >/=  41/56    Time  1    Period  Months    Status  Revised    Target Date  03/02/19      PT SHORT TERM GOAL #4   Title  Patient ambulates 300' with cane & prosthesis with supervision.  Time  1    Period  Months    Status  Revised    Target Date  03/02/19      PT SHORT TERM GOAL #5   Title  Patient negotiates stairs with single rail, ramps & curbs with cane & prosthesis with supervision.    Time  1    Period  Months    Status  Revised    Target Date  03/02/19        PT Long Term Goals - 01/09/19 1816      PT LONG TERM GOAL #1   Title  Patient verbalizes & demonstrates proper prosthetic care to enable safe use of prosthesis. (All LTGs Target Date: 04/04/2019)    Time  3    Period  Months    Status  On-going    Target Date  04/04/19      PT LONG TERM GOAL #2   Title  Patient tolerates prosthesis wear >90% of awake hours without skin issues or limb pain to enable function throughout his day.      Time  3    Period  Months    Status  On-going    Target Date  04/04/19      PT LONG TERM GOAL #3   Title  Berg Balance >45/56 to indicate lower fall risk.     Time  3    Period  Months    Status  On-going    Target Date  04/04/19      PT LONG TERM GOAL #4   Title  Patient ambulates 500' outdoors including grass with LRAD & Prosthesis modified independent.     Time  3    Period  Months    Status  On-going    Target Date  04/04/19      PT LONG TERM GOAL #5   Title  Patient negotiates stairs, ramps & curbs with LRAD & prosthesis modified independent to enable community access.     Time  3    Period  Months    Status  On-going    Target Date  04/04/19            Plan - 02/08/19 1304    Clinical Impression Statement  Today's skilled session continued to focus on activity  tolerance, strengthening and balance with rest breaks through out to off load prosthesis to decrease pain. No increase in pain reported with session. The pt is progressing well toward goals and should benefit from continued PT to progress toward unmet goals.    Personal Factors and Comorbidities  Age;Comorbidity 3+;Past/Current Experience;Time since onset of injury/illness/exacerbation    Comorbidities  L TTA, DM2, neuropathy, HTN, RBBB,     Examination-Activity Limitations  Carry;Lift;Locomotion Level;Squat;Stairs;Stand;Transfers    Examination-Participation Restrictions  Community Activity;Driving;Yard Work    Conservation officer, historic buildingstability/Clinical Decision Making  Evolving/Moderate complexity    Rehab Potential  Good    PT Frequency  2x / week    PT Duration  12 weeks    PT Treatment/Interventions  ADLs/Self Care Home Management;DME Instruction;Gait training;Stair training;Functional mobility training;Therapeutic activities;Therapeutic exercise;Balance training;Neuromuscular re-education;Patient/family education;Prosthetic Training    PT Next Visit Plan 10th visit progress note due next visit; work towards updated STGs; check limb & pain. standing balance and prosthetic gait    Consulted and Agree with Plan of Care  Patient       Patient will benefit from skilled therapeutic intervention in order to improve the following deficits and impairments:  Abnormal gait, Decreased activity  tolerance, Decreased balance, Decreased endurance, Decreased knowledge of use of DME, Decreased mobility, Decreased skin integrity, Decreased strength, Postural dysfunction, Prosthetic Dependency, Pain  Visit Diagnosis: 1. Other abnormalities of gait and mobility   2. Muscle weakness (generalized)   3. Unsteadiness on feet   4. Abnormal posture        Problem List Patient Active Problem List   Diagnosis Date Noted  . S/P BKA (below knee amputation), left (HCC) 10/13/2018  . Charcot foot due to diabetes mellitus (HCC)  01/26/2018  . PE (physical exam), annual 12/29/2016  . Neuropathy 12/29/2016  . BPH (benign prostatic hyperplasia) 12/29/2016  . Advanced care planning/counseling discussion 12/29/2016  . DM type 2 with diabetic peripheral neuropathy (HCC) 06/30/2016  . Anemia 07/28/2015  . Essential hypertension     Sallyanne KusterKathy Afshin Chrystal, PTA, [facility] [address], [address] [place], [place] [address] [phone] 02/08/19, 2:23 PM   Name: Daniel EhlersMichael H Leon MRN: [id number] Date of Birth: October 10, 1950

## 2019-02-13 ENCOUNTER — Encounter: Payer: Self-pay | Admitting: Physical Therapy

## 2019-02-13 ENCOUNTER — Other Ambulatory Visit: Payer: Self-pay

## 2019-02-13 ENCOUNTER — Ambulatory Visit: Payer: Medicare Other | Admitting: Physical Therapy

## 2019-02-13 DIAGNOSIS — M6281 Muscle weakness (generalized): Secondary | ICD-10-CM

## 2019-02-13 DIAGNOSIS — R293 Abnormal posture: Secondary | ICD-10-CM

## 2019-02-13 DIAGNOSIS — R2681 Unsteadiness on feet: Secondary | ICD-10-CM

## 2019-02-13 DIAGNOSIS — R2689 Other abnormalities of gait and mobility: Secondary | ICD-10-CM

## 2019-02-13 NOTE — Therapy (Signed)
[facility] [address] [place], [place], [address] Phone: [phone]   Fax:  [phone]  Physical Therapy Treatment  Patient Details  Name: Daniel Leon MRN: [id number] Date of Birth: 03/19/1951 Referring Provider (PT): Dondra Prader, NP   Encounter Date: 02/13/2019  PT End of Session - 02/13/19 1304    Visit Number  10    Number of Visits  25    Date for PT Re-Evaluation  04/04/19    Authorization Type  UHC Medicare & Medicaid    PT Start Time  1300    PT Stop Time  1342    PT Time Calculation (min)  42 min    Equipment Utilized During Treatment  Gait belt    Activity Tolerance  Patient tolerated treatment well    Behavior During Therapy  WFL for tasks assessed/performed       Past Medical History:  Diagnosis Date  . 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 Charcot ankle, left    collapse  . Charcot's joint of foot   . Diabetes mellitus without complication (HCC)    DIET-CONTROLLED  . Early cataracts, bilateral   . Essential hypertension   . Right bundle branch block (RBBB)   . Traumatic arthropathy of ankle and foot   . Wears glasses     Past Surgical History:  Procedure Laterality Date  . AMPUTATION Left 10/13/2018   Procedure: LEFT BELOW KNEE AMPUTATION;  Surgeon: Newt Minion, MD;  Location: [facility];  Service: Orthopedics;  Laterality: Left;  . COLONOSCOPY    . ELBOW SURGERY Right 1966  . FOOT ARTHRODESIS, SUBTALAR Right 2019  . TONSILLECTOMY    . TOTAL ANKLE REPLACEMENT    . ULNAR NERVE TRANSPOSITION Right 09/05/2015   Procedure: ULNAR NERVE DECOMPRESSION/TRANSPOSITION;  Surgeon: Christophe Louis, MD;  Location: [facility] ORS;  Service: Orthopedics;  Laterality: Right;    There were no vitals filed for this visit.  Subjective Assessment - 02/13/19 1303    Subjective  Reports the left leg feels much better, back to the cane 100%. Still gets sore with increased activity.    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  No/denies    Pain Score  0-No pain           [facility] Adult PT Treatment/Exercise - 02/13/19 1305      Transfers   Transfers  Sit to Stand;Stand to Sit    Sit to Stand  5: Supervision;With upper extremity assist;From chair/3-in-1    Stand to Sit  5: Supervision;With upper extremity assist;To chair/3-in-1      Ambulation/Gait   Ambulation/Gait  Yes    Ambulation/Gait Assistance  5: Supervision;4: Min guard    Ambulation/Gait Assistance Details  cues for posture with gait. no balance loss or issues noted with gait over outdoor compliant surfaces.    Ambulation Distance (Feet)  240 Feet   x1 in/outdoors, around gym with activities   Assistive device  Straight cane;Prosthesis    Gait Pattern  Step-through pattern;Decreased step length - right;Decreased stance time - left;Decreased hip/knee flexion - left;Decreased weight shift to left;Left circumduction;Left hip hike;Left foot flat;Left flexed knee in stance;Antalgic;Lateral hip instability;Trunk flexed;Abducted - left    Ambulation Surface  Unlevel;Level;Indoor;Outdoor;Paved;Gravel;Grass  High Level Balance   High Level Balance Activities  Side stepping;Marching forwards;Marching backwards;Tandem walking;Negotiating over obstacles   tandem gait fwd/bwd   High Level Balance Comments  on blue mat in parallel bars: 3 laps each with light to no UE support on bars, cues on form, technique and weight shifting. min guard to min assist for balance; on floor with cane/prosthesis-  fwd stepping over bolsters of varied heights for 2 laps, then lateral stepping over bolsters of vaired heights for 1 lap toward each side. min guard to min assist for balance.       Neuro Re-ed    Neuro Re-ed Details   for  balance/NMR: 4 square stepping with cane/prosthesis. cues on increased step length to clear bands and for weight shifting. performed 5 laps each direction with min guard assist.; had pt work on picking up scattered bean bags from floor and toss to basket to simulate picking up yard/household things with cane/prosthesis, min guard assist. progressed this to have bags on blue and red mats with bolsters scattered around or over them to more simulate outdoor surfaces, min guard assist needed with use of cane/prosthesis.       Prosthetics   Current prosthetic wear tolerance (days/week)   daily    Current prosthetic wear tolerance (#hours/day)   all awake hours    Residual limb condition   intact per patient    Education Provided  Skin check;Residual limb care;Correct ply sock adjustment;Proper wear schedule/adjustment;Proper weight-bearing schedule/adjustment    Person(s) Educated  Patient    Education Method  Explanation;Demonstration;Verbal cues    Education Method  Verbalized understanding;Returned demonstration;Verbal cues required;Needs further instruction    Donning Prosthesis  Modified independent (device/increased time)    Doffing Prosthesis  Modified independent (device/increased time)               PT Short Term Goals - 02/06/19 1352      PT SHORT TERM GOAL #1   Title  Patient verbalizes residual limb pain management including modifying assistive device.  (All STGs Target Date: 03/02/2019)    Time  1    Status  New    Target Date  03/02/19      PT SHORT TERM GOAL #2   Title  Patient tolerates prosthesis wear >14hrs total per day without limb pain 0/10 sitting & <3/10 standing.    Time  1    Period  Months    Status  New    Target Date  03/02/19      PT SHORT TERM GOAL #3   Title  Berg Balance >/=  41/56    Time  1    Period  Months    Status  Revised    Target Date  03/02/19      PT SHORT TERM GOAL #4   Title  Patient ambulates 300' with cane & prosthesis with  supervision.    Time  1    Period  Months    Status  Revised    Target Date  03/02/19      PT SHORT TERM GOAL #5   Title  Patient negotiates stairs with single rail, ramps & curbs with cane & prosthesis with supervision.    Time  1    Period  Months    Status  Revised    Target Date  03/02/19        PT Long Term Goals - 01/09/19 1816      PT LONG TERM GOAL #1  Title  Patient verbalizes & demonstrates proper prosthetic care to enable safe use of prosthesis. (All LTGs Target Date: 04/04/2019)    Time  3    Period  Months    Status  On-going    Target Date  04/04/19      PT LONG TERM GOAL #2   Title  Patient tolerates prosthesis wear >90% of awake hours without skin issues or limb pain to enable function throughout his day.      Time  3    Period  Months    Status  On-going    Target Date  04/04/19      PT LONG TERM GOAL #3   Title  Berg Balance >45/56 to indicate lower fall risk.     Time  3    Period  Months    Status  On-going    Target Date  04/04/19      PT LONG TERM GOAL #4   Title  Patient ambulates 500' outdoors including grass with LRAD & Prosthesis modified independent.     Time  3    Period  Months    Status  On-going    Target Date  04/04/19      PT LONG TERM GOAL #5   Title  Patient negotiates stairs, ramps & curbs with LRAD & prosthesis modified independent to enable community access.     Time  3    Period  Months    Status  On-going    Target Date  04/04/19            Plan - 02/13/19 1305    Clinical Impression Statement  Today's skilled session continued to focus on gait on various surfaces with cane/prosthesis and balance reactions with no issues reported or noted with session. No increase in pain with today's activities. The pt is progressing toward goals and should benefit from continued PT to progress toward unmet goals.    Personal Factors and Comorbidities  Age;Comorbidity 3+;Past/Current Experience;Time since onset of  injury/illness/exacerbation    Comorbidities  L TTA, DM2, neuropathy, HTN, RBBB,     Examination-Activity Limitations  Carry;Lift;Locomotion Level;Squat;Stairs;Stand;Transfers    Examination-Participation Restrictions  Community Activity;Driving;Yard Work    Conservation officer, historic buildingstability/Clinical Decision Making  Evolving/Moderate complexity    Rehab Potential  Good    PT Frequency  2x / week    PT Duration  12 weeks    PT Treatment/Interventions  ADLs/Self Care Home Management;DME Instruction;Gait training;Stair training;Functional mobility training;Therapeutic activities;Therapeutic exercise;Balance training;Neuromuscular re-education;Patient/family education;Prosthetic Training    PT Next Visit Plan  work towards updated STGs; check limb & pain. standing balance and prosthetic gait    Consulted and Agree with Plan of Care  Patient       Patient will benefit from skilled therapeutic intervention in order to improve the following deficits and impairments:  Abnormal gait, Decreased activity tolerance, Decreased balance, Decreased endurance, Decreased knowledge of use of DME, Decreased mobility, Decreased skin integrity, Decreased strength, Postural dysfunction, Prosthetic Dependency, Pain  Visit Diagnosis: 1. Other abnormalities of gait and mobility   2. Muscle weakness (generalized)   3. Unsteadiness on feet   4. Abnormal posture        Problem List Patient Active Problem List   Diagnosis Date Noted  . S/P BKA (below knee amputation), left (HCC) 10/13/2018  . Charcot foot due to diabetes mellitus (HCC) 01/26/2018  . PE (physical exam), annual 12/29/2016  . Neuropathy 12/29/2016  . BPH (benign prostatic hyperplasia) 12/29/2016  . Advanced  care planning/counseling discussion 12/29/2016  . DM type 2 with diabetic peripheral neuropathy (HCC) 06/30/2016  . Anemia 07/28/2015  . Essential hypertension    Sallyanne KusterKathy Bury, PTA, [facility] [address], [address] [place], [place]  [address] [phone] 02/13/19, 2:29 PM   Name: Daniel Leon MRN: [id number] Date of Birth: 04/19/1951

## 2019-02-14 ENCOUNTER — Other Ambulatory Visit (INDEPENDENT_AMBULATORY_CARE_PROVIDER_SITE_OTHER): Payer: Self-pay | Admitting: Orthopedic Surgery

## 2019-02-15 ENCOUNTER — Encounter: Payer: Self-pay | Admitting: Physical Therapy

## 2019-02-15 ENCOUNTER — Other Ambulatory Visit: Payer: Self-pay

## 2019-02-15 ENCOUNTER — Ambulatory Visit: Payer: Medicare Other | Admitting: Physical Therapy

## 2019-02-15 DIAGNOSIS — R2681 Unsteadiness on feet: Secondary | ICD-10-CM

## 2019-02-15 DIAGNOSIS — R2689 Other abnormalities of gait and mobility: Secondary | ICD-10-CM

## 2019-02-15 DIAGNOSIS — R293 Abnormal posture: Secondary | ICD-10-CM | POA: Diagnosis not present

## 2019-02-15 DIAGNOSIS — M6281 Muscle weakness (generalized): Secondary | ICD-10-CM

## 2019-02-19 NOTE — Therapy (Signed)
[facility] [address] [place], [place], [address] Phone: [phone]   Fax:  [phone]  Physical Therapy Treatment  Patient Details  Name: Daniel Leon MRN: [id number] Date of Birth: May 09, 1951 Referring Provider (PT): Barnie DelErin Zamora, NP   Encounter Date: 02/15/2019   CLINIC OPERATION CHANGES: Outpatient Neuro Rehab is open at lower capacity following universal masking, social distancing, and patient screening.     02/15/19 1602  PT Visits / Re-Eval  Visit Number 11  Number of Visits 25  Date for PT Re-Evaluation 04/04/19  Authorization  Authorization Type [employer] Medicare & Medicaid  PT Time Calculation  PT Start Time 1600  PT Stop Time 1639  PT Time Calculation (min) 39 min  PT - End of Session  Equipment Utilized During Treatment Gait belt  Activity Tolerance Patient tolerated treatment well  Behavior During Therapy WFL for tasks assessed/performed    Past Medical History:  Diagnosis Date  . 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 Charcot ankle, left    collapse  . Charcot's joint of foot   . Diabetes mellitus without complication (HCC)    DIET-CONTROLLED  . Early cataracts, bilateral   . Essential hypertension   . Right bundle branch block (RBBB)   . Traumatic arthropathy of ankle and foot   . Wears glasses     Past Surgical History:  Procedure Laterality Date  . AMPUTATION Left 10/13/2018   Procedure: LEFT BELOW KNEE AMPUTATION;  Surgeon: Nadara Mustarduda, Marcus V, MD;  [facility];  Service: Orthopedics;  Laterality: Left;  . COLONOSCOPY    . ELBOW SURGERY Right 1966  . FOOT ARTHRODESIS, SUBTALAR Right 2019  . TONSILLECTOMY    . TOTAL ANKLE REPLACEMENT    . ULNAR NERVE TRANSPOSITION Right 09/05/2015   Procedure: ULNAR NERVE DECOMPRESSION/TRANSPOSITION;  Surgeon: Myra Rudehristopher Smith, MD;  Location: [facility] ORS;  Service: Orthopedics;  Laterality: Right;    There were no vitals filed  for this visit.     02/15/19 1602  Symptoms/Limitations  Subjective Just left from seeing Bobby, had softer pads added and height adjusted down. No falls or other issues to report.  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 No/denies  Pain Score 0      02/15/19 1603  Transfers  Transfers Sit to Stand;Stand to Sit  Sit to Stand 5: Supervision;With upper extremity assist;From chair/3-in-1  Stand to Sit 5: Supervision;With upper extremity assist;To chair/3-in-1  Ambulation/Gait  Ambulation/Gait Yes  Ambulation/Gait Assistance 5: Supervision;4: Min guard  Ambulation/Gait Assistance Details cues on posture and for increased base of support with gait  Ambulation Distance (Feet) 500 Feet (x1, plus around gym)  Assistive device Straight cane;Prosthesis  Gait Pattern Step-through pattern;Decreased step length - right;Decreased stance time - left;Decreased hip/knee flexion - left;Decreased weight shift to left;Left circumduction;Left hip hike;Left foot flat;Left flexed knee in stance;Antalgic;Lateral hip instability;Trunk flexed;Abducted - left  Ambulation Surface Level;Unlevel;Indoor;Outdoor;Paved;Gravel;Grass  Stairs Yes  Stairs Assistance 5: Supervision;4: Min guard  Stairs Assistance Details (indicate cue type and reason) 1 rep step to pattern; remainder worked on reciprocal pattern  Stair Management Technique Two rails;Step to pattern;Alternating pattern;Forwards  Number of Stairs 4 (x4 reps)  Height of Stairs 6  Neuro Re-ed   Neuro Re-ed Details  for strengthening/balance: sit<>stands with feet across red  foam beam. cues for full upright posture and slow, controlled descent.   Prosthetics  Current prosthetic wear tolerance (days/week)  daily  Current prosthetic wear tolerance  (#hours/day)  all awake hours  Residual limb condition  intact per patient  Donning Prosthesis 6  Doffing Prosthesis 6      02/15/19 1610  Balance Exercises: Standing  Standing Eyes Closed Wide (BOA);Head turns;Foam/compliant surface;Other reps (comment);30 secs;Limitations  SLS with Vectors Foam/compliant surface;Other reps (comment);Limitations  Stepping Strategy Anterior;Posterior;Lateral (4 square stepping x 3 laps each way no AD, min guard assist)  Balance Exercises: Standing  Standing Eyes Closed Limitations on airex with feet hip width apart no UE support: EC no head movements, progressing to EO for head movements left<>right, then up<>down. min guard to min assist for balance with cues on posture and weight shifting for balance.  SLS with Vectors Limitations on blue mat- forward foot taps to cones with side stepping for 1 lap each way, no UE support, min guard assist.           PT Short Term Goals - 02/06/19 1352      PT SHORT TERM GOAL #1   Title  Patient verbalizes residual limb pain management including modifying assistive device.  (All STGs Target Date: 03/02/2019)    Time  1    Status  New    Target Date  03/02/19      PT SHORT TERM GOAL #2   Title  Patient tolerates prosthesis wear >14hrs total per day without limb pain 0/10 sitting & <3/10 standing.    Time  1    Period  Months    Status  New    Target Date  03/02/19      PT SHORT TERM GOAL #3   Title  Berg Balance >/=  41/56    Time  1    Period  Months    Status  Revised    Target Date  03/02/19      PT SHORT TERM GOAL #4   Title  Patient ambulates 300' with cane & prosthesis with supervision.    Time  1    Period  Months    Status  Revised    Target Date  03/02/19      PT SHORT TERM GOAL #5   Title  Patient negotiates stairs with single rail, ramps & curbs with cane & prosthesis with supervision.    Time  1    Period  Months    Status  Revised    Target Date  03/02/19        PT Long Term  Goals - 01/09/19 1816      PT LONG TERM GOAL #1   Title  Patient verbalizes & demonstrates proper prosthetic care to enable safe use of prosthesis. (All LTGs Target Date: 04/04/2019)    Time  3    Period  Months    Status  On-going    Target Date  04/04/19      PT LONG TERM GOAL #2   Title  Patient tolerates prosthesis wear >90% of awake hours without skin issues or limb pain to enable function throughout his day.      Time  3    Period  Months    Status  On-going    Target Date  04/04/19      PT LONG TERM GOAL #3   Title  Berg Balance >45/56 to indicate lower fall risk.     Time  3  Period  Months    Status  On-going    Target Date  04/04/19      PT LONG TERM GOAL #4   Title  Patient ambulates 500' outdoors including grass with LRAD & Prosthesis modified independent.     Time  3    Period  Months    Status  On-going    Target Date  04/04/19      PT LONG TERM GOAL #5   Title  Patient negotiates stairs, ramps & curbs with LRAD & prosthesis modified independent to enable community access.     Time  3    Period  Months    Status  On-going    Target Date  04/04/19         02/15/19 1602  Plan  Clinical Impression Statement Today's skilled session focused on gait on various surfaces and balance reactions with no issues reported/noted in session. The pt is progressing toward goals and should benefit from continued PT to progress toward unmet goals.  Personal Factors and Comorbidities Age;Comorbidity 3+;Past/Current Experience;Time since onset of injury/illness/exacerbation  Comorbidities L TTA, DM2, neuropathy, HTN, RBBB,   Examination-Activity Limitations Carry;Lift;Locomotion Level;Squat;Stairs;Stand;Transfers  Examination-Participation Restrictions Community Activity;Driving;Yard Work  Pt will benefit from skilled therapeutic intervention in order to improve on the following deficits Abnormal gait;Decreased activity tolerance;Decreased balance;Decreased endurance;Decreased  knowledge of use of DME;Decreased mobility;Decreased skin integrity;Decreased strength;Postural dysfunction;Prosthetic Dependency;Pain  Stability/Clinical Decision Making Evolving/Moderate complexity  Rehab Potential Good  PT Frequency 2x / week  PT Duration 12 weeks  PT Treatment/Interventions ADLs/Self Care Home Management;DME Instruction;Gait training;Stair training;Functional mobility training;Therapeutic activities;Therapeutic exercise;Balance training;Neuromuscular re-education;Patient/family education;Prosthetic Training  PT Next Visit Plan work towards updated STGs;  standing balance and prosthetic gait  Consulted and Agree with Plan of Care Patient          Patient will benefit from skilled therapeutic intervention in order to improve the following deficits and impairments:  Abnormal gait, Decreased activity tolerance, Decreased balance, Decreased endurance, Decreased knowledge of use of DME, Decreased mobility, Decreased skin integrity, Decreased strength, Postural dysfunction, Prosthetic Dependency, Pain  Visit Diagnosis: 1. Other abnormalities of gait and mobility   2. Muscle weakness (generalized)   3. Unsteadiness on feet        Problem List Patient Active Problem List   Diagnosis Date Noted  . S/P BKA (below knee amputation), left ([place]) 10/13/2018  . Charcot foot due to diabetes mellitus ([place]) 01/26/2018  . PE (physical exam), annual 12/29/2016  . Neuropathy 12/29/2016  . BPH (benign prostatic hyperplasia) 12/29/2016  . Advanced care planning/counseling discussion 12/29/2016  . DM type 2 with diabetic peripheral neuropathy ([place]) 06/30/2016  . Anemia 07/28/2015  . Essential hypertension     Willow Ora, PTA, [facility] [address] [place],  [phone] 02/19/19, 9:18 AM   Name: Daniel Leon MRN: [id number] Date of Birth: 12-18-1950

## 2019-02-20 ENCOUNTER — Other Ambulatory Visit: Payer: Self-pay

## 2019-02-20 ENCOUNTER — Ambulatory Visit: Payer: Medicare Other | Admitting: Physical Therapy

## 2019-02-20 ENCOUNTER — Encounter: Payer: Self-pay | Admitting: Physical Therapy

## 2019-02-20 DIAGNOSIS — R293 Abnormal posture: Secondary | ICD-10-CM | POA: Diagnosis not present

## 2019-02-20 DIAGNOSIS — R2689 Other abnormalities of gait and mobility: Secondary | ICD-10-CM

## 2019-02-20 DIAGNOSIS — M6281 Muscle weakness (generalized): Secondary | ICD-10-CM

## 2019-02-20 DIAGNOSIS — R2681 Unsteadiness on feet: Secondary | ICD-10-CM

## 2019-02-20 NOTE — Therapy (Signed)
[facility] [address] [place], [place], [address] Phone: [phone]   Fax:  [phone]  Physical Therapy Treatment  Patient Details  Name: Daniel Leon MRN: [id number] Date of Birth: [birth year] Referring Provider (PT): Barnie DelErin Zamora, NP   Encounter Date: 02/20/2019   CLINIC OPERATION CHANGES: Outpatient Neuro Rehab is open at lower capacity following universal masking, social distancing, and patient screening.  The patient's COVID risk of complications score is 5.   PT End of Session - 02/20/19 1350    Visit Number  12    Number of Visits  25    Date for PT Re-Evaluation  04/04/19    Authorization Type  [employer]HC Medicare & Medicaid    PT Start Time  1257    PT Stop Time  1345    PT Time Calculation (min)  48 min    Equipment Utilized During Treatment  Gait belt    Activity Tolerance  Patient tolerated treatment well    Behavior During Therapy  WFL for tasks assessed/performed       Past Medical History:  Diagnosis Date  . 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 Charcot ankle, left    collapse  . Charcot's joint of foot   . Diabetes mellitus without complication (HCC)    DIET-CONTROLLED  . Early cataracts, bilateral   . Essential hypertension   . Right bundle branch block (RBBB)   . Traumatic arthropathy of ankle and foot   . Wears glasses     Past Surgical History:  Procedure Laterality Date  . AMPUTATION Left 10/13/2018   Procedure: LEFT BELOW KNEE AMPUTATION;  Surgeon: Nadara Mustarduda, Marcus V, MD;  [facility];  Service: Orthopedics;  Laterality: Left;  . COLONOSCOPY    . ELBOW SURGERY Right 1966  . FOOT ARTHRODESIS, SUBTALAR Right 2019  . TONSILLECTOMY    . TOTAL ANKLE REPLACEMENT    . ULNAR NERVE TRANSPOSITION Right 09/05/2015   Procedure: ULNAR NERVE DECOMPRESSION/TRANSPOSITION;  Surgeon: Myra Rudehristopher Smith, MD;  Location: [facility] ORS;  Service: Orthopedics;  Laterality: Right;     There were no vitals filed for this visit.  Subjective Assessment - 02/20/19 1256    Subjective  The new pads have helped but the outside one seems too thick.    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  No/denies                       [facility]/Exercise - 02/20/19 1300      Transfers   Transfers  Sit to Stand;Stand to Sit    Sit to Stand  6: Modified independent (Device/Increase time);With upper extremity assist;From chair/3-in-1    Stand to Sit  6: Modified independent (Device/Increase time);With upper extremity assist;To chair/3-in-1      Ambulation/Gait   Ambulation/Gait  Yes    Ambulation/Gait Assistance  5: Supervision    Ambulation/Gait Assistance Details  worked on gait without device scanning environment & negotiating around furniture, turning right & left    Ambulation Distance (Feet)  500 Feet    Assistive device  Prosthesis;None   arrived /exited with cane, but no device during session   Gait Pattern  Step-through pattern;Decreased step length - right;Decreased stance  time - left;Decreased hip/knee flexion - left;Decreased weight shift to left;Left circumduction;Left hip hike;Left foot flat;Left flexed knee in stance;Antalgic;Lateral hip instability;Trunk flexed;Abducted - left    Ambulation Surface  Indoor;Level    Stairs  Yes    Stairs Assistance  5: Supervision;4: Min guard    Stair Management Technique  Two rails;Step to pattern;Alternating pattern;Forwards    Number of Stairs  4   x4 reps   Height of Stairs  6      High Level Balance   High Level Balance Activities  Side stepping;Braiding;Backward walking;Direction changes;Head turns;Marching forwards;Negotitating around obstacles   prosthesis only   High Level Balance Comments   verbal, demo & tactile cues on technique      Neuro Re-ed    Neuro Re-ed Details   --      Prosthetics   Prosthetic Care Comments   PT instructed with demo & verbal cues in use of cutoff sock distally over liner & proximally under liner,  changing shoes with prosthesis and use of vacuum seal bag for water activities (possible use of old socket with socket revision in few months as water leg)    Current prosthetic wear tolerance (days/week)   daily    Current prosthetic wear tolerance (#hours/day)   all awake hours    Residual limb condition   intact, normal color & temperature.     Education Provided  Skin check;Correct ply sock adjustment;Other (comment)   see prosthetic care comments   Person(s) Educated  Patient    Education Method  Explanation;Demonstration;Verbal cues    Education Method  Verbalized understanding;Needs further instruction    Donning Prosthesis  Modified independent (device/increased time)    Doffing Prosthesis  Modified independent (device/increased time)               PT Short Term Goals - 02/06/19 1352      PT SHORT TERM GOAL #1   Title  Patient verbalizes residual limb pain management including modifying assistive device.  (All STGs Target Date: 03/02/2019)    Time  1    Status  New    Target Date  03/02/19      PT SHORT TERM GOAL #2   Title  Patient tolerates prosthesis wear >14hrs total per day without limb pain 0/10 sitting & <3/10 standing.    Time  1    Period  Months    Status  New    Target Date  03/02/19      PT SHORT TERM GOAL #3   Title  Berg Balance >/=  41/56    Time  1    Period  Months    Status  Revised    Target Date  03/02/19      PT SHORT TERM GOAL #4   Title  Patient ambulates 300' with cane & prosthesis with supervision.    Time  1    Period  Months    Status  Revised    Target Date  03/02/19      PT SHORT TERM GOAL #5   Title  Patient negotiates stairs with single rail, ramps & curbs with cane & prosthesis with  supervision.    Time  1    Period  Months    Status  Revised    Target Date  03/02/19        PT Long Term Goals - 01/09/19 1816      PT LONG TERM GOAL #1   Title  Patient verbalizes &  demonstrates proper prosthetic care to enable safe use of prosthesis. (All LTGs Target Date: 04/04/2019)    Time  3    Period  Months    Status  On-going    Target Date  04/04/19      PT LONG TERM GOAL #2   Title  Patient tolerates prosthesis wear >90% of awake hours without skin issues or limb pain to enable function throughout his day.      Time  3    Period  Months    Status  On-going    Target Date  04/04/19      PT LONG TERM GOAL #3   Title  Berg Balance >45/56 to indicate lower fall risk.     Time  3    Period  Months    Status  On-going    Target Date  04/04/19      PT LONG TERM GOAL #4   Title  Patient ambulates 500' outdoors including grass with LRAD & Prosthesis modified independent.     Time  3    Period  Months    Status  On-going    Target Date  04/04/19      PT LONG TERM GOAL #5   Title  Patient negotiates stairs, ramps & curbs with LRAD & prosthesis modified independent to enable community access.     Time  3    Period  Months    Status  On-going    Target Date  04/04/19            Plan - 02/20/19 2328    Clinical Impression Statement  Today's session focused on prosthetic gait without assistive device including scanning & advanced balance/movements in various directions.  PT educated pt on use of cutoff socks, changing shoes & water activities with prosthesis.    Personal Factors and Comorbidities  Age;Comorbidity 3+;Past/Current Experience;Time since onset of injury/illness/exacerbation    Comorbidities  L TTA, DM2, neuropathy, HTN, RBBB,     Examination-Activity Limitations  Carry;Lift;Locomotion Level;Squat;Stairs;Stand;Transfers    Examination-Participation Restrictions  Community Activity;Driving;Yard Work    Conservation officer, historic buildingstability/Clinical Decision Making   Evolving/Moderate complexity    Rehab Potential  Good    PT Frequency  2x / week    PT Duration  12 weeks    PT Treatment/Interventions  ADLs/Self Care Home Management;DME Instruction;Gait training;Stair training;Functional mobility training;Therapeutic activities;Therapeutic exercise;Balance training;Neuromuscular re-education;Patient/family education;Prosthetic Training    PT Next Visit Plan  work towards updated STGs;  standing balance and prosthetic gait    Consulted and Agree with Plan of Care  Patient       Patient will benefit from skilled therapeutic intervention in order to improve the following deficits and impairments:  Abnormal gait, Decreased activity tolerance, Decreased balance, Decreased endurance, Decreased knowledge of use of DME, Decreased mobility, Decreased skin integrity, Decreased strength, Postural dysfunction, Prosthetic Dependency, Pain  Visit Diagnosis: 1. Other abnormalities of gait and mobility   2. Muscle weakness (generalized)   3. Unsteadiness on feet   4. Abnormal posture        Problem List Patient Active Problem List   Diagnosis Date Noted  . S/P BKA (below knee amputation), left ([facility]) 10/13/2018  . Charcot foot due to diabetes mellitus ([facility]) 01/26/2018  . PE (physical exam), annual 12/29/2016  . Neuropathy 12/29/2016  . BPH (benign prostatic hyperplasia) 12/29/2016  . Advanced care planning/counseling discussion 12/29/2016  . DM type 2 with diabetic peripheral neuropathy ([facility]) 06/30/2016  . Anemia 07/28/2015  . Essential hypertension  Vladimir FasterWALDRON,Rebie Peale PT, DPT 02/20/2019, 11:30 PM  [facility] [address] [place], [place], [address] Phone: [phone]   Fax:  [phone]  Name: Daniel Leon MRN: [id number] Date of Birth: Dec 15, 1950

## 2019-02-21 HISTORY — PX: EYE SURGERY: SHX253

## 2019-02-22 ENCOUNTER — Ambulatory Visit: Payer: Medicare Other | Admitting: Physical Therapy

## 2019-02-27 ENCOUNTER — Ambulatory Visit: Payer: Medicare Other | Attending: Family | Admitting: Physical Therapy

## 2019-02-27 ENCOUNTER — Other Ambulatory Visit: Payer: Self-pay

## 2019-02-27 ENCOUNTER — Encounter: Payer: Self-pay | Admitting: Physical Therapy

## 2019-02-27 DIAGNOSIS — R2681 Unsteadiness on feet: Secondary | ICD-10-CM

## 2019-02-27 DIAGNOSIS — R293 Abnormal posture: Secondary | ICD-10-CM | POA: Diagnosis present

## 2019-02-27 DIAGNOSIS — M6281 Muscle weakness (generalized): Secondary | ICD-10-CM

## 2019-02-27 DIAGNOSIS — R2689 Other abnormalities of gait and mobility: Secondary | ICD-10-CM | POA: Diagnosis not present

## 2019-02-28 NOTE — Therapy (Signed)
[facility] [address] [place], [place], [address] Phone: [phone]   Fax:  [phone]  Physical Therapy Treatment  Patient Details  Name: Daniel Leon MRN: [id number] Date of Birth: Jun 07, 1951 Referring Provider (PT): Dondra Prader, NP   Encounter Date: 02/27/2019   CLINIC OPERATION CHANGES: Outpatient [facility] is open at lower capacity following universal masking, social distancing, and patient screening.  The patient's COVID risk of complications score is 5.   PT End of Session - 02/27/19 1351    Visit Number  13    Number of Visits  25    Date for PT Re-Evaluation  04/04/19    Authorization Type  [employer] Medicare & Medicaid    PT Start Time  1300    PT Stop Time  1345    PT Time Calculation (min)  45 min    Equipment Utilized During Treatment  Gait belt    Activity Tolerance  Patient tolerated treatment well    Behavior During Therapy  WFL for tasks assessed/performed       Past Medical History:  Diagnosis Date  . 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 Charcot ankle, left    collapse  . Charcot's joint of foot   . Diabetes mellitus without complication (HCC)    DIET-CONTROLLED  . Early cataracts, bilateral   . Essential hypertension   . Right bundle branch block (RBBB)   . Traumatic arthropathy of ankle and foot   . Wears glasses     Past Surgical History:  Procedure Laterality Date  . AMPUTATION Left 10/13/2018   Procedure: LEFT BELOW KNEE AMPUTATION;  Surgeon: Newt Minion, MD;  Location: [facility];  Service: Orthopedics;  Laterality: Left;  . COLONOSCOPY    . ELBOW SURGERY Right 1966  . FOOT ARTHRODESIS, SUBTALAR Right 2019  . TONSILLECTOMY    . TOTAL ANKLE REPLACEMENT    . ULNAR NERVE TRANSPOSITION Right 09/05/2015   Procedure: ULNAR NERVE DECOMPRESSION/TRANSPOSITION;  Surgeon: Christophe Louis, MD;  Location: [facility];  Service: Orthopedics;  Laterality: Right;     There were no vitals filed for this visit.  Subjective Assessment - 02/27/19 1300    Subjective  He is wearing prosthesis all awake hours. He has been adding socks due to shrinkage. The height adjustment seems like maybe too much a little.    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         [facility] PT Assessment - 02/27/19 1300      Berg Balance Test   Sit to Stand  Able to stand without using hands and stabilize independently    Standing Unsupported  Able to stand safely 2 minutes    Sitting with Back Unsupported but Feet Supported on Floor or Stool  Able to sit safely and securely 2 minutes    Stand to Sit  Sits safely with minimal use of hands    Transfers  Able to transfer safely, minor use of hands    Standing Unsupported with Eyes Closed  Able to stand 10 seconds safely    Standing Unsupported with Feet Together  Able to place feet together independently and stand 1 minute safely    From Standing, Reach Forward with Outstretched Arm  Can reach confidently >25 cm (10")  From Standing Position, Pick up Object from [place] to pick up shoe safely and easily    From Standing Position, Turn to Look Behind Over each Shoulder  Looks behind from both sides and weight shifts well    Turn 360 Degrees  Able to turn 360 degrees safely in 4 seconds or less    Standing Unsupported, Alternately Place Feet on Step/Stool  Able to complete 4 steps without aid or supervision    Standing Unsupported, One Foot in [place] to plae foot ahead of the other independently and hold 30 seconds    Standing on One Leg  Able to lift leg independently and hold > 10 seconds    Total Score  53                   [facility] Adult PT Treatment/Exercise - 02/27/19 1300      Transfers   Transfers  Sit to Stand;Stand to  Sit    Sit to Stand  6: Modified independent (Device/Increase time);With upper extremity assist;From chair/3-in-1    Stand to Sit  6: Modified independent (Device/Increase time);With upper extremity assist;To chair/3-in-1      Ambulation/Gait   Ambulation/Gait  Yes    Ambulation/Gait Assistance  5: Supervision    Ambulation Distance (Feet)  500 Feet   500' X 2   Assistive device  Prosthesis;None   arrived /exited with cane, but no device during session   Gait Pattern  Step-through pattern;Decreased step length - right;Decreased stance time - left;Decreased hip/knee flexion - left;Decreased weight shift to left;Left circumduction;Left hip hike;Left foot flat;Left flexed knee in stance;Antalgic;Lateral hip instability;Trunk flexed;Abducted - left    Ambulation Surface  Indoor;Level;Outdoor;Paved;Grass    Stairs  Yes    Stairs Assistance  5: Supervision;4: Min guard    Stair Management Technique  One rail Right;Alternating pattern;Forwards    Number of Stairs  4    Height of Stairs  6    Ramp  5: Supervision   prosthesis only   Curb  5: Supervision   prosthesis only     High Level Balance   High Level Balance Activities  --   prosthesis only     Knee/Hip Exercises: Aerobic   Elliptical  level 2 for 2.5 minutes. PT demo safe getting on / off.  Pt reports prosthetic knee "pinching"  PT explained that elliptical can transfer motion to knee / limb due to stiff prosthetic ankle and typically is not most comfortable form of cardio workout    Tread Mill  PT demo & instructed in safety with mounting/dismounting, stepping on/off belt, safety lanyard and determining speed.  2.3 mph for 5 minutes initially with BUE light support on rails, progressed to single UE support safely.  When no UE support, did not appear safe.        Knee/Hip Exercises: Machines for Strengthening   Cybex Leg Press  100# 15 reps  PT instructed in LE weight machine use with TTA prosthesis      Prosthetics   Prosthetic  Care Comments   PT instructed how ply socks can make prosthesis seem high with too many & low with too few.  Increasing activity level can pump fluid from limb.    Current prosthetic wear tolerance (days/week)   daily    Current prosthetic wear tolerance (#hours/day)   all awake hours    Residual limb condition   intact, normal color & temperature.     Education Provided  Skin  check;Correct ply sock adjustment;Other (comment)   see prosthetic care comments   Person(s) Educated  Patient    Education Method  Explanation;Verbal cues    Education Method  Verbalized understanding;Verbal cues required    Donning Prosthesis  Modified independent (device/increased time)    Doffing Prosthesis  Modified independent (device/increased time)             PT Education - 02/27/19 1330    Education Details  Walking program with cane progressively increasing distance.  UE weight machine use with TTA prosthesis & with Rt elbow limitations    Person(s) Educated  Patient    Methods  Explanation;Demonstration;Verbal cues    Comprehension  Verbalized understanding;Returned demonstration       PT Short Term Goals - 02/27/19 1850      PT SHORT TERM GOAL #1   Title  Patient verbalizes residual limb pain management including modifying assistive device.  (All STGs Target Date: 03/02/2019)    Baseline  MET 02/27/2019    Time  1    Status  Achieved    Target Date  03/02/19      PT SHORT TERM GOAL #2   Title  Patient tolerates prosthesis wear >14hrs total per day without limb pain 0/10 sitting & <3/10 standing.    Baseline  MET 02/27/2019    Time  1    Period  Months    Status  Achieved    Target Date  03/02/19      PT SHORT TERM GOAL #3   Title  Berg Balance >/=  41/56    Baseline  MET 02/27/2019  Berg 53/56    Time  1    Period  Months    Status  Achieved    Target Date  03/02/19      PT SHORT TERM GOAL #4   Title  Patient ambulates 300' with cane & prosthesis with supervision.    Baseline  MET  02/27/2019    Time  1    Period  Months    Status  Achieved    Target Date  03/02/19      PT SHORT TERM GOAL #5   Title  Patient negotiates stairs with single rail, ramps & curbs with cane & prosthesis with supervision.    Baseline  MET 02/27/2019    Time  1    Period  Months    Status  Achieved    Target Date  03/02/19        PT Long Term Goals - 02/27/19 1853      PT LONG TERM GOAL #1   Title  Patient verbalizes & demonstrates proper prosthetic care to enable safe use of prosthesis. (All LTGs Target Date: 04/04/2019)    Time  3    Period  Months    Status  On-going    Target Date  04/04/19      PT LONG TERM GOAL #2   Title  Patient tolerates prosthesis wear >90% of awake hours without skin issues or limb pain to enable function throughout his day.      Time  3    Period  Months    Status  On-going    Target Date  04/04/19      PT LONG TERM GOAL #3   Title  Berg Balance >45/56 to indicate lower fall risk.     Baseline  MET 02/27/2019  Berg Balance 53/56    Time  3    Period  Months  Status  Achieved      PT LONG TERM GOAL #4   Title  Patient ambulates 500' outdoors including grass with LRAD & Prosthesis modified independent.     Time  3    Period  Months    Status  On-going    Target Date  04/04/19      PT LONG TERM GOAL #5   Title  Patient negotiates stairs, ramps & curbs with LRAD & prosthesis modified independent to enable community access.     Time  3    Period  Months    Status  On-going    Target Date  04/04/19            Plan - 02/27/19 1854    Clinical Impression Statement  PT instructed patient in use of exercise equipment. Pt is considering buying an elliptical that can be used seated or standing but may reconsider as motion bothers residual limb. He has better understanding for using weight machines & treadmill.  Pt met STGs.    Personal Factors and Comorbidities  Age;Comorbidity 3+;Past/Current Experience;Time since onset of  injury/illness/exacerbation    Comorbidities  L TTA, DM2, neuropathy, HTN, RBBB,     Examination-Activity Limitations  Carry;Lift;Locomotion Level;Squat;Stairs;Stand;Transfers    Examination-Participation Restrictions  Community Activity;Driving;Yard Work    [employer]chant [employer] officer  Evolving/Moderate complexity    Rehab Potential  Good    PT Frequency  2x / week    PT Duration  12 weeks    PT Treatment/Interventions  ADLs/Self Care Home Management;DME Instruction;Gait training;Stair training;Functional mobility training;Therapeutic activities;Therapeutic exercise;Balance training;Neuromuscular re-education;Patient/family education;Prosthetic Training    PT Next Visit Plan  work towards LTGs;  standing balance and prosthetic gait    Consulted and Agree with Plan of Care  Patient       Patient will benefit from skilled therapeutic intervention in order to improve the following deficits and impairments:  Abnormal gait, Decreased activity tolerance, Decreased balance, Decreased endurance, Decreased knowledge of use of DME, Decreased mobility, Decreased skin integrity, Decreased strength, Postural dysfunction, Prosthetic Dependency, Pain  Visit Diagnosis: 1. Other abnormalities of gait and mobility   2. Muscle weakness (generalized)   3. Unsteadiness on feet   4. Abnormal posture        Problem List Patient Active Problem List   Diagnosis Date Noted  . S/P BKA (below knee amputation), left ([facility]) 10/13/2018  . Charcot foot due to diabetes mellitus ([facility]) 01/26/2018  . PE (physical exam), annual 12/29/2016  . Neuropathy 12/29/2016  . BPH (benign prostatic hyperplasia) 12/29/2016  . Advanced care planning/counseling discussion 12/29/2016  . DM type 2 with diabetic peripheral neuropathy ([facility]) 06/30/2016  . Anemia 07/28/2015  . Essential hypertension     Daniel Leon  PT, DPT 02/28/2019, 8:58 AM  Daniel Leon [address] [place], [place], [address] Phone: [phone]   Fax:  [phone]  Name: Daniel Leon MRN: [id number] Date of Birth: 01-10-1951

## 2019-03-01 ENCOUNTER — Ambulatory Visit: Payer: Medicare Other | Admitting: Physical Therapy

## 2019-03-01 ENCOUNTER — Encounter: Payer: Self-pay | Admitting: Physical Therapy

## 2019-03-01 ENCOUNTER — Other Ambulatory Visit: Payer: Self-pay

## 2019-03-01 DIAGNOSIS — R2689 Other abnormalities of gait and mobility: Secondary | ICD-10-CM

## 2019-03-01 DIAGNOSIS — M6281 Muscle weakness (generalized): Secondary | ICD-10-CM

## 2019-03-01 DIAGNOSIS — R293 Abnormal posture: Secondary | ICD-10-CM

## 2019-03-01 DIAGNOSIS — R2681 Unsteadiness on feet: Secondary | ICD-10-CM

## 2019-03-02 NOTE — Therapy (Signed)
[facility] [address] [place], [place], [address] Phone: [phone]   Fax:  [phone]  Physical Therapy Treatment  Patient Details  Name: Daniel Leon MRN: [id number] Date of Birth: 09-15-1950 Referring Provider (PT): Dondra Prader, NP   Encounter Date: 03/01/2019   CLINIC OPERATION CHANGES: Outpatient [facility] is open at lower capacity following universal masking, social distancing, and patient screening.   PT End of Session - 03/01/19 1300    Visit Number  14    Number of Visits  25    Date for PT Re-Evaluation  04/04/19    Authorization Type  [employer]HC Medicare & Medicaid    PT Start Time  1300    PT Stop Time  1342    PT Time Calculation (min)  42 min    Equipment Utilized During Treatment  Gait belt    Activity Tolerance  Patient tolerated treatment well    Behavior During Therapy  WFL for tasks assessed/performed       Past Medical History:  Diagnosis Date  . 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 Charcot ankle, left    collapse  . Charcot's joint of foot   . Diabetes mellitus without complication (HCC)    DIET-CONTROLLED  . Early cataracts, bilateral   . Essential hypertension   . Right bundle branch block (RBBB)   . Traumatic arthropathy of ankle and foot   . Wears glasses     Past Surgical History:  Procedure Laterality Date  . AMPUTATION Left 10/13/2018   Procedure: LEFT BELOW KNEE AMPUTATION;  Surgeon: Newt Minion, MD;  Location: [facility];  Service: Orthopedics;  Laterality: Left;  . COLONOSCOPY    . ELBOW SURGERY Right 1966  . FOOT ARTHRODESIS, SUBTALAR Right 2019  . TONSILLECTOMY    . TOTAL ANKLE REPLACEMENT    . ULNAR NERVE TRANSPOSITION Right 09/05/2015   Procedure: ULNAR NERVE DECOMPRESSION/TRANSPOSITION;  Surgeon: Christophe Louis, MD;  Location: [facility];  Service: Orthopedics;  Laterality: Right;    There were no vitals filed for this visit.  Subjective  Assessment - 03/01/19 1259    Subjective  No falls or pain to report. Just left the eye doctor where his eyes got dialated, otherwise not new issues.    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  No/denies    Pain Score  0-No pain            [facility] Adult PT Treatment/Exercise - 03/01/19 1302      Transfers   Transfers  Sit to Stand;Stand to Sit;Floor to Transfer    Sit to Stand  6: Modified independent (Device/Increase time);With upper extremity assist;From chair/3-in-1    Stand to Sit  6: Modified independent (Device/Increase time);With upper extremity assist;To chair/3-in-1    Floor to Transfer  4: Min guard;5: Supervision    Floor to Transfer Details (indicate cue type and reason)  with UE support on external surface for 2 reps with supervision after demo by PTA. then 2 reps without external support for 2 reps after PTA demo'd technique.       Ambulation/Gait   Ambulation/Gait  Yes    Ambulation/Gait Assistance  5: Supervision;4: Min guard    Ambulation/Gait Assistance Details  min guard  assist with dynamic activities, otherwise supervision for gait with no balance issues noted    Ambulation Distance (Feet)  --   around gym no Ad   Assistive device  Prosthesis;None    Gait Pattern  Step-through pattern;Decreased step length - right;Decreased stance time - left;Decreased hip/knee flexion - left;Decreased weight shift to left;Left circumduction;Left hip hike;Left foot flat;Left flexed knee in stance;Antalgic;Lateral hip instability;Trunk flexed;Abducted - left    Ambulation Surface  Level;Indoor      Therapeutic Activites    Therapeutic Activities  Lifting;Other Therapeutic Activities    Lifting  20# crate on floor: lifting crate, carrying for 15-20 feet and setting on waist high  table, then lifitng off table and carrying back to floor next to mat table. 4 complete reps with min guard assist.     Other Therapeutic Activities  pt educated on pushing/pulling heavy loads. has pt push, then pull fully weighted cart for 15 feet x 2 with supervision. reminder cues on form and technique (PTA had demo'd prior to pt performance).       Neuro Re-ed    Neuro Re-ed Details   for balance/muscle re-ed: gait along ~50 foot hallway- forward gait with no AD with head movments left<>fwd<>right, then up<>fwd<>down, min guard assist. couple incidence of imbalance with pt self recovering.                                    Balance Exercises - 03/01/19 1321      Balance Exercises: Standing   Standing Eyes Closed  Narrow base of support (BOS);Wide (BOA);Head turns;Foam/compliant surface;Other reps (comment);30 secs;Limitations    Balance Beam  standing across blue foam beam with single UE support: fwd stepping to floor/back onto beam, then bwd stepping to floor/back onto beam, cues for step lenght/step height with min guard assist to min assist.      Tandem Gait  Forward;Retro;Upper extremity support;Foam/compliant surface;3 reps;Limitations    Sidestepping  Foam/compliant support;Upper extremity support;3 reps;Limitations      Balance Exercises: Standing   Standing Eyes Closed Limitations  on airex in corner with chair in front for safety: narrow base of support with EC no head movements, wide base of support with EC head movements left<>right, then up<>down. min guard to min assist for balance.      Tandem Gait Limitations  on blue foam beam for 3 laps each way, light UE support on bars with cues on technique and marching    Sidestepping Limitations  on blue foam beam- 3 laps each way with cues on step length/height and posture.           PT Short Term Goals - 02/27/19 1850      PT SHORT TERM GOAL #1   Title  Patient verbalizes residual limb pain management including modifying  assistive device.  (All STGs Target Date: 03/02/2019)    Baseline  MET 02/27/2019    Time  1    Status  Achieved    Target Date  03/02/19      PT SHORT TERM GOAL #2   Title  Patient tolerates prosthesis wear >14hrs total per day without limb pain 0/10 sitting & <3/10 standing.    Baseline  MET 02/27/2019    Time  1    Period  Months    Status  Achieved    Target Date  03/02/19      PT SHORT TERM GOAL #3  Title  Berg Balance >/=  41/56    Baseline  MET 02/27/2019  Merrilee Jansky 53/56    Time  1    Period  Months    Status  Achieved    Target Date  03/02/19      PT SHORT TERM GOAL #4   Title  Patient ambulates 300' with cane & prosthesis with supervision.    Baseline  MET 02/27/2019    Time  1    Period  Months    Status  Achieved    Target Date  03/02/19      PT SHORT TERM GOAL #5   Title  Patient negotiates stairs with single rail, ramps & curbs with cane & prosthesis with supervision.    Baseline  MET 02/27/2019    Time  1    Period  Months    Status  Achieved    Target Date  03/02/19        PT Long Term Goals - 02/27/19 1853      PT LONG TERM GOAL #1   Title  Patient verbalizes & demonstrates proper prosthetic care to enable safe use of prosthesis. (All LTGs Target Date: 04/04/2019)    Time  3    Period  Months    Status  On-going    Target Date  04/04/19      PT LONG TERM GOAL #2   Title  Patient tolerates prosthesis wear >90% of awake hours without skin issues or limb pain to enable function throughout his day.      Time  3    Period  Months    Status  On-going    Target Date  04/04/19      PT LONG TERM GOAL #3   Title  Berg Balance >45/56 to indicate lower fall risk.     Baseline  MET 02/27/2019  Berg Balance 53/56    Time  3    Period  Months    Status  Achieved      PT LONG TERM GOAL #4   Title  Patient ambulates 500' outdoors including grass with LRAD & Prosthesis modified independent.     Time  3    Period  Months    Status  On-going    Target Date  04/04/19       PT LONG TERM GOAL #5   Title  Patient negotiates stairs, ramps & curbs with LRAD & prosthesis modified independent to enable community access.     Time  3    Period  Months    Status  On-going    Target Date  04/04/19            Plan - 03/01/19 1301    Clinical Impression Statement  Today's skilled session continued to focus on high level balance activities and general mobility with prosthesis only. The pt is making steady progress toward goals and may meet goals early.    Personal Factors and Comorbidities  Age;Comorbidity 3+;Past/Current Experience;Time since onset of injury/illness/exacerbation    Comorbidities  L TTA, DM2, neuropathy, HTN, RBBB,     Examination-Activity Limitations  Carry;Lift;Locomotion Level;Squat;Stairs;Stand;Transfers    Examination-Participation Restrictions  Community Activity;Driving;Yard Work    [employer]chant [employer] officer  Evolving/Moderate complexity    Rehab Potential  Good    PT Frequency  2x / week    PT Duration  12 weeks    PT Treatment/Interventions  ADLs/Self Care Home Management;DME Instruction;Gait training;Stair training;Functional mobility training;Therapeutic activities;Therapeutic exercise;Balance training;Neuromuscular re-education;Patient/family education;Prosthetic  Training    PT Next Visit Plan  work towards LTGs;  standing balance and prosthetic gait    Consulted and Agree with Plan of Care  Patient       Patient will benefit from skilled therapeutic intervention in order to improve the following deficits and impairments:  Abnormal gait, Decreased activity tolerance, Decreased balance, Decreased endurance, Decreased knowledge of use of DME, Decreased mobility, Decreased skin integrity, Decreased strength, Postural dysfunction, Prosthetic Dependency, Pain  Visit Diagnosis: 1. Other abnormalities of gait and mobility   2. Muscle weakness (generalized)   3. Unsteadiness on feet   4. Abnormal posture        Problem  List Patient Active Problem List   Diagnosis Date Noted  . S/P BKA (below knee amputation), left ([facility]) 10/13/2018  . Charcot foot due to diabetes mellitus ([facility]) 01/26/2018  . PE (physical exam), annual 12/29/2016  . Neuropathy 12/29/2016  . BPH (benign prostatic hyperplasia) 12/29/2016  . Advanced care planning/counseling discussion 12/29/2016  . DM type 2 with diabetic peripheral neuropathy ([facility]) 06/30/2016  . Anemia 07/28/2015  . Essential hypertension     Willow Ora, PTA, [facility] [address], [place], [place] [address] [phone] 03/02/19, 1:50 PM   Name: EZEQUIAS LARD MRN: [id number] Date of Birth: 04/14/1951

## 2019-03-06 ENCOUNTER — Encounter: Payer: Self-pay | Admitting: Physical Therapy

## 2019-03-06 ENCOUNTER — Other Ambulatory Visit: Payer: Self-pay

## 2019-03-06 ENCOUNTER — Ambulatory Visit: Payer: Medicare Other | Admitting: Physical Therapy

## 2019-03-06 DIAGNOSIS — M6281 Muscle weakness (generalized): Secondary | ICD-10-CM

## 2019-03-06 DIAGNOSIS — R2689 Other abnormalities of gait and mobility: Secondary | ICD-10-CM

## 2019-03-06 DIAGNOSIS — R2681 Unsteadiness on feet: Secondary | ICD-10-CM

## 2019-03-06 DIAGNOSIS — R293 Abnormal posture: Secondary | ICD-10-CM

## 2019-03-07 NOTE — Therapy (Signed)
[facility] [address] [place], [place], [address] Phone: [phone]   Fax:  [phone]  Physical Therapy Treatment & Discharge Summary  Patient Details  Name: Daniel Leon MRN: [id number] Date of Birth: April 04, 1951 Referring Provider (PT): Dondra Prader, NP   Encounter Date: 03/06/2019   CLINIC OPERATION CHANGES: Outpatient [facility] is open at lower capacity following universal masking, social distancing, and patient screening.  The patient's COVID risk of complications score is 5.  PHYSICAL THERAPY DISCHARGE SUMMARY  Visits from Start of Care: 15  Current functional level related to goals / functional outcomes: See below   Remaining deficits: See below   Education / Equipment: Prosthetic care & use. HEP / ongoing fitness including use of exercise equipment when gym reopens.    Plan: Patient agrees to discharge.  Patient goals were met. Patient is being discharged due to meeting the stated rehab goals.  ?????        PT End of Session - 03/07/19 1933    Visit Number  15    Number of Visits  25    Date for PT Re-Evaluation  04/04/19    Authorization Type  [employer]HC Medicare & Medicaid    PT Start Time  1250    PT Stop Time  1330    PT Time Calculation (min)  40 min    Equipment Utilized During Treatment  Gait belt    Activity Tolerance  Patient tolerated treatment well    Behavior During Therapy  WFL for tasks assessed/performed       Past Medical History:  Diagnosis Date  . 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 Charcot ankle, left    collapse  . Charcot's joint of foot   . Diabetes mellitus without complication (HCC)    DIET-CONTROLLED  . Early cataracts, bilateral   . Essential hypertension   . Right bundle branch block (RBBB)   . Traumatic arthropathy of ankle and foot   . Wears glasses     Past Surgical History:  Procedure Laterality Date  . AMPUTATION Left  10/13/2018   Procedure: LEFT BELOW KNEE AMPUTATION;  Surgeon: Newt Minion, MD;  Location: [facility];  Service: Orthopedics;  Laterality: Left;  . COLONOSCOPY    . ELBOW SURGERY Right 1966  . FOOT ARTHRODESIS, SUBTALAR Right 2019  . TONSILLECTOMY    . TOTAL ANKLE REPLACEMENT    . ULNAR NERVE TRANSPOSITION Right 09/05/2015   Procedure: ULNAR NERVE DECOMPRESSION/TRANSPOSITION;  Surgeon: Christophe Louis, MD;  Location: [facility];  Service: Orthopedics;  Laterality: Right;    There were no vitals filed for this visit.  Subjective Assessment - 03/06/19 1250    Subjective  The end of bone is still tender but mild now.    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  No/denies         [facility] PT Assessment - 03/06/19 1300      Assessment   Medical Diagnosis  Left Transtibial Amputation      Posture/Postural Control   Posture/Postural Control  No significant limitations      Transfers   Transfers  Sit to Stand;Stand to Sit    Sit to Stand  7: Independent;Without upper extremity assist;From chair/3-in-1  Stand to Sit  7: Independent;Without upper extremity assist;To chair/3-in-1      Ambulation/Gait   Ambulation/Gait  Yes    Ambulation/Gait Assistance  7: Independent    Ambulation Distance (Feet)  1000 Feet    Assistive device  Prosthesis;None    Gait Pattern  Within Functional Limits    Ambulation Surface  Indoor;Level;Outdoor;Paved;Gravel;Grass;Unlevel;Other (comment)   grass hill   Gait velocity  3.15 ft/sec   Initial 1.55 ft/sec    Stairs  Yes    Stairs Assistance  6: Modified independent (Device/Increase time)    Stair Management Technique  One rail Right;Alternating pattern;Forwards    Number of Stairs  4    Height of Stairs  6    Door Management  7: Independent    Ramp  6:  Modified independent (Device)   prosthesis only   Curb  6: Modified independent (Device/increase time)   prosthesis only     Berg Balance Test   Sit to Stand  Able to stand without using hands and stabilize independently    Standing Unsupported  Able to stand safely 2 minutes    Sitting with Back Unsupported but Feet Supported on Floor or Stool  Able to sit safely and securely 2 minutes    Stand to Sit  Sits safely with minimal use of hands    Transfers  Able to transfer safely, minor use of hands    Standing Unsupported with Eyes Closed  Able to stand 10 seconds safely    Standing Unsupported with Feet Together  Able to place feet together independently and stand 1 minute safely    From Standing, Reach Forward with Outstretched Arm  Can reach confidently >25 cm (10")    From Standing Position, Pick up Object from Floor  Able to pick up shoe safely and easily    From Standing Position, Turn to Look Behind Over each Shoulder  Looks behind from both sides and weight shifts well    Turn 360 Degrees  Able to turn 360 degrees safely in 4 seconds or less    Standing Unsupported, Alternately Place Feet on Step/Stool  Able to stand independently and safely and complete 8 steps in 20 seconds    Standing Unsupported, One Foot in Front  Able to plae foot ahead of the other independently and hold 30 seconds    Standing on One Leg  Able to lift leg independently and hold > 10 seconds    Total Score  55      Functional Gait  Assessment   Gait assessed   Yes    Gait Level Surface  Walks 20 ft in less than 7 sec but greater than 5.5 sec, uses assistive device, slower speed, mild gait deviations, or deviates 6-10 in outside of the 12 in walkway width.    Change in Gait Speed  Able to change speed, demonstrates mild gait deviations, deviates 6-10 in outside of the 12 in walkway width, or no gait deviations, unable to achieve a major change in velocity, or uses a change in velocity, or uses an assistive  device.    Gait with Horizontal Head Turns  Performs head turns smoothly with no change in gait. Deviates no more than 6 in outside 12 in walkway width    Gait with Vertical Head Turns  Performs head turns with no change in gait. Deviates no more than 6 in outside 12 in walkway width.    Gait and Pivot Turn  Pivot turns safely within  3 sec and stops quickly with no loss of balance.    Step Over Obstacle  Is able to step over one shoe box (4.5 in total height) without changing gait speed. No evidence of imbalance.    Gait with Narrow Base of Support  Ambulates less than 4 steps heel to toe or cannot perform without assistance.    Gait with Eyes Closed  Walks 20 ft, uses assistive device, slower speed, mild gait deviations, deviates 6-10 in outside 12 in walkway width. Ambulates 20 ft in less than 9 sec but greater than 7 sec.    Ambulating Backwards  Walks 20 ft, uses assistive device, slower speed, mild gait deviations, deviates 6-10 in outside 12 in walkway width.    Steps  Alternating feet, must use rail.    Total Score  21      Prosthetics Assessment - 03/06/19 1300      Prosthetics   Prosthetic Care Independent with  Skin check;Residual limb care;Care of non-amputated limb;Prosthetic cleaning;Ply sock cleaning;Correct ply sock adjustment;Proper wear schedule/adjustment;Proper weight-bearing schedule/adjustment    Donning prosthesis   Modified independent (Device/Increase time)    Doffing prosthesis   Modified independent (Device/Increase time)    Current prosthetic wear tolerance (days/week)   daily    Current prosthetic wear tolerance (#hours/day)   all awake hours    Current prosthetic weight-bearing tolerance (hours/day)   Patient tolerated 20 minutes of standing & gait with prosthesis only with mild distal tibia discomfort no increase.     Edema  no edema    Residual limb condition   intact, normal color & temperature.     K code/activity level with prosthetic use   K3 full community  with variable cadence,  silicon liner with shuttle pin lock, total contact socket, dynamic response foot                  [facility] Adult PT Treatment/Exercise - 03/06/19 1300      Therapeutic Activites    Lifting  Pt lifts 25# crate safely    Other Therapeutic Activities  PT demo, instructed in climbing A-frame ladder & simulated 2 handed task. Pt return demo understanding safely.                PT Short Term Goals - 02/27/19 1850      PT SHORT TERM GOAL #1   Title  Patient verbalizes residual limb pain management including modifying assistive device.  (All STGs Target Date: 03/02/2019)    Baseline  MET 02/27/2019    Time  1    Status  Achieved    Target Date  03/02/19      PT SHORT TERM GOAL #2   Title  Patient tolerates prosthesis wear >14hrs total per day without limb pain 0/10 sitting & <3/10 standing.    Baseline  MET 02/27/2019    Time  1    Period  Months    Status  Achieved    Target Date  03/02/19      PT SHORT TERM GOAL #3   Title  Berg Balance >/=  41/56    Baseline  MET 02/27/2019  Berg 53/56    Time  1    Period  Months    Status  Achieved    Target Date  03/02/19      PT SHORT TERM GOAL #4   Title  Patient ambulates 300' with cane & prosthesis with supervision.    Baseline  MET 02/27/2019  Time  1    Period  Months    Status  Achieved    Target Date  03/02/19      PT SHORT TERM GOAL #5   Title  Patient negotiates stairs with single rail, ramps & curbs with cane & prosthesis with supervision.    Baseline  MET 02/27/2019    Time  1    Period  Months    Status  Achieved    Target Date  03/02/19        PT Long Term Goals - 03/06/19 1934      PT LONG TERM GOAL #1   Title  Patient verbalizes & demonstrates proper prosthetic care to enable safe use of prosthesis. (All LTGs Target Date: 04/04/2019)    Baseline  MET 03/07/2019    Time  3    Period  Months    Status  Achieved      PT LONG TERM GOAL #2   Title  Patient tolerates prosthesis wear  >90% of awake hours without skin issues or limb pain to enable function throughout his day.      Baseline  MET 03/07/2019    Time  3    Period  Months    Status  Achieved      PT LONG TERM GOAL #3   Title  Berg Balance >45/56 to indicate lower fall risk.     Baseline  MET 03/07/2019  Berg Balance 55/56    Time  3    Period  Months    Status  Achieved      PT LONG TERM GOAL #4   Title  Patient ambulates 500' outdoors including grass with LRAD & Prosthesis modified independent.     Baseline  MET 03/07/2019 Patient ambulates 1000' with prosthesis only outdoors including grass hill modified independent.    Time  3    Period  Months    Status  Achieved      PT LONG TERM GOAL #5   Title  Patient negotiates stairs, ramps & curbs with LRAD & prosthesis modified independent to enable community access.     Baseline  MET 03/07/2019 with prosthesis only    Time  3    Period  Months    Status  Achieved            Plan - 03/06/19 1936    Clinical Impression Statement  Patient met all long term goals. Berg balance improved to 55/56 indicating low fall risk. Gait velocity improved to 3.15 ft/sec indicating lower fall risk & community mobility. Functional Gait Assessment of 21/30 (>19/30) indicates low fall risk. Patient is ambulating with prosthesis only at community level.    Personal Factors and Comorbidities  Age;Comorbidity 3+;Past/Current Experience;Time since onset of injury/illness/exacerbation    Comorbidities  L TTA, DM2, neuropathy, HTN, RBBB,     Examination-Activity Limitations  Carry;Lift;Locomotion Level;Squat;Stairs;Stand;Transfers    Examination-Participation Restrictions  Community Activity;Driving;Yard Work    [employer] officer  Evolving/Moderate complexity    Rehab Potential  Good    PT Frequency  2x / week    PT Duration  12 weeks    PT Treatment/Interventions  ADLs/Self Care Home Management;DME Instruction;Gait training;Stair training;Functional mobility  training;Therapeutic activities;Therapeutic exercise;Balance training;Neuromuscular re-education;Patient/family education;Prosthetic Training    PT Next Visit Plan  discharge    Consulted and Agree with Plan of Care  Patient       Patient will benefit from skilled therapeutic intervention in order to improve the following deficits  and impairments:  Abnormal gait, Decreased activity tolerance, Decreased balance, Decreased endurance, Decreased knowledge of use of DME, Decreased mobility, Decreased skin integrity, Decreased strength, Postural dysfunction, Prosthetic Dependency, Pain  Visit Diagnosis: 1. Muscle weakness (generalized)   2. Other abnormalities of gait and mobility   3. Unsteadiness on feet   4. Abnormal posture        Problem List Patient Active Problem List   Diagnosis Date Noted  . S/P BKA (below knee amputation), left ([place]) 10/13/2018  . Charcot foot due to diabetes mellitus ([place]) 01/26/2018  . PE (physical exam), annual 12/29/2016  . Neuropathy 12/29/2016  . BPH (benign prostatic hyperplasia) 12/29/2016  . Advanced care planning/counseling discussion 12/29/2016  . DM type 2 with diabetic peripheral neuropathy ([place]) 06/30/2016  . Anemia 07/28/2015  . Essential hypertension     WALDRON,ROBIN PT, DPT 03/07/2019, 7:39 PM  [facility] [address] [place], [place], [address] Phone: [phone]   Fax:  [phone]  Name: Daniel Leon MRN: [id number] Date of Birth: 1951-04-27

## 2019-03-08 ENCOUNTER — Encounter: Payer: Medicare Other | Admitting: Physical Therapy

## 2019-03-12 ENCOUNTER — Other Ambulatory Visit (INDEPENDENT_AMBULATORY_CARE_PROVIDER_SITE_OTHER): Payer: Self-pay | Admitting: Orthopedic Surgery

## 2019-03-13 ENCOUNTER — Encounter: Payer: Medicare Other | Admitting: Physical Therapy

## 2019-03-15 ENCOUNTER — Encounter: Payer: Medicare Other | Admitting: Physical Therapy

## 2019-03-20 ENCOUNTER — Encounter: Payer: Medicare Other | Admitting: Physical Therapy

## 2019-03-22 ENCOUNTER — Encounter: Payer: Medicare Other | Admitting: Physical Therapy

## 2019-03-24 HISTORY — PX: EYE SURGERY: SHX253

## 2019-04-12 ENCOUNTER — Other Ambulatory Visit (INDEPENDENT_AMBULATORY_CARE_PROVIDER_SITE_OTHER): Payer: Self-pay | Admitting: Orthopedic Surgery

## 2019-04-19 ENCOUNTER — Other Ambulatory Visit: Payer: Self-pay | Admitting: Family Medicine

## 2019-04-19 DIAGNOSIS — I10 Essential (primary) hypertension: Secondary | ICD-10-CM

## 2019-04-19 NOTE — Telephone Encounter (Signed)
See below. Patient is aware that he needs a visit.

## 2019-04-19 NOTE — Telephone Encounter (Signed)
Requested medication (s) are due for refill today: yes  Requested medication (s) are on the active medication list: yes  Last refill:  10/2018  Future visit scheduled: no  Notes to clinic:  Review for refill Spoke with patient and he states that he will call office to make appointment    Requested Prescriptions  Pending Prescriptions Disp Refills   lisinopril (ZESTRIL) 10 MG tablet [Pharmacy Med Name: LISINOPRIL 10MG  TABLETS] 90 tablet 0    Sig: TAKE 1 TABLET BY MOUTH EVERY DAY     Cardiovascular:  ACE Inhibitors Failed - 04/19/2019 11:40 AM      Failed - Cr in normal range and within 180 days    Creatinine, Ser  Date Value Ref Range Status  10/13/2018 1.39 (H) 0.61 - 1.24 mg/dL Final         Failed - K in normal range and within 180 days    Potassium  Date Value Ref Range Status  10/13/2018 4.8 3.5 - 5.1 mmol/L Final         Failed - Last BP in normal range    BP Readings from Last 1 Encounters:  10/17/18 (!) 144/82         Failed - Valid encounter within last 6 months    Recent Outpatient Visits          9 months ago Essential hypertension   [facility], Jeannette How, MD   1 year ago Essential hypertension   [facility], Jeannette How, MD   1 year ago Essential hypertension   [place], Jeannette How, MD   1 year ago DM type 2 with diabetic peripheral neuropathy [facility])   [place], Jeannette How, MD   2 years ago Essential hypertension   [place], Jeannette How, MD      Future Appointments            In 3 weeks Newt Minion, MD [place] - Patient is not pregnant

## 2019-05-09 ENCOUNTER — Other Ambulatory Visit (INDEPENDENT_AMBULATORY_CARE_PROVIDER_SITE_OTHER): Payer: Self-pay | Admitting: Orthopedic Surgery

## 2019-05-10 ENCOUNTER — Ambulatory Visit: Payer: Medicare Other | Admitting: Orthopedic Surgery

## 2019-05-16 ENCOUNTER — Telehealth: Payer: Self-pay | Admitting: Family Medicine

## 2019-05-16 NOTE — Telephone Encounter (Signed)
Patient would like call back from [place], he stated that she called him and missed the call regarding covid screening questions.

## 2019-05-17 ENCOUNTER — Ambulatory Visit (INDEPENDENT_AMBULATORY_CARE_PROVIDER_SITE_OTHER): Payer: Medicare Other | Admitting: Orthopedic Surgery

## 2019-05-17 ENCOUNTER — Ambulatory Visit (INDEPENDENT_AMBULATORY_CARE_PROVIDER_SITE_OTHER): Payer: Medicare Other

## 2019-05-17 ENCOUNTER — Encounter: Payer: Self-pay | Admitting: Orthopedic Surgery

## 2019-05-17 ENCOUNTER — Other Ambulatory Visit: Payer: Self-pay

## 2019-05-17 VITALS — Ht 76.0 in | Wt 250.0 lb

## 2019-05-17 DIAGNOSIS — Z23 Encounter for immunization: Secondary | ICD-10-CM

## 2019-05-17 DIAGNOSIS — Z89512 Acquired absence of left leg below knee: Secondary | ICD-10-CM

## 2019-05-22 ENCOUNTER — Encounter: Payer: Self-pay | Admitting: Orthopedic Surgery

## 2019-05-22 NOTE — Progress Notes (Signed)
Office Visit Note   Patient: Daniel Leon           Date of Birth: Oct 26, 1950           MRN: [id number] Visit Date: 05/17/2019              Requested by: Steele Sizer, MD [address] [place],  [place] [address] PCP: Steele Sizer, MD  Chief Complaint  Patient presents with  . Left Leg - Routine Post Op    10/13/2018 Left BKA      HPI: Patient is a 68 year old gentleman who presents 7 months status post left transtibial amputation.  Patient has no concerns or issues he is currently wearing his prosthesis he has 6 ply sock.  Assessment & Plan: Visit Diagnoses:  1. S/P BKA (below knee amputation), left (HCC)     Plan: Patient will continue to increase his activities as tolerated follow-up as needed if he develops any ulcerations.  Follow-Up Instructions: Return if symptoms worsen or fail to improve.   Ortho Exam  Patient is alert, oriented, no adenopathy, well-dressed, normal affect, normal respiratory effort. Examination patient is wearing 6 ply sock he has full extension of his knee there is no callus no ulcer no signs of infection.  The residual limb is well consolidated  Imaging: No results found. No images are attached to the encounter.  Labs: Lab Results  Component Value Date   HGBA1C 5.7 (H) 10/13/2018   HGBA1C 5.8 07/13/2018   HGBA1C 5.9 07/28/2017     Lab Results  Component Value Date   ALBUMIN 4.0 01/26/2018   ALBUMIN 4.4 12/29/2016   ALBUMIN 4.2 12/24/2015    No results found for: MG No results found for: VD25OH  No results found for: PREALBUMIN CBC EXTENDED Latest Ref Rng & Units 10/13/2018 01/26/2018 01/27/2017  WBC 4.0 - 10.5 K/uL 5.7 6.3 6.4  RBC 4.22 - 5.81 MIL/uL 3.97(L) 3.96(L) 4.24  HGB 13.0 - 17.0 g/dL 11.3(L) 11.0(L) 12.8(L)  HCT 39.0 - 52.0 % 35.1(L) 33.4(L) 37.9  PLT 150 - 400 K/uL 256 313 246  NEUTROABS 1.4 - 7.0 x10E3/uL - 3.8 3.5  LYMPHSABS 0.7 - 3.1 x10E3/uL - 1.5 1.8     Body mass index is 30.43 kg/m.  Orders:   No orders of the defined types were placed in this encounter.  No orders of the defined types were placed in this encounter.    Procedures: No procedures performed  Clinical Data: No additional findings.  ROS:  All other systems negative, except as noted in the HPI. Review of Systems  Objective: Vital Signs: Ht 6\' 4"  (1.93 m)   Wt 250 lb (113.4 kg)   BMI 30.43 kg/m   Specialty Comments:  No specialty comments available.  PMFS History: Patient Active Problem List   Diagnosis Date Noted  . S/P BKA (below knee amputation), left (HCC) 10/13/2018  . Charcot foot due to diabetes mellitus (HCC) 01/26/2018  . PE (physical exam), annual 12/29/2016  . Neuropathy 12/29/2016  . BPH (benign prostatic hyperplasia) 12/29/2016  . Advanced care planning/counseling discussion 12/29/2016  . DM type 2 with diabetic peripheral neuropathy (HCC) 06/30/2016  . Anemia 07/28/2015  . Essential hypertension    Past Medical History:  Diagnosis Date  . 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 Charcot ankle, left    collapse  . Charcot's joint of foot   . Diabetes mellitus without complication (HCC)    DIET-CONTROLLED  .  Early cataracts, bilateral   . Essential hypertension   . Right bundle branch block (RBBB)   . Traumatic arthropathy of ankle and foot   . Wears glasses     Family History  Problem Relation Age of Onset  . Heart disease Mother   . Hypertension Mother   . Heart disease Father   . Hypertension Father   . Hypertension Brother   . Prostate cancer Neg Hx   . Bladder Cancer Neg Hx   . Kidney cancer Neg Hx     Past Surgical History:  Procedure Laterality Date  . AMPUTATION Left 10/13/2018   Procedure: LEFT BELOW KNEE AMPUTATION;  Surgeon: Newt Minion, MD;  Location: [facility];  Service: Orthopedics;  Laterality: Left;  . COLONOSCOPY    . ELBOW SURGERY Right 1966  . FOOT ARTHRODESIS, SUBTALAR Right 2019  . TONSILLECTOMY    . TOTAL ANKLE REPLACEMENT    .  ULNAR NERVE TRANSPOSITION Right 09/05/2015   Procedure: ULNAR NERVE DECOMPRESSION/TRANSPOSITION;  Surgeon: Christophe Louis, MD;  Location: [facility];  Service: Orthopedics;  Laterality: Right;   Social History   Occupational History  . Not on file  Tobacco Use  . Smoking status: Former Smoker    Packs/day: 0.50    Years: 10.00    Pack years: 5.00    Types: Cigarettes    Quit date: 07/27/2000    Years since quitting: 18.8  . Smokeless tobacco: Never Used  Substance and Sexual Activity  . Alcohol use: Yes    Comment: very little  . Drug use: No  . Sexual activity: Not on file

## 2019-06-08 ENCOUNTER — Other Ambulatory Visit (INDEPENDENT_AMBULATORY_CARE_PROVIDER_SITE_OTHER): Payer: Self-pay | Admitting: Orthopedic Surgery

## 2019-06-20 LAB — HM DIABETES EYE EXAM

## 2019-07-23 ENCOUNTER — Other Ambulatory Visit: Payer: Self-pay

## 2019-07-23 DIAGNOSIS — I10 Essential (primary) hypertension: Secondary | ICD-10-CM

## 2019-07-23 NOTE — Telephone Encounter (Signed)
Needs appointment

## 2019-07-23 NOTE — Telephone Encounter (Signed)
Called pt to schedule, he states he will think about it and call us back after going over provider list.

## 2019-07-25 ENCOUNTER — Other Ambulatory Visit: Payer: Self-pay

## 2019-07-25 ENCOUNTER — Other Ambulatory Visit: Payer: Self-pay | Admitting: Family

## 2019-07-25 DIAGNOSIS — I10 Essential (primary) hypertension: Secondary | ICD-10-CM

## 2019-07-25 MED ORDER — GABAPENTIN 400 MG PO CAPS: ORAL_CAPSULE | ORAL | 0 refills | Status: DC

## 2019-07-25 NOTE — Telephone Encounter (Signed)
Appears last visit in office 07/13/18, needs visit and will place enough medication to last until visit once scheduled.

## 2019-07-26 NOTE — Telephone Encounter (Signed)
LVM for pt to schedule appt. Will also mail letter.

## 2019-07-26 NOTE — Telephone Encounter (Signed)
Please get patient scheduled and let Jolene know  

## 2019-07-27 ENCOUNTER — Other Ambulatory Visit: Payer: Self-pay | Admitting: Family Medicine

## 2019-07-27 DIAGNOSIS — I10 Essential (primary) hypertension: Secondary | ICD-10-CM

## 2019-07-27 MED ORDER — LISINOPRIL 10 MG PO TABS: 10.0000 mg | ORAL_TABLET | Freq: Every day | ORAL | 0 refills | Status: DC

## 2019-07-27 NOTE — Telephone Encounter (Signed)
Forwarding medication refill request to PCP for review. 

## 2019-08-01 ENCOUNTER — Encounter: Payer: Self-pay | Admitting: Family Medicine

## 2019-08-21 ENCOUNTER — Other Ambulatory Visit: Payer: Self-pay | Admitting: Family Medicine

## 2019-08-21 DIAGNOSIS — I10 Essential (primary) hypertension: Secondary | ICD-10-CM

## 2019-08-22 ENCOUNTER — Other Ambulatory Visit: Payer: Self-pay | Admitting: Family Medicine

## 2019-08-22 DIAGNOSIS — I10 Essential (primary) hypertension: Secondary | ICD-10-CM

## 2019-09-07 ENCOUNTER — Other Ambulatory Visit: Payer: Self-pay | Admitting: Family

## 2019-09-13 ENCOUNTER — Ambulatory Visit: Payer: Medicare PPO | Attending: Internal Medicine

## 2019-09-13 DIAGNOSIS — Z23 Encounter for immunization: Secondary | ICD-10-CM | POA: Insufficient documentation

## 2019-09-13 NOTE — Progress Notes (Signed)
   Covid-19 Vaccination Clinic  Name:  Daniel Leon    MRN: [id number] DOB: [birth year]  09/13/2019  [name] was observed post Covid-19 immunization for 15 minutes without incidence. He was provided with Vaccine Information Sheet and instruction to access the V-Safe system.   [name] was instructed to call 911 with any severe reactions post vaccine: Marland Kitchen Difficulty breathing  . Swelling of your face and throat  . A fast heartbeat  . A bad rash all over your body  . Dizziness and weakness    Immunizations Administered    Name Date Dose VIS Date Route   Pfizer COVID-19 Vaccine 09/13/2019  9:20 AM 0.3 mL 08/03/2019 Intramuscular   Manufacturer: [employer], [employer]   Lot: [id number]   NDC: [address]-9

## 2019-09-19 ENCOUNTER — Other Ambulatory Visit: Payer: Self-pay | Admitting: Family Medicine

## 2019-09-19 DIAGNOSIS — I10 Essential (primary) hypertension: Secondary | ICD-10-CM

## 2019-09-19 NOTE — Telephone Encounter (Signed)
Requested medication (s) are due for refill today: yes  Requested medication (s) are on the active medication list: yes  Last refill:  08/22/2019  Future visit scheduled: yes  Notes to clinic:  Patient has appointment tomorrow  Requesting for 90 day supply   Requested Prescriptions  Pending Prescriptions Disp Refills   lisinopril (ZESTRIL) 10 MG tablet [Pharmacy Med Name: LISINOPRIL 10MG  TABLETS] 90 tablet     Sig: TAKE 1 TABLET(10 MG) BY MOUTH DAILY      Cardiovascular:  ACE Inhibitors Failed - 09/19/2019  7:56 AM      Failed - Cr in normal range and within 180 days    Creatinine, Ser  Date Value Ref Range Status  10/13/2018 1.39 (H) 0.61 - 1.24 mg/dL Final          Failed - K in normal range and within 180 days    Potassium  Date Value Ref Range Status  10/13/2018 4.8 3.5 - 5.1 mmol/L Final          Failed - Last BP in normal range    BP Readings from Last 1 Encounters:  10/17/18 (!) 144/82          Failed - Valid encounter within last 6 months    Recent Outpatient Visits           1 year ago Essential hypertension   [facility] Crissman, 10/19/18, MD   1 year ago Essential hypertension   [facility] Crissman, Redge Gainer, MD   1 year ago Essential hypertension   [facility] Crissman, Mark A, MD   2 years ago DM type 2 with diabetic peripheral neuropathy (HCC)   [facility] Crissman, Redge Gainer, MD   2 years ago Essential hypertension   [facility] Crissman, Redge Gainer, MD       Future Appointments             Tomorrow  [facility]   Tomorrow [place], Megan P, DO [facility], [facility]            Passed - Patient is not pregnant

## 2019-09-20 ENCOUNTER — Encounter: Payer: Self-pay | Admitting: Family Medicine

## 2019-09-20 ENCOUNTER — Ambulatory Visit (INDEPENDENT_AMBULATORY_CARE_PROVIDER_SITE_OTHER): Payer: Medicare PPO | Admitting: Family Medicine

## 2019-09-20 ENCOUNTER — Ambulatory Visit (INDEPENDENT_AMBULATORY_CARE_PROVIDER_SITE_OTHER): Payer: Medicare PPO

## 2019-09-20 VITALS — BP 140/79 | HR 78 | Wt 259.0 lb

## 2019-09-20 DIAGNOSIS — Z89512 Acquired absence of left leg below knee: Secondary | ICD-10-CM

## 2019-09-20 DIAGNOSIS — E1142 Type 2 diabetes mellitus with diabetic polyneuropathy: Secondary | ICD-10-CM | POA: Diagnosis not present

## 2019-09-20 DIAGNOSIS — Z0000 Encounter for general adult medical examination without abnormal findings: Secondary | ICD-10-CM | POA: Diagnosis not present

## 2019-09-20 DIAGNOSIS — I10 Essential (primary) hypertension: Secondary | ICD-10-CM

## 2019-09-20 DIAGNOSIS — N400 Enlarged prostate without lower urinary tract symptoms: Secondary | ICD-10-CM | POA: Diagnosis not present

## 2019-09-20 DIAGNOSIS — D649 Anemia, unspecified: Secondary | ICD-10-CM

## 2019-09-20 MED ORDER — LISINOPRIL 10 MG PO TABS: ORAL_TABLET | ORAL | 1 refills | Status: DC

## 2019-09-20 NOTE — Progress Notes (Signed)
Subjective:   Daniel Leon is a 69 y.o. male who presents for Medicare Annual/Subsequent preventive examination.  This visit is being conducted via phone call  - after an attmept to do on video chat - due to the COVID-19 pandemic. This patient has given me verbal consent via phone to conduct this visit, patient states they are participating from their home address. Some vital signs may be absent or patient reported.   Patient identification: identified by name, DOB, and current address.    Review of Systems:   Cardiac Risk Factors include: advanced age (>38men, >17 women);male gender;dyslipidemia;diabetes mellitus;hypertension;obesity (BMI >30kg/m2)     Objective:    Vitals: BP 140/79 Comment: before medications this morning  Pulse 78   Wt 259 lb (117.5 kg)   BMI 31.53 kg/m   Body mass index is 31.53 kg/m.  Advanced Directives 09/20/2019 01/04/2019 10/15/2018 10/13/2018 10/13/2018 04/20/2018 04/07/2017  Does Patient Have a Medical Advance Directive? Yes Yes - Yes Yes Yes Yes  Type of Advance Directive Living will;Healthcare Power of [employer] Power of [place];Living will - - Healthcare Power of [place];Living will Healthcare Power of [place];Living will Healthcare Power of [place];Living will  Does patient want to make changes to medical advance directive? - - No - Patient declined - - - -  Copy of Healthcare Power of Attorney in Chart? No - copy requested No - copy requested - - No - copy requested No - copy requested No - copy requested    Tobacco Social History   Tobacco Use  Smoking Status Former Smoker  . Packs/day: 0.50  . Years: 10.00  . Pack years: 5.00  . Types: Cigarettes  . Quit date: 07/27/2000  . Years since quitting: 19.1  Smokeless Tobacco Never Used     Counseling given: Not Answered   Clinical Intake:  Pre-visit preparation completed: Yes  Pain : No/denies pain     Nutritional Status: BMI > 30  Obese Nutritional Risks:  None Diabetes: Yes CBG done?: No Did pt. bring in CBG monitor from home?: No  How often do you need to have someone help you when you read instructions, pamphlets, or other written materials from your doctor or pharmacy?: 1 - Never  Nutrition Risk Assessment:  Has the patient had any N/V/D within the last 2 months?  No  Does the patient have any non-healing wounds?  No  Has the patient had any unintentional weight loss or weight gain?  No   Diabetes:  Is the patient diabetic?  Yes  If diabetic, was a CBG obtained today?  No  Did the patient bring in their glucometer from home?  No  How often do you monitor your CBG's? As needed.   Financial Strains and Diabetes Management:  Are you having any financial strains with the device, your supplies or your medication? No .  Does the patient want to be seen by Chronic Care Management for management of their diabetes?  No  Would the patient like to be referred to a Nutritionist or for Diabetic Management?  No   Diabetic Exams:  Diabetic Eye Exam: Overdue for diabetic eye exam. Pt has been advised about the importance in completing this exam.   Diabetic Foot Exam:  Pt has been advised about the importance in completing this exam.  Interpreter Needed?: No  Information entered by :: Erroll Wilbourne,LPN  Past Medical History:  Diagnosis Date  . 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 Charcot ankle,  left    collapse  . Charcot's joint of foot   . Diabetes mellitus without complication (HCC)    DIET-CONTROLLED  . Early cataracts, bilateral   . Essential hypertension   . Right bundle branch block (RBBB)   . Traumatic arthropathy of ankle and foot   . Wears glasses    Past Surgical History:  Procedure Laterality Date  . AMPUTATION Left 10/13/2018   Procedure: LEFT BELOW KNEE AMPUTATION;  Surgeon: [name], MD;  Location: [facility] OR;  Service: Orthopedics;  Laterality: Left;  . COLONOSCOPY    . ELBOW SURGERY Right 1966   . EYE SURGERY  03/2019   cataracts- [place]   . EYE SURGERY  02/2019   cataracts - [place]  . FOOT ARTHRODESIS, SUBTALAR Right 2019  . TONSILLECTOMY    . TOTAL ANKLE REPLACEMENT    . ULNAR NERVE TRANSPOSITION Right 09/05/2015   Procedure: ULNAR NERVE DECOMPRESSION/TRANSPOSITION;  Surgeon: [name], MD;  Location: [facility] ORS;  Service: Orthopedics;  Laterality: Right;   Family History  Problem Relation Age of Onset  . Heart disease Mother   . Hypertension Mother   . Heart disease Father   . Hypertension Father   . Hypertension Brother   . Prostate cancer Neg Hx   . Bladder Cancer Neg Hx   . Kidney cancer Neg Hx    Social History   Socioeconomic History  . Marital status: Married    Spouse name: Not on file  . Number of children: Not on file  . Years of education: college   . Highest education level: Not on file  Occupational History  . Not on file  Tobacco Use  . Smoking status: Former Smoker    Packs/day: 0.50    Years: 10.00    Pack years: 5.00    Types: Cigarettes    Quit date: 07/27/2000    Years since quitting: 19.1  . Smokeless tobacco: Never Used  Substance and Sexual Activity  . Alcohol use: Yes    Comment: very little  . Drug use: No  . Sexual activity: Not on file  Other Topics Concern  . Not on file  Social History Narrative  . Not on file   Social Determinants of Health   Financial Resource Strain:   . Difficulty of Paying Living Expenses: Not on file  Food Insecurity:   . Worried About Programme researcher, broadcasting/film/video in the Last Year: Not on file  . Ran Out of Food in the Last Year: Not on file  Transportation Needs:   . Lack of Transportation (Medical): Not on file  . Lack of Transportation (Non-Medical): Not on file  Physical Activity:   . Days of Exercise per Week: Not on file  . Minutes of Exercise per Session: Not on file  Stress:   . Feeling of Stress : Not on file  Social Connections:   . Frequency of Communication with Friends and  Family: Not on file  . Frequency of Social Gatherings with Friends and Family: Not on file  . Attends Religious Services: Not on file  . Active Member of Clubs or Organizations: Not on file  . Attends Banker Meetings: Not on file  . Marital Status: Not on file    Outpatient Encounter Medications as of 09/20/2019  Medication Sig  . Apoaequorin (PREVAGEN PO) Take by mouth.  Marland Kitchen aspirin EC 81 MG tablet Take 81 mg by mouth daily.   . Cholecalciferol (VITAMIN D3) 125 MCG (5000 UT)  CAPS Take 5,000 Units by mouth daily.  . diclofenac sodium (VOLTAREN) 1 % GEL Apply 1 application topically 4 (four) times daily as needed (pain).  Marland Kitchen gabapentin (NEURONTIN) 400 MG capsule TAKE 1 CAPSULE BY MOUTH TWICE DAILY. MAY TAKE A 3 RD CAPSULE AS NEEDED FOR PAIN  . lisinopril (ZESTRIL) 10 MG tablet TAKE 1 TABLET(10 MG) BY MOUTH DAILY  . Multiple Vitamin (MULTIVITAMIN) tablet Take 1 tablet by mouth daily.  . [DISCONTINUED] celecoxib (CELEBREX) 200 MG capsule Take 200 mg by mouth at bedtime.   . [DISCONTINUED] HYDROcodone-acetaminophen (NORCO/VICODIN) 5-325 MG tablet Take 1-2 tablets by mouth every 4 (four) hours as needed for moderate pain. (Patient not taking: Reported on 09/20/2019)   No facility-administered encounter medications on file as of 09/20/2019.    Activities of Daily Living In your present state of health, do you have any difficulty performing the following activities: 09/20/2019 10/16/2018  Hearing? N -  Comment no hearing aids -  Vision? N -  Comment reading glasses, goes to [place] for eye appt -  Difficulty concentrating or making decisions? Y -  Comment taking prevagen -  Walking or climbing stairs? N -  Comment takes time going up -  Dressing or bathing? N -  Doing errands, shopping? N N  Preparing Food and eating ? N -  Using the Toilet? N -  In the past six months, have you accidently leaked urine? N -  Do you have problems with loss of bowel control? N -  Managing your  Medications? N -  Managing your Finances? N -  Housekeeping or managing your Housekeeping? N -  Some recent data might be hidden    Patient Care Team: Steele Sizer, MD as PCP - General (Family Medicine) Winfred Leeds, Basilia Jumbo, MD as Referring Physician (Orthopedic Surgery) Gordan Payment, DO as Referring Physician (Physical Medicine and Rehabilitation)   Assessment:   This is a routine wellness examination for Daniel Leon.  Exercise Activities and Dietary recommendations Current Exercise Habits: Home exercise routine, Type of exercise: strength training/weights(elliptical), Time (Minutes): 40, Frequency (Times/Week): 7, Weekly Exercise (Minutes/Week): 280, Intensity: Mild, Exercise limited by: None identified  Goals    . DIET - INCREASE WATER INTAKE     Recommend drinking at least 6-8 glasses of water a day        Fall Risk Fall Risk  09/20/2019 04/20/2018 02/28/2018 04/07/2017 12/29/2016  Falls in the past year? 0 No No No No  Number falls in past yr: 0 - - - -  Injury with Fall? 0 - - - -   FALL RISK PREVENTION PERTAINING TO THE HOME:  Any stairs in or around the home? Yes  If so, are there any without handrails? No   Home free of loose throw rugs in walkways, pet beds, electrical cords, etc? Yes  Adequate lighting in your home to reduce risk of falls? Yes   ASSISTIVE DEVICES UTILIZED TO PREVENT FALLS:  Life alert? No  Use of a cane, walker or w/c? No walking stick as needed Grab bars in the bathroom? No  Shower chair or bench in shower? Yes  Elevated toilet seat or a handicapped toilet? No     Depression Screen PHQ 2/9 Scores 09/20/2019 04/20/2018 04/07/2017 12/24/2015  PHQ - 2 Score 0 0 0 0    Cognitive Function     6CIT Screen 09/20/2019 04/20/2018  What Year? 0 points 0 points  What month? 0 points 0 points  What time? 0 points 0  points  Count back from 20 0 points 0 points  Months in reverse 0 points 0 points  Repeat phrase 0 points 2 points  Total Score 0 2     Immunization History  Administered Date(s) Administered  . Fluad Quad(high Dose 65+) 05/17/2019  . Influenza, High Dose Seasonal PF 06/30/2016  . Influenza,inj,Quad PF,6+ Mos 07/28/2015  . Influenza-Unspecified 06/22/2018  . PFIZER SARS-COV-2 Vaccination 09/13/2019  . Pneumococcal Conjugate-13 12/17/2014  . Pneumococcal Polysaccharide-23 06/30/2016  . Tdap 07/25/2012  . Zoster 06/12/2013    Qualifies for Shingles Vaccine? Yes  Zostavax completed 2014. Due for Shingrix. Education has been provided regarding the importance of this vaccine. Pt has been advised to call insurance company to determine out of pocket expense. Advised may also receive vaccine at local pharmacy or Health Dept. Verbalized acceptance and understanding.  Tdap: up to date   Flu Vaccine: up to date   Pneumococcal Vaccine: up to date   Screening Tests Health Maintenance  Topic Date Due  . OPHTHALMOLOGY EXAM  09/02/1959  . FOOT EXAM  03/01/2019  . HEMOGLOBIN A1C  04/13/2019  . TETANUS/TDAP  07/25/2022  . COLONOSCOPY  12/13/2022  . INFLUENZA VACCINE  Completed  . Hepatitis C Screening  Completed  . PNA vac Low Risk Adult  Completed   Cancer Screenings:  Colorectal Screening: Completed 12/12/2012. Repeat every 10 years  Lung Cancer Screening: (Low Dose CT Chest recommended if Age 1-80 years, 30 pack-year currently smoking OR have quit w/in 15years.) does not qualify.    Additional Screening:  Hepatitis C Screening: does qualify; Completed 5/3/017  Dental Screening: Recommended annual dental exams for proper oral hygiene  Community Resource Referral:  CRR required this visit?  No        Plan:  I have personally reviewed and addressed the Medicare Annual Wellness questionnaire and have noted the following in the patient's chart:  A. Medical and social history B. Use of alcohol, tobacco or illicit drugs  C. Current medications and supplements D. Functional ability and status E.  Nutritional  status F.  Physical activity G. Advance directives H. List of other physicians I.  Hospitalizations, surgeries, and ER visits in previous 12 months J.  [place] such as hearing and vision if needed, cognitive and depression L. Referrals and appointments   In addition, I have reviewed and discussed with patient certain preventive protocols, quality metrics, and best practice recommendations. A written personalized care plan for preventive services as well as general preventive health recommendations were provided to patient.   Signed,   Bevelyn Ngo, LPN  8/75/6433 Nurse Health Advisor  Nurse Notes:   BP has been increasing some in the last couple weeks. He will have his BP's ready to review with Dr.Johnson today at 345.   Increase in numbness in right foot since last year. Due for diabetic foot exam.

## 2019-09-20 NOTE — Progress Notes (Signed)
BP 140/79   Pulse 78   Wt 259 lb (117.5 kg)   BMI 31.53 kg/m    Subjective:    Patient ID: Daniel Leon, male    DOB: 10/03/1950, 69 y.o.   MRN: [id number]  HPI: Daniel Leon is a 69 y.o. male  Chief Complaint  Patient presents with  . Diabetes  . Hypertension  . Hyperlipidemia   HYPERTENSION- he notes that his BP is sneaking up.  Hypertension status: exacerbated  Satisfied with current treatment? no Duration of hypertension: chronic BP monitoring frequency:  a few times a week BP range:  130s/70s-80s BP medication side effects:  no Medication compliance: excellent compliance Previous BP meds: lisinopril Aspirin: yes Recurrent headaches: no Visual changes: no Palpitations: no Dyspnea: no Chest pain: no Lower extremity edema: no Dizzy/lightheaded: no  DIABETES Hypoglycemic episodes:no Polydipsia/polyuria: no Visual disturbance: no Chest pain: no Paresthesias: no Glucose Monitoring: no  Accucheck frequency: Not Checking Taking Insulin?: no Blood Pressure Monitoring: not checking Retinal Examination: Not up to Date Foot Exam: Up to Date Diabetic Education: Completed Pneumovax: Up to Date Influenza: Up to Date Aspirin: yes  BPH BPH status:  no issues Satisfied with current treatment?: N/A Duration: unsure Nocturia: no Urinary frequency:no Incomplete voiding: no Urgency: no Weak urinary stream: no Straining to start stream: no Dysuria: no Onset: gradual Severity: not at all  Relevant past medical, surgical, family and social history reviewed and updated as indicated. Interim medical history since our last visit reviewed. Allergies and medications reviewed and updated.  Review of Systems  Constitutional: Negative.   Respiratory: Negative.   Cardiovascular: Negative.   Gastrointestinal: Negative.   Genitourinary: Negative.   Musculoskeletal: Negative.   Psychiatric/Behavioral: Negative.     Per HPI unless specifically indicated  above     Objective:    BP 140/79   Pulse 78   Wt 259 lb (117.5 kg)   BMI 31.53 kg/m   Wt Readings from Last 3 Encounters:  09/20/19 259 lb (117.5 kg)  09/20/19 259 lb (117.5 kg)  05/17/19 250 lb (113.4 kg)    Physical Exam Vitals and nursing note reviewed.  Pulmonary:     Effort: Pulmonary effort is normal. No respiratory distress.     Comments: Speaking in full sentences Neurological:     Mental Status: He is alert.  Psychiatric:        Mood and Affect: Mood normal.        Behavior: Behavior normal.        Thought Content: Thought content normal.        Judgment: Judgment normal.     Results for orders placed or performed during the hospital encounter of 10/13/18  Glucose, capillary  Result Value Ref Range   Glucose-Capillary 108 (H) 70 - 99 mg/dL  Hemoglobin A1c  Result Value Ref Range   Hgb A1c MFr Bld 5.7 (H) 4.8 - 5.6 %   Mean Plasma Glucose 116.89 mg/dL  Basic metabolic panel  Result Value Ref Range   Sodium 137 135 - 145 mmol/L   Potassium 4.8 3.5 - 5.1 mmol/L   Chloride 105 98 - 111 mmol/L   CO2 22 22 - 32 mmol/L   Glucose, Bld 122 (H) 70 - 99 mg/dL   BUN 26 (H) 8 - 23 mg/dL   Creatinine, Ser 1.39 (H) 0.61 - 1.24 mg/dL   Calcium 8.8 (L) 8.9 - 10.3 mg/dL   GFR calc non Af Amer 51 (L) >60 mL/min   GFR  calc Af Amer 60 (L) >60 mL/min   Anion gap 10 5 - 15  CBC  Result Value Ref Range   WBC 5.7 4.0 - 10.5 K/uL   RBC 3.97 (L) 4.22 - 5.81 MIL/uL   Hemoglobin 11.3 (L) 13.0 - 17.0 g/dL   HCT 03.7 (L) 09.6 - 43.8 %   MCV 88.4 80.0 - 100.0 fL   MCH 28.5 26.0 - 34.0 pg   MCHC 32.2 30.0 - 36.0 g/dL   RDW 38.1 84.0 - 37.5 %   Platelets 256 150 - 400 K/uL   nRBC 0.0 0.0 - 0.2 %  Glucose, capillary  Result Value Ref Range   Glucose-Capillary 102 (H) 70 - 99 mg/dL  Glucose, capillary  Result Value Ref Range   Glucose-Capillary 113 (H) 70 - 99 mg/dL  Glucose, capillary  Result Value Ref Range   Glucose-Capillary 119 (H) 70 - 99 mg/dL  Glucose, capillary    Result Value Ref Range   Glucose-Capillary 179 (H) 70 - 99 mg/dL  Glucose, capillary  Result Value Ref Range   Glucose-Capillary 140 (H) 70 - 99 mg/dL  Glucose, capillary  Result Value Ref Range   Glucose-Capillary 116 (H) 70 - 99 mg/dL  Glucose, capillary  Result Value Ref Range   Glucose-Capillary 112 (H) 70 - 99 mg/dL  Glucose, capillary  Result Value Ref Range   Glucose-Capillary 127 (H) 70 - 99 mg/dL  Glucose, capillary  Result Value Ref Range   Glucose-Capillary 138 (H) 70 - 99 mg/dL  Glucose, capillary  Result Value Ref Range   Glucose-Capillary 103 (H) 70 - 99 mg/dL  Glucose, capillary  Result Value Ref Range   Glucose-Capillary 125 (H) 70 - 99 mg/dL  Glucose, capillary  Result Value Ref Range   Glucose-Capillary 152 (H) 70 - 99 mg/dL  Glucose, capillary  Result Value Ref Range   Glucose-Capillary 111 (H) 70 - 99 mg/dL  Glucose, capillary  Result Value Ref Range   Glucose-Capillary 96 70 - 99 mg/dL  Glucose, capillary  Result Value Ref Range   Glucose-Capillary 122 (H) 70 - 99 mg/dL  Glucose, capillary  Result Value Ref Range   Glucose-Capillary 121 (H) 70 - 99 mg/dL  Glucose, capillary  Result Value Ref Range   Glucose-Capillary 123 (H) 70 - 99 mg/dL  Glucose, capillary  Result Value Ref Range   Glucose-Capillary 119 (H) 70 - 99 mg/dL  Glucose, capillary  Result Value Ref Range   Glucose-Capillary 127 (H) 70 - 99 mg/dL  Glucose, capillary  Result Value Ref Range   Glucose-Capillary 146 (H) 70 - 99 mg/dL      Assessment & Plan:   Problem List Items Addressed This Visit      Cardiovascular and Mediastinum   Essential hypertension    Diastolic has been creeping up. We will increase to 15mg  daily and recheck 34month. Call with any concerns.       Relevant Medications   lisinopril (ZESTRIL) 10 MG tablet   Other Relevant Orders   CBC with Differential/Platelet   Comprehensive metabolic panel   TSH     Endocrine   DM type 2 with diabetic  peripheral neuropathy (HCC) - Primary    Will get him in for labs. Await results. Treat as needed. Call with any concerns.       Relevant Medications   lisinopril (ZESTRIL) 10 MG tablet   Other Relevant Orders   Bayer DCA Hb A1c Waived   CBC with Differential/Platelet   Comprehensive metabolic  panel   Lipid Panel w/o Chol/HDL Ratio   Microalbumin, Urine Waived     Genitourinary   BPH (benign prostatic hyperplasia)    Under good control off medicine. Continue to monitor. Call with any concerns. Call with any concerns.       Relevant Orders   CBC with Differential/Platelet   Comprehensive metabolic panel   PSA   UA/M w/rflx Culture, Routine     Other   Anemia    Will get him in for repeat labs. Await results. Treat as needed.       Relevant Orders   CBC with Differential/Platelet   Comprehensive metabolic panel   S/P BKA (below knee amputation), left (HCC)    Stable. Doing well. Continue to monitor. Call with any concerns.       Relevant Orders   CBC with Differential/Platelet   Comprehensive metabolic panel       Follow up plan: Return in about 4 weeks (around 10/18/2019) for Physical.   . This visit was completed via D[employer]imity due to the restrictions of the COVID-19 pandemic. All issues as above were discussed and addressed. Physical exam was done as above through visual confirmation on Doximity. If it was felt that the patient should be evaluated in the office, they were directed there. The patient verbally consented to this visit. . Location of the patient: home . Location of the provider: work . Those involved with this call:  . Provider: Olevia Perches, DO . CMA: Tiffany Reel, CMA . Front Desk/Registration: Adela Ports  . Time spent on call: 25 minutes with patient face to face via video conference. More than 50% of this time was spent in counseling and coordination of care. 40 minutes total spent in review of patient's record and preparation of their  chart.

## 2019-09-20 NOTE — Assessment & Plan Note (Addendum)
Diastolic has been creeping up. We will increase to 15mg  daily and recheck 67month. Call with any concerns.

## 2019-09-20 NOTE — Patient Instructions (Signed)
[name] , Thank you for taking time to come for your Medicare Wellness Visit. I appreciate your ongoing commitment to your health goals. Please review the following plan we discussed and let me know if I can assist you in the future.   Screening recommendations/referrals: Colonoscopy: completed 2014, due 2024 Recommended yearly ophthalmology/optometry visit for glaucoma screening and checkup Recommended yearly dental visit for hygiene and checkup  Vaccinations: Influenza vaccine: up to date Pneumococcal vaccine: up to date Tdap vaccine: up to date Shingles vaccine: shingrix eligible Covid-19 Vaccine: first dose done, second dose scheduled     Advanced directives: Please bring a copy of your health care power of attorney and living will to the office at your convenience.  Conditions/risks identified: diabetic, discussed chronic care management team   Next appointment: Follow up in one year for your annual wellness visit   Preventive Care 65 Years and Older, Male Preventive care refers to lifestyle choices and visits with your health care provider that can promote health and wellness. What does preventive care include?  A yearly physical exam. This is also called an annual well check.  Dental exams once or twice a year.  Routine eye exams. Ask your health care provider how often you should have your eyes checked.  Personal lifestyle choices, including:  Daily care of your teeth and gums.  Regular physical activity.  Eating a healthy diet.  Avoiding tobacco and drug use.  Limiting alcohol use.  Practicing safe sex.  Taking low doses of aspirin every day.  Taking vitamin and mineral supplements as recommended by your health care provider. What happens during an annual well check? The services and screenings done by your health care provider during your annual well check will depend on your age, overall health, lifestyle risk factors, and family history of  disease. Counseling  Your health care provider may ask you questions about your:  Alcohol use.  Tobacco use.  Drug use.  Emotional well-being.  Home and relationship well-being.  Sexual activity.  Eating habits.  History of falls.  Memory and ability to understand (cognition).  Work and work Astronomer. Screening  You may have the following tests or measurements:  Height, weight, and BMI.  Blood pressure.  Lipid and cholesterol levels. These may be checked every 5 years, or more frequently if you are over 62 years old.  Skin check.  Lung cancer screening. You may have this screening every year starting at age 57 if you have a 30-pack-year history of smoking and currently smoke or have quit within the past 15 years.  Fecal occult blood test (FOBT) of the stool. You may have this test every year starting at age 52.  Flexible sigmoidoscopy or colonoscopy. You may have a sigmoidoscopy every 5 years or a colonoscopy every 10 years starting at age 108.  Prostate cancer screening. Recommendations will vary depending on your family history and other risks.  Hepatitis C blood test.  Hepatitis B blood test.  Sexually transmitted disease (STD) testing.  Diabetes screening. This is done by checking your blood sugar (glucose) after you have not eaten for a while (fasting). You may have this done every 1-3 years.  Abdominal aortic aneurysm (AAA) screening. You may need this if you are a current or former smoker.  Osteoporosis. You may be screened starting at age 5 if you are at high risk. Talk with your health care provider about your test results, treatment options, and if necessary, the need for more tests. Vaccines  Your health care provider may recommend certain vaccines, such as:  Influenza vaccine. This is recommended every year.  Tetanus, diphtheria, and acellular pertussis (Tdap, Td) vaccine. You may need a Td booster every 10 years.  Zoster vaccine. You may  need this after age 54.  Pneumococcal 13-valent conjugate (PCV13) vaccine. One dose is recommended after age 45.  Pneumococcal polysaccharide (PPSV23) vaccine. One dose is recommended after age 101. Talk to your health care provider about which screenings and vaccines you need and how often you need them. This information is not intended to replace advice given to you by your health care provider. Make sure you discuss any questions you have with your health care provider. Document Released: 09/05/2015 Document Revised: 04/28/2016 Document Reviewed: 06/10/2015 Elsevier Interactive Patient Education  2017 [place] Prevention in the Home Falls can cause injuries. They can happen to people of all ages. There are many things you can do to make your home safe and to help prevent falls. What can I do on the outside of my home?  Regularly fix the edges of walkways and driveways and fix any cracks.  Remove anything that might make you trip as you walk through a door, such as a raised step or threshold.  Trim any bushes or trees on the path to your home.  Use bright outdoor lighting.  Clear any walking paths of anything that might make someone trip, such as rocks or tools.  Regularly check to see if handrails are loose or broken. Make sure that both sides of any steps have handrails.  Any raised decks and porches should have guardrails on the edges.  Have any leaves, snow, or ice cleared regularly.  Use sand or salt on walking paths during winter.  Clean up any spills in your garage right away. This includes oil or grease spills. What can I do in the bathroom?  Use night lights.  Install grab bars by the toilet and in the tub and shower. Do not use towel bars as grab bars.  Use non-skid mats or decals in the tub or shower.  If you need to sit down in the shower, use a plastic, non-slip stool.  Keep the floor dry. Clean up any water that spills on the floor as soon as it  happens.  Remove soap buildup in the tub or shower regularly.  Attach bath mats securely with double-sided non-slip rug tape.  Do not have throw rugs and other things on the floor that can make you trip. What can I do in the bedroom?  Use night lights.  Make sure that you have a light by your bed that is easy to reach.  Do not use any sheets or blankets that are too big for your bed. They should not hang down onto the floor.  Have a firm chair that has side arms. You can use this for support while you get dressed.  Do not have throw rugs and other things on the floor that can make you trip. What can I do in the kitchen?  Clean up any spills right away.  Avoid walking on wet floors.  Keep items that you use a lot in easy-to-reach places.  If you need to reach something above you, use a strong step stool that has a grab bar.  Keep electrical cords out of the way.  Do not use floor polish or wax that makes floors slippery. If you must use wax, use non-skid floor wax.  Do  not have throw rugs and other things on the floor that can make you trip. What can I do with my stairs?  Do not leave any items on the stairs.  Make sure that there are handrails on both sides of the stairs and use them. Fix handrails that are broken or loose. Make sure that handrails are as long as the stairways.  Check any carpeting to make sure that it is firmly attached to the stairs. Fix any carpet that is loose or worn.  Avoid having throw rugs at the top or bottom of the stairs. If you do have throw rugs, attach them to the floor with carpet tape.  Make sure that you have a light switch at the top of the stairs and the bottom of the stairs. If you do not have them, ask someone to add them for you. What else can I do to help prevent falls?  Wear shoes that:  Do not have high heels.  Have rubber bottoms.  Are comfortable and fit you well.  Are closed at the toe. Do not wear sandals.  If you  use a stepladder:  Make sure that it is fully opened. Do not climb a closed stepladder.  Make sure that both sides of the stepladder are locked into place.  Ask someone to hold it for you, if possible.  Clearly mark and make sure that you can see:  Any grab bars or handrails.  First and last steps.  Where the edge of each step is.  Use tools that help you move around (mobility aids) if they are needed. These include:  Canes.  Walkers.  Scooters.  Crutches.  Turn on the lights when you go into a dark area. Replace any light bulbs as soon as they burn out.  Set up your furniture so you have a clear path. Avoid moving your furniture around.  If any of your floors are uneven, fix them.  If there are any pets around you, be aware of where they are.  Review your medicines with your doctor. Some medicines can make you feel dizzy. This can increase your chance of falling. Ask your doctor what other things that you can do to help prevent falls. This information is not intended to replace advice given to you by your health care provider. Make sure you discuss any questions you have with your health care provider. Document Released: 06/05/2009 Document Revised: 01/15/2016 Document Reviewed: 09/13/2014 Elsevier Interactive Patient Education  2017 [employer].

## 2019-09-23 NOTE — Assessment & Plan Note (Signed)
Will get him in for labs. Await results. Treat as needed. Call with any concerns.

## 2019-09-23 NOTE — Assessment & Plan Note (Signed)
Will get him in for repeat labs. Await results. Treat as needed.

## 2019-09-23 NOTE — Assessment & Plan Note (Signed)
Stable. Doing well. Continue to monitor. Call with any concerns.  

## 2019-09-23 NOTE — Assessment & Plan Note (Signed)
Under good control off medicine. Continue to monitor. Call with any concerns. Call with any concerns.

## 2019-10-03 ENCOUNTER — Other Ambulatory Visit: Payer: Medicare PPO

## 2019-10-03 ENCOUNTER — Other Ambulatory Visit: Payer: Self-pay

## 2019-10-03 DIAGNOSIS — N400 Enlarged prostate without lower urinary tract symptoms: Secondary | ICD-10-CM

## 2019-10-03 DIAGNOSIS — D649 Anemia, unspecified: Secondary | ICD-10-CM

## 2019-10-03 DIAGNOSIS — E1142 Type 2 diabetes mellitus with diabetic polyneuropathy: Secondary | ICD-10-CM

## 2019-10-03 DIAGNOSIS — I10 Essential (primary) hypertension: Secondary | ICD-10-CM

## 2019-10-03 DIAGNOSIS — Z89512 Acquired absence of left leg below knee: Secondary | ICD-10-CM

## 2019-10-03 LAB — BAYER DCA HB A1C WAIVED: HB A1C (BAYER DCA - WAIVED): 5.5 % (ref <7.0)

## 2019-10-03 LAB — UA/M W/RFLX CULTURE, ROUTINE
Bilirubin, UA: NEGATIVE
Glucose, UA: NEGATIVE
Ketones, UA: NEGATIVE
Leukocytes,UA: NEGATIVE
Nitrite, UA: NEGATIVE
Protein,UA: NEGATIVE
RBC, UA: NEGATIVE
Specific Gravity, UA: 1.02 (ref 1.005–1.030)
Urobilinogen, Ur: 0.2 mg/dL (ref 0.2–1.0)
pH, UA: 5.5 (ref 5.0–7.5)

## 2019-10-03 LAB — MICROALBUMIN, URINE WAIVED
Creatinine, Urine Waived: 100 mg/dL (ref 10–300)
Microalb, Ur Waived: 30 mg/L — ABNORMAL HIGH (ref 0–19)
Microalb/Creat Ratio: <30 mg/g (ref <30)

## 2019-10-04 ENCOUNTER — Ambulatory Visit: Payer: Medicare PPO | Attending: Internal Medicine

## 2019-10-04 DIAGNOSIS — Z23 Encounter for immunization: Secondary | ICD-10-CM

## 2019-10-04 LAB — CBC WITH DIFFERENTIAL/PLATELET
Basophils Absolute: 0.1 10*3/uL (ref 0.0–0.2)
Basos: 1 %
EOS (ABSOLUTE): 0.2 10*3/uL (ref 0.0–0.4)
Eos: 3 %
Hematocrit: 37.2 % — ABNORMAL LOW (ref 37.5–51.0)
Hemoglobin: 12.8 g/dL — ABNORMAL LOW (ref 13.0–17.7)
Immature Grans (Abs): 0 10*3/uL (ref 0.0–0.1)
Immature Granulocytes: 1 %
Lymphocytes Absolute: 1.4 10*3/uL (ref 0.7–3.1)
Lymphs: 21 %
MCH: 31.2 pg (ref 26.6–33.0)
MCHC: 34.4 g/dL (ref 31.5–35.7)
MCV: 91 fL (ref 79–97)
Monocytes Absolute: 0.7 10*3/uL (ref 0.1–0.9)
Monocytes: 11 %
Neutrophils Absolute: 4.2 10*3/uL (ref 1.4–7.0)
Neutrophils: 63 %
Platelets: 280 10*3/uL (ref 150–450)
RBC: 4.1 x10E6/uL — ABNORMAL LOW (ref 4.14–5.80)
RDW: 12.4 % (ref 11.6–15.4)
WBC: 6.6 10*3/uL (ref 3.4–10.8)

## 2019-10-04 LAB — PSA: Prostate Specific Ag, Serum: 1 ng/mL (ref 0.0–4.0)

## 2019-10-04 LAB — COMPREHENSIVE METABOLIC PANEL
ALT: 18 IU/L (ref 0–44)
AST: 20 IU/L (ref 0–40)
Albumin/Globulin Ratio: 1.4 (ref 1.2–2.2)
Albumin: 4.2 g/dL (ref 3.8–4.8)
Alkaline Phosphatase: 64 IU/L (ref 39–117)
BUN/Creatinine Ratio: 25 — ABNORMAL HIGH (ref 10–24)
BUN: 28 mg/dL — ABNORMAL HIGH (ref 8–27)
Bilirubin Total: 0.3 mg/dL (ref 0.0–1.2)
CO2: 23 mmol/L (ref 20–29)
Calcium: 9.7 mg/dL (ref 8.6–10.2)
Chloride: 103 mmol/L (ref 96–106)
Creatinine, Ser: 1.13 mg/dL (ref 0.76–1.27)
GFR calc Af Amer: 76 mL/min/{1.73_m2} (ref >59)
GFR calc non Af Amer: 65 mL/min/{1.73_m2} (ref >59)
Globulin, Total: 3.1 g/dL (ref 1.5–4.5)
Glucose: 133 mg/dL — ABNORMAL HIGH (ref 65–99)
Potassium: 5.1 mmol/L (ref 3.5–5.2)
Sodium: 141 mmol/L (ref 134–144)
Total Protein: 7.3 g/dL (ref 6.0–8.5)

## 2019-10-04 LAB — LIPID PANEL W/O CHOL/HDL RATIO
Cholesterol, Total: 199 mg/dL (ref 100–199)
HDL: 34 mg/dL — ABNORMAL LOW (ref >39)
LDL Chol Calc (NIH): 85 mg/dL (ref 0–99)
Triglycerides: 495 mg/dL — ABNORMAL HIGH (ref 0–149)
VLDL Cholesterol Cal: 80 mg/dL — ABNORMAL HIGH (ref 5–40)

## 2019-10-04 LAB — TSH: TSH: 0.904 u[IU]/mL (ref 0.450–4.500)

## 2019-10-04 NOTE — Progress Notes (Signed)
   [facility]  Name:  Daniel Leon    MRN: [id number] DOB: April 25, 1951  10/04/2019  Daniel Leon was observed post Covid-19 immunization for 15 minutes without incidence. He was provided with Vaccine Information Sheet and instruction to access the V-Safe system.   Daniel Leon was instructed to call 911 with any severe reactions post vaccine: Marland Kitchen Difficulty breathing  . Swelling of your face and throat  . A fast heartbeat  . A bad rash all over your body  . Dizziness and weakness    Immunizations Administered    Name Date Dose VIS Date Route   Pfizer COVID-19 Vaccine 10/04/2019  9:40 AM 0.3 mL 08/03/2019 Intramuscular   Manufacturer: [employer], [employer]   Lot: [id number]   NDC: [address]-9

## 2019-10-11 ENCOUNTER — Encounter: Payer: Self-pay | Admitting: Family Medicine

## 2019-10-25 ENCOUNTER — Other Ambulatory Visit: Payer: Self-pay | Admitting: Family

## 2019-11-08 ENCOUNTER — Encounter: Payer: Medicare PPO | Admitting: Family Medicine

## 2019-11-23 ENCOUNTER — Other Ambulatory Visit: Payer: Self-pay | Admitting: Orthopedic Surgery

## 2019-12-03 ENCOUNTER — Ambulatory Visit (INDEPENDENT_AMBULATORY_CARE_PROVIDER_SITE_OTHER): Payer: Medicare PPO | Admitting: Family Medicine

## 2019-12-03 ENCOUNTER — Encounter: Payer: Self-pay | Admitting: Family Medicine

## 2019-12-03 ENCOUNTER — Other Ambulatory Visit: Payer: Self-pay

## 2019-12-03 VITALS — BP 128/64 | HR 82 | Temp 98.0°F | Ht 74.02 in | Wt 263.4 lb

## 2019-12-03 DIAGNOSIS — I10 Essential (primary) hypertension: Secondary | ICD-10-CM | POA: Diagnosis not present

## 2019-12-03 DIAGNOSIS — D649 Anemia, unspecified: Secondary | ICD-10-CM | POA: Diagnosis not present

## 2019-12-03 DIAGNOSIS — D692 Other nonthrombocytopenic purpura: Secondary | ICD-10-CM

## 2019-12-03 DIAGNOSIS — Z0000 Encounter for general adult medical examination without abnormal findings: Secondary | ICD-10-CM

## 2019-12-03 DIAGNOSIS — E1142 Type 2 diabetes mellitus with diabetic polyneuropathy: Secondary | ICD-10-CM

## 2019-12-03 DIAGNOSIS — N400 Enlarged prostate without lower urinary tract symptoms: Secondary | ICD-10-CM | POA: Diagnosis not present

## 2019-12-03 DIAGNOSIS — Z89512 Acquired absence of left leg below knee: Secondary | ICD-10-CM

## 2019-12-03 LAB — UA/M W/RFLX CULTURE, ROUTINE
Bilirubin, UA: NEGATIVE
Glucose, UA: NEGATIVE
Ketones, UA: NEGATIVE
Leukocytes,UA: NEGATIVE
Nitrite, UA: NEGATIVE
Protein,UA: NEGATIVE
RBC, UA: NEGATIVE
Specific Gravity, UA: 1.015 (ref 1.005–1.030)
Urobilinogen, Ur: 0.2 mg/dL (ref 0.2–1.0)
pH, UA: 5 (ref 5.0–7.5)

## 2019-12-03 LAB — MICROALBUMIN, URINE WAIVED
Creatinine, Urine Waived: 50 mg/dL (ref 10–300)
Microalb, Ur Waived: 10 mg/L (ref 0–19)

## 2019-12-03 LAB — BAYER DCA HB A1C WAIVED: HB A1C (BAYER DCA - WAIVED): 5.8 % (ref <7.0)

## 2019-12-03 MED ORDER — LISINOPRIL 10 MG PO TABS: ORAL_TABLET | ORAL | 0 refills | Status: DC

## 2019-12-03 NOTE — Assessment & Plan Note (Signed)
Rechecking labs today. Await results. Call with any concerns.  

## 2019-12-03 NOTE — Progress Notes (Signed)
BP 128/64 (BP Location: Left Arm, Cuff Size: Normal)   Pulse 82   Temp 98 F (36.7 C) (Oral)   Ht 6' 2.02" (1.88 m)   Wt 263 lb 6.4 oz (119.5 kg)   SpO2 95%   BMI 33.80 kg/m    Subjective:    Patient ID: Daniel Leon, male    DOB: 28-Aug-1950, 69 y.o.   MRN: [id number]  HPI: Daniel EhlersMichael H Peffley is a 69 y.o. male presenting on 12/03/2019 for comprehensive medical examination. Current medical complaints include:  DIABETES Hypoglycemic episodes:no Polydipsia/polyuria: no Visual disturbance: no Chest pain: no Paresthesias: yes Glucose Monitoring: yes  Accucheck frequency: Not Checking Taking Insulin?: no Blood Pressure Monitoring: not checking Retinal Examination: Not up to Date Foot Exam: done today Diabetic Education: Completed Pneumovax: Up to Date Influenza: Up to Date Aspirin: yes  HYPERTENSION Hypertension status: controlled  Satisfied with current treatment? yes Duration of hypertension: chronic BP monitoring frequency:  weekly BP medication side effects:  no Medication compliance: excellent compliance Previous BP meds: Lisinopril Aspirin: yes Recurrent headaches: no Visual changes: no Palpitations: no Dyspnea: no Chest pain: no Lower extremity edema: no Dizzy/lightheaded: no  ANEMIA Anemia status: controlled Compliance with treatment: excellent compliance Iron supplementation side effects: no Severity of anemia: mild Fatigue: no Decreased exercise tolerance: no  Dyspnea on exertion: no Palpitations: no Bleeding: no Pica: no  BPH BPH status: controlled Satisfied with current treatment?: yes Medication side effects: no Medication compliance: excellent compliance Duration: chronic Nocturia: 1/night Urinary frequency:no Incomplete voiding: no Urgency: no Weak urinary stream: no Straining to start stream: no Dysuria: no Onset: gradual Severity: mild  Interim Problems from his last visit: no  Depression Screen done today and results  listed below:  [facility] 12/03/2019 12/03/2019 09/20/2019 04/20/2018 04/07/2017  Decreased Interest 0 0 0 0 0  Down, Depressed, Hopeless 0 0 0 0 0  PHQ - 2 Score 0 0 0 0 0  Altered sleeping 0 0 - - -  Tired, decreased energy 0 0 - - -  Change in appetite 0 0 - - -  Feeling bad or failure about yourself  0 0 - - -  Trouble concentrating 0 0 - - -  Moving slowly or fidgety/restless 0 0 - - -  Suicidal thoughts 0 0 - - -  PHQ-9 Score 0 0 - - -  Difficult doing work/chores Not difficult at all - - - -    Past Medical History:  Past Medical History:  Diagnosis Date  . 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 Charcot ankle, left    collapse  . Charcot's joint of foot   . Diabetes mellitus without complication (HCC)    DIET-CONTROLLED  . Early cataracts, bilateral   . Essential hypertension   . Right bundle branch block (RBBB)   . Traumatic arthropathy of ankle and foot   . Wears glasses     Surgical History:  Past Surgical History:  Procedure Laterality Date  . AMPUTATION Left 10/13/2018   Procedure: LEFT BELOW KNEE AMPUTATION;  Surgeon: Nadara Mustarduda, Marcus V, MD;  [facility];  Service: Orthopedics;  Laterality: Left;  . COLONOSCOPY    . ELBOW SURGERY Right 1966  . EYE SURGERY  03/2019   cataracts- [place]   . EYE SURGERY  02/2019   cataracts - [place]  . FOOT ARTHRODESIS, SUBTALAR Right 2019  . TONSILLECTOMY    . TOTAL ANKLE REPLACEMENT    . ULNAR  NERVE TRANSPOSITION Right 09/05/2015   Procedure: ULNAR NERVE DECOMPRESSION/TRANSPOSITION;  Surgeon: [name], MD;  Location: [facility] ORS;  Service: Orthopedics;  Laterality: Right;    Medications:  Current Outpatient Medications on File Prior to Visit  Medication Sig  . aspirin EC 81 MG tablet Take 81 mg by mouth daily.   . Cholecalciferol (VITAMIN D3) 125 MCG (5000 UT) CAPS Take 5,000 Units by mouth daily.  . diclofenac sodium (VOLTAREN) 1 % GEL Apply 1 application topically 4 (four)  times daily as needed (pain).  Marland Kitchen gabapentin (NEURONTIN) 400 MG capsule TAKE 1 CAPSULE BY MOUTH TWICE DAILY. MAY TAKE A 3 RD CAPSULE AS NEEDED FOR PAIN  . Multiple Vitamin (MULTIVITAMIN) tablet Take 1 tablet by mouth daily.   No current facility-administered medications on file prior to visit.    Allergies:  Allergies  Allergen Reactions  . Oxycodone Itching    Social History:  Social History   Socioeconomic History  . Marital status: Married    Spouse name: Not on file  . Number of children: Not on file  . Years of education: college   . Highest education level: Not on file  Occupational History  . Not on file  Tobacco Use  . Smoking status: Former Smoker    Packs/day: 0.50    Years: 10.00    Pack years: 5.00    Types: Cigarettes    Quit date: 07/27/2000    Years since quitting: 19.3  . Smokeless tobacco: Never Used  Substance and Sexual Activity  . Alcohol use: Yes    Comment: very little  . Drug use: No  . Sexual activity: Not on file  Other Topics Concern  . Not on file  Social History Narrative  . Not on file   Social Determinants of Health   Financial Resource Strain:   . Difficulty of Paying Living Expenses:   Food Insecurity:   . Worried About Programme researcher, broadcasting/film/video in the Last Year:   . Barista in the Last Year:   Transportation Needs:   . Freight forwarder (Medical):   Marland Kitchen Lack of Transportation (Non-Medical):   Physical Activity:   . Days of Exercise per Week:   . Minutes of Exercise per Session:   Stress:   . Feeling of Stress :   Social Connections:   . Frequency of Communication with Friends and Family:   . Frequency of Social Gatherings with Friends and Family:   . Attends Religious Services:   . Active Member of Clubs or Organizations:   . Attends Banker Meetings:   Marland Kitchen Marital Status:   Intimate Partner Violence:   . Fear of Current or Ex-Partner:   . Emotionally Abused:   Marland Kitchen Physically Abused:   . Sexually Abused:     Social History   Tobacco Use  Smoking Status Former Smoker  . Packs/day: 0.50  . Years: 10.00  . Pack years: 5.00  . Types: Cigarettes  . Quit date: 07/27/2000  . Years since quitting: 19.3  Smokeless Tobacco Never Used   Social History   Substance and Sexual Activity  Alcohol Use Yes   Comment: very little    Family History:  Family History  Problem Relation Age of Onset  . Heart disease Mother   . Hypertension Mother   . Heart disease Father   . Hypertension Father   . Hypertension Brother   . Prostate cancer Neg Hx   . Bladder Cancer Neg Hx   .  Kidney cancer Neg Hx     Past medical history, surgical history, medications, allergies, family history and social history reviewed with patient today and changes made to appropriate areas of the chart.   Review of Systems  Constitutional: Negative.   HENT: Negative.   Eyes: Negative.   Respiratory: Negative.   Cardiovascular: Negative.   Gastrointestinal: Negative.   Genitourinary: Negative.   Musculoskeletal: Positive for joint pain. Negative for back pain, falls, myalgias and neck pain.  Skin: Negative.   Neurological: Positive for tingling. Negative for dizziness, tremors, sensory change, speech change, focal weakness, seizures, loss of consciousness, weakness and headaches.  Endo/Heme/Allergies: Negative for environmental allergies and polydipsia. Bruises/bleeds easily.  Psychiatric/Behavioral: Negative.     All other ROS negative except what is listed above and in the HPI.      Objective:    BP 128/64 (BP Location: Left Arm, Cuff Size: Normal)   Pulse 82   Temp 98 F (36.7 C) (Oral)   Ht 6' 2.02" (1.88 m)   Wt 263 lb 6.4 oz (119.5 kg)   SpO2 95%   BMI 33.80 kg/m   Wt Readings from Last 3 Encounters:  12/03/19 263 lb 6.4 oz (119.5 kg)  09/20/19 259 lb (117.5 kg)  09/20/19 259 lb (117.5 kg)    Physical Exam Vitals and nursing note reviewed.  Constitutional:      General: He is not in acute  distress.    Appearance: Normal appearance. He is obese. He is not ill-appearing, toxic-appearing or diaphoretic.  HENT:     Head: Normocephalic and atraumatic.     Right Ear: Tympanic membrane, ear canal and external ear normal. There is no impacted cerumen.     Left Ear: Tympanic membrane, ear canal and external ear normal. There is no impacted cerumen.     Nose: Nose normal. No congestion or rhinorrhea.     Mouth/Throat:     Mouth: Mucous membranes are moist.     Pharynx: Oropharynx is clear. No oropharyngeal exudate or posterior oropharyngeal erythema.  Eyes:     General: No scleral icterus.       Right eye: No discharge.        Left eye: No discharge.     Extraocular Movements: Extraocular movements intact.     Conjunctiva/sclera: Conjunctivae normal.     Pupils: Pupils are equal, round, and reactive to light.  Neck:     Vascular: No carotid bruit.  Cardiovascular:     Rate and Rhythm: Normal rate and regular rhythm.     Pulses: Normal pulses.     Heart sounds: No murmur. No friction rub. No gallop.   Pulmonary:     Effort: Pulmonary effort is normal. No respiratory distress.     Breath sounds: Normal breath sounds. No stridor. No wheezing, rhonchi or rales.  Chest:     Chest wall: No tenderness.  Abdominal:     General: Abdomen is flat. Bowel sounds are normal. There is no distension.     Palpations: Abdomen is soft. There is no mass.     Tenderness: There is no abdominal tenderness. There is no right CVA tenderness, left CVA tenderness, guarding or rebound.     Hernia: No hernia is present.  Genitourinary:    Comments: Genital exam deferred with shared decision making Musculoskeletal:        General: No swelling, tenderness, deformity or signs of injury.     Cervical back: Normal range of motion and neck supple. No rigidity.  No muscular tenderness.     Right lower leg: No edema.     Left lower leg: No edema.     Comments: L below the knee amputation with prosthesis    Lymphadenopathy:     Cervical: No cervical adenopathy.  Skin:    General: Skin is warm and dry.     Capillary Refill: Capillary refill takes less than 2 seconds.     Coloration: Skin is not jaundiced or pale.     Findings: No bruising, erythema, lesion or rash.  Neurological:     General: No focal deficit present.     Mental Status: He is alert and oriented to person, place, and time.     Cranial Nerves: No cranial nerve deficit.     Sensory: No sensory deficit.     Motor: No weakness.     Coordination: Coordination normal.     Gait: Gait normal.     Deep Tendon Reflexes: Reflexes normal.  Psychiatric:        Mood and Affect: Mood normal.        Behavior: Behavior normal.        Thought Content: Thought content normal.        Judgment: Judgment normal.     Results for orders placed or performed in visit on 10/03/19  UA/M w/rflx Culture, Routine   Specimen: Urine   URINE  Result Value Ref Range   Specific Gravity, UA 1.020 1.005 - 1.030   pH, UA 5.5 5.0 - 7.5   Color, UA Yellow Yellow   Appearance Ur Clear Clear   Leukocytes,UA Negative Negative   Protein,UA Negative Negative/Trace   Glucose, UA Negative Negative   Ketones, UA Negative Negative   RBC, UA Negative Negative   Bilirubin, UA Negative Negative   Urobilinogen, Ur 0.2 0.2 - 1.0 mg/dL   Nitrite, UA Negative Negative  TSH  Result Value Ref Range   TSH 0.904 0.450 - 4.500 uIU/mL  PSA  Result Value Ref Range   Prostate Specific Ag, Serum 1.0 0.0 - 4.0 ng/mL  Microalbumin, Urine Waived  Result Value Ref Range   Microalb, Ur Waived 30 (H) 0 - 19 mg/L   Creatinine, Urine Waived 100 10 - 300 mg/dL   Microalb/Creat Ratio <30 <30 mg/g  Lipid Panel w/o Chol/HDL Ratio  Result Value Ref Range   Cholesterol, Total 199 100 - 199 mg/dL   Triglycerides 505 (H) 0 - 149 mg/dL   HDL 34 (L) >39 mg/dL   VLDL Cholesterol Cal 80 (H) 5 - 40 mg/dL   LDL Chol Calc (NIH) 85 0 - 99 mg/dL  Comprehensive metabolic panel   Result Value Ref Range   Glucose 133 (H) 65 - 99 mg/dL   BUN 28 (H) 8 - 27 mg/dL   Creatinine, Ser 7.67 0.76 - 1.27 mg/dL   GFR calc non Af Amer 65 >59 mL/min/1.73   GFR calc Af Amer 76 >59 mL/min/1.73   BUN/Creatinine Ratio 25 (H) 10 - 24   Sodium 141 134 - 144 mmol/L   Potassium 5.1 3.5 - 5.2 mmol/L   Chloride 103 96 - 106 mmol/L   CO2 23 20 - 29 mmol/L   Calcium 9.7 8.6 - 10.2 mg/dL   Total Protein 7.3 6.0 - 8.5 g/dL   Albumin 4.2 3.8 - 4.8 g/dL   Globulin, Total 3.1 1.5 - 4.5 g/dL   Albumin/Globulin Ratio 1.4 1.2 - 2.2   Bilirubin Total 0.3 0.0 - 1.2 mg/dL   Alkaline  Phosphatase 64 39 - 117 IU/L   AST 20 0 - 40 IU/L   ALT 18 0 - 44 IU/L  CBC with Differential/Platelet  Result Value Ref Range   WBC 6.6 3.4 - 10.8 x10E3/uL   RBC 4.10 (L) 4.14 - 5.80 x10E6/uL   Hemoglobin 12.8 (L) 13.0 - 17.7 g/dL   Hematocrit 37.2 (L) 37.5 - 51.0 %   MCV 91 79 - 97 fL   MCH 31.2 26.6 - 33.0 pg   MCHC 34.4 31.5 - 35.7 g/dL   RDW 12.4 11.6 - 15.4 %   Platelets 280 150 - 450 x10E3/uL   Neutrophils 63 Not Estab. %   Lymphs 21 Not Estab. %   Monocytes 11 Not Estab. %   Eos 3 Not Estab. %   Basos 1 Not Estab. %   Neutrophils Absolute 4.2 1.4 - 7.0 x10E3/uL   Lymphocytes Absolute 1.4 0.7 - 3.1 x10E3/uL   Monocytes Absolute 0.7 0.1 - 0.9 x10E3/uL   EOS (ABSOLUTE) 0.2 0.0 - 0.4 x10E3/uL   Basophils Absolute 0.1 0.0 - 0.2 x10E3/uL   Immature Granulocytes 1 Not Estab. %   Immature Grans (Abs) 0.0 0.0 - 0.1 x10E3/uL  Bayer DCA Hb A1c Waived  Result Value Ref Range   HB A1C (BAYER DCA - WAIVED) 5.5 <7.0 %      Assessment & Plan:   Problem List Items Addressed This Visit      Cardiovascular and Mediastinum   Essential hypertension    Under good control on current regimen. Continue current regimen. Continue to monitor. Call with any concerns. Refills given. Labs drawn today.       Relevant Medications   lisinopril (ZESTRIL) 10 MG tablet   Other Relevant Orders   CBC with  Differential/Platelet   Comprehensive metabolic panel   Microalbumin, Urine Waived   TSH   Senile purpura ([place])    Reassured patient. Continue to monitor.       Relevant Medications   lisinopril (ZESTRIL) 10 MG tablet     Endocrine   DM type 2 with diabetic peripheral neuropathy ([place])    Under great control with A1c of 5.8. Continue current regimen. Continue to monitor. Call with any concerns.       Relevant Medications   lisinopril (ZESTRIL) 10 MG tablet   Other Relevant Orders   Bayer DCA Hb A1c Waived   CBC with Differential/Platelet   Comprehensive metabolic panel   Lipid Panel w/o Chol/HDL Ratio   Microalbumin, Urine Waived     Genitourinary   BPH (benign prostatic hyperplasia)    Under good control on current regimen. Continue current regimen. Continue to monitor. Call with any concerns. Labs drawn today.       Relevant Orders   CBC with Differential/Platelet   Comprehensive metabolic panel   UA/M w/rflx Culture, Routine   PSA     Other   Anemia    Rechecking labs today. Await results. Call with any concerns.       Relevant Orders   CBC with Differential/Platelet   Comprehensive metabolic panel   History of below-knee amputation of left lower extremity (HCC)    Stable. Doing well. No concerns.        Other Visit Diagnoses    Routine general medical examination at a health care facility    -  Primary   Vaccines up to date. Screening labs checked today. Colonoscopy up to date. Continue diet and exercise. Call with any concerns.  Discussed aspirin prophylaxis for myocardial infarction prevention and decision was made to continue ASA  LABORATORY TESTING:  Health maintenance labs ordered today as discussed above.   The natural history of prostate cancer and ongoing controversy regarding screening and potential treatment outcomes of prostate cancer has been discussed with the patient. The meaning of a false positive PSA and a false negative PSA has  been discussed. He indicates understanding of the limitations of this screening test and wishes to proceed with screening PSA testing.   IMMUNIZATIONS:   - Tdap: Tetanus vaccination status reviewed: last tetanus booster within 10 years. - Influenza: Up to date - Pneumovax: Up to date - Prevnar: Up to date - HPV: Up to date - Zostavax vaccine: Up to date  SCREENING: - Colonoscopy: Up to date  Discussed with patient purpose of the colonoscopy is to detect colon cancer at curable precancerous or early stages   PATIENT COUNSELING:    Sexuality: Discussed sexually transmitted diseases, partner selection, use of condoms, avoidance of unintended pregnancy  and contraceptive alternatives.   Advised to avoid cigarette smoking.  I discussed with the patient that most people either abstain from alcohol or drink within safe limits (<=14/week and <=4 drinks/occasion for males, <=7/weeks and <= 3 drinks/occasion for females) and that the risk for alcohol disorders and other health effects rises proportionally with the number of drinks per week and how often a drinker exceeds daily limits.  Discussed cessation/primary prevention of drug use and availability of treatment for abuse.   Diet: Encouraged to adjust caloric intake to maintain  or achieve ideal body weight, to reduce intake of dietary saturated fat and total fat, to limit sodium intake by avoiding high sodium foods and not adding table salt, and to maintain adequate dietary potassium and calcium preferably from fresh fruits, vegetables, and low-fat dairy products.    stressed the importance of regular exercise  Injury prevention: Discussed safety belts, safety helmets, smoke detector, smoking near bedding or upholstery.   Dental health: Discussed importance of regular tooth brushing, flossing, and dental visits.   Follow up plan: NEXT PREVENTATIVE PHYSICAL DUE IN 1 YEAR. Return in about 6 months (around 06/03/2020).

## 2019-12-03 NOTE — Assessment & Plan Note (Signed)
Stable. Doing well. No concerns.  ?

## 2019-12-03 NOTE — Assessment & Plan Note (Signed)
Under good control on current regimen. Continue current regimen. Continue to monitor. Call with any concerns. Labs drawn today.  

## 2019-12-03 NOTE — Assessment & Plan Note (Signed)
Under good control on current regimen. Continue current regimen. Continue to monitor. Call with any concerns. Refills given. Labs drawn today.   

## 2019-12-03 NOTE — Assessment & Plan Note (Signed)
Under great control with A1c of 5.8. Continue current regimen. Continue to monitor. Call with any concerns.

## 2019-12-03 NOTE — Assessment & Plan Note (Signed)
Reassured patient. Continue to monitor.  

## 2019-12-04 ENCOUNTER — Encounter: Payer: Self-pay | Admitting: Family Medicine

## 2019-12-04 LAB — COMPREHENSIVE METABOLIC PANEL
ALT: 14 IU/L (ref 0–44)
AST: 18 IU/L (ref 0–40)
Albumin/Globulin Ratio: 1.5 (ref 1.2–2.2)
Albumin: 4.5 g/dL (ref 3.8–4.8)
Alkaline Phosphatase: 60 IU/L (ref 39–117)
BUN/Creatinine Ratio: 24 (ref 10–24)
BUN: 27 mg/dL (ref 8–27)
Bilirubin Total: 0.3 mg/dL (ref 0.0–1.2)
CO2: 20 mmol/L (ref 20–29)
Calcium: 9.2 mg/dL (ref 8.6–10.2)
Chloride: 101 mmol/L (ref 96–106)
Creatinine, Ser: 1.12 mg/dL (ref 0.76–1.27)
GFR calc Af Amer: 77 mL/min/{1.73_m2} (ref >59)
GFR calc non Af Amer: 66 mL/min/{1.73_m2} (ref >59)
Globulin, Total: 3 g/dL (ref 1.5–4.5)
Glucose: 127 mg/dL — ABNORMAL HIGH (ref 65–99)
Potassium: 4.7 mmol/L (ref 3.5–5.2)
Sodium: 137 mmol/L (ref 134–144)
Total Protein: 7.5 g/dL (ref 6.0–8.5)

## 2019-12-04 LAB — CBC WITH DIFFERENTIAL/PLATELET
Basophils Absolute: 0.1 10*3/uL (ref 0.0–0.2)
Basos: 1 %
EOS (ABSOLUTE): 0.2 10*3/uL (ref 0.0–0.4)
Eos: 3 %
Hematocrit: 36.1 % — ABNORMAL LOW (ref 37.5–51.0)
Hemoglobin: 12.3 g/dL — ABNORMAL LOW (ref 13.0–17.7)
Immature Grans (Abs): 0.1 10*3/uL (ref 0.0–0.1)
Immature Granulocytes: 1 %
Lymphocytes Absolute: 1.8 10*3/uL (ref 0.7–3.1)
Lymphs: 24 %
MCH: 30.6 pg (ref 26.6–33.0)
MCHC: 34.1 g/dL (ref 31.5–35.7)
MCV: 90 fL (ref 79–97)
Monocytes Absolute: 0.7 10*3/uL (ref 0.1–0.9)
Monocytes: 9 %
Neutrophils Absolute: 4.8 10*3/uL (ref 1.4–7.0)
Neutrophils: 62 %
Platelets: 266 10*3/uL (ref 150–450)
RBC: 4.02 x10E6/uL — ABNORMAL LOW (ref 4.14–5.80)
RDW: 12.6 % (ref 11.6–15.4)
WBC: 7.7 10*3/uL (ref 3.4–10.8)

## 2019-12-04 LAB — LIPID PANEL W/O CHOL/HDL RATIO
Cholesterol, Total: 197 mg/dL (ref 100–199)
HDL: 33 mg/dL — ABNORMAL LOW (ref >39)
LDL Chol Calc (NIH): 110 mg/dL — ABNORMAL HIGH (ref 0–99)
Triglycerides: 315 mg/dL — ABNORMAL HIGH (ref 0–149)
VLDL Cholesterol Cal: 54 mg/dL — ABNORMAL HIGH (ref 5–40)

## 2019-12-04 LAB — PSA: Prostate Specific Ag, Serum: 0.9 ng/mL (ref 0.0–4.0)

## 2019-12-04 LAB — TSH: TSH: 0.911 u[IU]/mL (ref 0.450–4.500)

## 2019-12-12 DIAGNOSIS — H40023 Open angle with borderline findings, high risk, bilateral: Secondary | ICD-10-CM | POA: Diagnosis not present

## 2019-12-12 LAB — HM DIABETES EYE EXAM

## 2019-12-26 ENCOUNTER — Other Ambulatory Visit: Payer: Self-pay | Admitting: Orthopedic Surgery

## 2020-01-24 ENCOUNTER — Other Ambulatory Visit: Payer: Self-pay | Admitting: Orthopedic Surgery

## 2020-02-12 ENCOUNTER — Other Ambulatory Visit: Payer: Self-pay

## 2020-02-12 ENCOUNTER — Encounter: Payer: Self-pay | Admitting: Orthopedic Surgery

## 2020-02-12 ENCOUNTER — Ambulatory Visit: Payer: Medicare PPO | Admitting: Physician Assistant

## 2020-02-12 VITALS — Ht 74.02 in | Wt 263.4 lb

## 2020-02-12 DIAGNOSIS — Z89512 Acquired absence of left leg below knee: Secondary | ICD-10-CM

## 2020-02-12 NOTE — Progress Notes (Signed)
Office Visit Note   Patient: Daniel Leon           Date of Birth: 02-02-1951           MRN: [id number] Visit Date: 02/12/2020              Requested by: Valerie Roys, DO [place],  [place] [id number] PCP: Valerie Roys, DO  Chief Complaint  Patient presents with  . Left Leg - Follow-up    10/13/2018 Left BKA      HPI: This is a pleasant 69 year old gentleman who is 16 months status post left below-knee amputation his amputations socket is no longer fitting appropriately as the volume of his stump has decreased.  There have been modifications that they have tried to make but these have been unsuccessful.  He is currently wearing to 10 ply socks a 3 ply sock and a 1 ply sock despite this the socket feels unstable to him  Assessment & Plan: Visit Diagnoses: No diagnosis found.  Plan: Prescription was provided for a new socket and supplies.  Patient is an existing left transtibial  amputee.  Patient's current comorbidities are not expected to impact the ability to function with the prescribed prosthesis. Patient verbally communicates a strong desire to use a prosthesis. Patient currently requires mobility aids to ambulate without a prosthesis.  Expects not to use mobility aids with a new prosthesis.  Patient is a K3 level ambulator that spends a lot of time walking around on uneven terrain over obstacles, up and down stairs, and ambulates with a variable cadence.    Follow-Up Instructions: No follow-ups on file.   Ortho Exam  Patient is alert, oriented, no adenopathy, well-dressed, normal affect, normal respiratory effort. Exam of his below-knee amputation stump demonstrates well-healed surgical incision no cellulitis excellent knee range of motion significant decrease in volume and compared to the socket that he currently has   Imaging: No results found. No images are attached to the encounter.  Labs: Lab Results  Component Value Date   HGBA1C 5.8  12/03/2019   HGBA1C 5.5 10/03/2019   HGBA1C 5.7 (H) 10/13/2018     Lab Results  Component Value Date   ALBUMIN 4.5 12/03/2019   ALBUMIN 4.2 10/03/2019   ALBUMIN 4.0 01/26/2018    No results found for: MG No results found for: VD25OH  No results found for: PREALBUMIN CBC EXTENDED Latest Ref Rng & Units 12/03/2019 10/03/2019 10/13/2018  WBC 3.4 - 10.8 x10E3/uL 7.7 6.6 5.7  RBC 4.14 - 5.80 x10E6/uL 4.02(L) 4.10(L) 3.97(L)  HGB 13.0 - 17.7 g/dL 12.3(L) 12.8(L) 11.3(L)  HCT 37.5 - 51.0 % 36.1(L) 37.2(L) 35.1(L)  PLT 150 - 450 x10E3/uL 266 280 256  NEUTROABS 1 - 7 x10E3/uL 4.8 4.2 -  LYMPHSABS 0 - 3 x10E3/uL 1.8 1.4 -     Body mass index is 33.8 kg/m.  Orders:  No orders of the defined types were placed in this encounter.  No orders of the defined types were placed in this encounter.    Procedures: No procedures performed  Clinical Data: No additional findings.  ROS:  All other systems negative, except as noted in the HPI. Review of Systems  Objective: Vital Signs: Ht 6' 2.02" (1.88 m)   Wt 263 lb 6.4 oz (119.5 kg)   BMI 33.80 kg/m   Specialty Comments:  No specialty comments available.  PMFS History: Patient Active Problem List   Diagnosis Date Noted  . Senile purpura (  HCC) 12/03/2019  . History of below-knee amputation of left lower extremity (HCC) 10/13/2018  . Charcot foot due to diabetes mellitus (HCC) 01/26/2018  . Neuropathy 12/29/2016  . BPH (benign prostatic hyperplasia) 12/29/2016  . Advanced care planning/counseling discussion 12/29/2016  . DM type 2 with diabetic peripheral neuropathy (HCC) 06/30/2016  . Anemia 07/28/2015  . Essential hypertension    Past Medical History:  Diagnosis Date  . 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 Charcot ankle, left    collapse  . Charcot's joint of foot   . Diabetes mellitus without complication (HCC)    DIET-CONTROLLED  . Early cataracts, bilateral   . Essential hypertension   .  Right bundle branch block (RBBB)   . Traumatic arthropathy of ankle and foot   . Wears glasses     Family History  Problem Relation Age of Onset  . Heart disease Mother   . Hypertension Mother   . Heart disease Father   . Hypertension Father   . Hypertension Brother   . Prostate cancer Neg Hx   . Bladder Cancer Neg Hx   . Kidney cancer Neg Hx     Past Surgical History:  Procedure Laterality Date  . AMPUTATION Left 10/13/2018   Procedure: LEFT BELOW KNEE AMPUTATION;  Surgeon: [name], MD;  Location: [facility] OR;  Service: Orthopedics;  Laterality: Left;  . COLONOSCOPY    . ELBOW SURGERY Right 1966  . EYE SURGERY  03/2019   cataracts- [place]   . EYE SURGERY  02/2019   cataracts - [place]  . FOOT ARTHRODESIS, SUBTALAR Right 2019  . TONSILLECTOMY    . TOTAL ANKLE REPLACEMENT    . ULNAR NERVE TRANSPOSITION Right 09/05/2015   Procedure: ULNAR NERVE DECOMPRESSION/TRANSPOSITION;  Surgeon: [name], MD;  Location: [facility] ORS;  Service: Orthopedics;  Laterality: Right;   Social History   Occupational History  . Not on file  Tobacco Use  . Smoking status: Former Smoker    Packs/day: 0.50    Years: 10.00    Pack years: 5.00    Types: Cigarettes    Quit date: 07/27/2000    Years since quitting: 19.5  . Smokeless tobacco: Never Used  Vaping Use  . Vaping Use: Never used  Substance and Sexual Activity  . Alcohol use: Yes    Comment: very little  . Drug use: No  . Sexual activity: Not on file

## 2020-02-22 ENCOUNTER — Other Ambulatory Visit: Payer: Self-pay | Admitting: Orthopedic Surgery

## 2020-03-02 ENCOUNTER — Other Ambulatory Visit: Payer: Self-pay | Admitting: Family Medicine

## 2020-03-02 DIAGNOSIS — I10 Essential (primary) hypertension: Secondary | ICD-10-CM

## 2020-03-02 NOTE — Telephone Encounter (Signed)
Requested Prescriptions  Pending Prescriptions Disp Refills  . lisinopril (ZESTRIL) 10 MG tablet [Pharmacy Med Name: LISINOPRIL 10MG  TABLETS] 135 tablet 0    Sig: TAKE 1 AND 1/2 TABLETS(15 MG) BY MOUTH DAILY     Cardiovascular:  ACE Inhibitors Passed - 03/02/2020  8:09 AM      Passed - Cr in normal range and within 180 days    Creatinine, Ser  Date Value Ref Range Status  12/03/2019 1.12 0.76 - 1.27 mg/dL Final         Passed - K in normal range and within 180 days    Potassium  Date Value Ref Range Status  12/03/2019 4.7 3.5 - 5.2 mmol/L Final         Passed - Patient is not pregnant      Passed - Last BP in normal range    BP Readings from Last 1 Encounters:  12/03/19 128/64         Passed - Valid encounter within last 6 months    Recent Outpatient Visits          3 months ago Routine general medical examination at a health care facility   [facility], Megan P, DO   5 months ago DM type 2 with diabetic peripheral neuropathy [facility])   [facility], Megan P, DO   1 year ago Essential hypertension   [facility] Crissman, [place] REM[place], MD   2 years ago Essential hypertension   [facility] Crissman, Redge Gainer, MD   2 years ago Essential hypertension   [facility] Crissman, Redge Gainer, MD      Future Appointments            In 3 months Johnson, Redge Gainer, DO [facility]   In 6 months  [facility]

## 2020-03-24 ENCOUNTER — Other Ambulatory Visit: Payer: Self-pay | Admitting: Orthopedic Surgery

## 2020-03-24 NOTE — Telephone Encounter (Signed)
Pls advise.  

## 2020-04-21 ENCOUNTER — Other Ambulatory Visit: Payer: Self-pay | Admitting: Physician Assistant

## 2020-05-13 DIAGNOSIS — Z89512 Acquired absence of left leg below knee: Secondary | ICD-10-CM | POA: Diagnosis not present

## 2020-05-21 ENCOUNTER — Ambulatory Visit: Payer: Medicare PPO

## 2020-05-21 ENCOUNTER — Ambulatory Visit (INDEPENDENT_AMBULATORY_CARE_PROVIDER_SITE_OTHER): Payer: Medicare PPO

## 2020-05-21 ENCOUNTER — Other Ambulatory Visit: Payer: Self-pay

## 2020-05-21 DIAGNOSIS — Z23 Encounter for immunization: Secondary | ICD-10-CM | POA: Diagnosis not present

## 2020-05-31 ENCOUNTER — Other Ambulatory Visit: Payer: Self-pay | Admitting: Family Medicine

## 2020-05-31 DIAGNOSIS — I10 Essential (primary) hypertension: Secondary | ICD-10-CM

## 2020-05-31 NOTE — Telephone Encounter (Signed)
Pt has appt 06/19/20

## 2020-06-02 ENCOUNTER — Other Ambulatory Visit: Payer: Self-pay | Admitting: Physician Assistant

## 2020-06-09 ENCOUNTER — Other Ambulatory Visit: Payer: Self-pay | Admitting: Physician Assistant

## 2020-06-19 ENCOUNTER — Encounter: Payer: Self-pay | Admitting: Family Medicine

## 2020-06-19 ENCOUNTER — Other Ambulatory Visit: Payer: Self-pay

## 2020-06-19 ENCOUNTER — Ambulatory Visit: Payer: Medicare PPO | Admitting: Family Medicine

## 2020-06-19 VITALS — BP 116/77 | HR 85 | Temp 98.1°F | Ht 74.02 in | Wt 267.0 lb

## 2020-06-19 DIAGNOSIS — D649 Anemia, unspecified: Secondary | ICD-10-CM

## 2020-06-19 DIAGNOSIS — I10 Essential (primary) hypertension: Secondary | ICD-10-CM

## 2020-06-19 DIAGNOSIS — E1142 Type 2 diabetes mellitus with diabetic polyneuropathy: Secondary | ICD-10-CM | POA: Diagnosis not present

## 2020-06-19 LAB — URINALYSIS, ROUTINE W REFLEX MICROSCOPIC
Bilirubin, UA: NEGATIVE
Glucose, UA: NEGATIVE
Ketones, UA: NEGATIVE
Leukocytes,UA: NEGATIVE
Nitrite, UA: NEGATIVE
Protein,UA: NEGATIVE
RBC, UA: NEGATIVE
Specific Gravity, UA: 1.02 (ref 1.005–1.030)
Urobilinogen, Ur: 0.2 mg/dL (ref 0.2–1.0)
pH, UA: 5 (ref 5.0–7.5)

## 2020-06-19 LAB — BAYER DCA HB A1C WAIVED: HB A1C (BAYER DCA - WAIVED): 5.8 % (ref <7.0)

## 2020-06-19 LAB — MICROALBUMIN, URINE WAIVED
Creatinine, Urine Waived: 100 mg/dL (ref 10–300)
Microalb, Ur Waived: 30 mg/L — ABNORMAL HIGH (ref 0–19)
Microalb/Creat Ratio: <30 mg/g (ref <30)

## 2020-06-19 MED ORDER — GABAPENTIN 400 MG PO CAPS: ORAL_CAPSULE | ORAL | 1 refills | Status: DC

## 2020-06-19 MED ORDER — LISINOPRIL 10 MG PO TABS: 15.0000 mg | ORAL_TABLET | Freq: Every day | ORAL | 1 refills | Status: DC

## 2020-06-19 NOTE — Assessment & Plan Note (Addendum)
Rechecking labs today. Await results. Treat as needed.  °

## 2020-06-19 NOTE — Progress Notes (Signed)
BP 116/77   Pulse 85   Temp 98.1 F (36.7 C) (Oral)   Ht 6' 2.02" (1.88 m)   Wt 267 lb (121.1 kg)   SpO2 95%   BMI 34.26 kg/m    Subjective:    Patient ID: Daniel Leon, male    DOB: [birth year], 69 y.o.   MRN: [id number]  HPI: Daniel Leon is a 69 y.o. male  Chief Complaint  Patient presents with  . Diabetes    T2DM follow up.   HYPERTENSION Hypertension status: controlled  Satisfied with current treatment? yes Duration of hypertension: chronic BP monitoring frequency:  not checking BP medication side effects:  no Medication compliance: excellent compliance Previous BP meds: lisinopril Aspirin: yes Recurrent headaches: no Visual changes: no Palpitations: no Dyspnea: no Chest pain: no Lower extremity edema: no Dizzy/lightheaded: no  DIABETES Hypoglycemic episodes:no Polydipsia/polyuria: no Visual disturbance: no Chest pain: no Paresthesias: no Glucose Monitoring: occasionally  Accucheck frequency: occasionally Taking Insulin?: no Blood Pressure Monitoring: not checking Retinal Examination: Up to Date Foot Exam: Up to Date Diabetic Education: Completed Pneumovax: Up to Date Influenza: Up to Date Aspirin: yes  Relevant past medical, surgical, family and social history reviewed and updated as indicated. Interim medical history since our last visit reviewed. Allergies and medications reviewed and updated.  Review of Systems  Constitutional: Negative.   Respiratory: Negative.   Gastrointestinal: Negative.   Musculoskeletal: Negative.   Neurological: Negative.   Psychiatric/Behavioral: Negative.     Per HPI unless specifically indicated above     Objective:    BP 116/77   Pulse 85   Temp 98.1 F (36.7 C) (Oral)   Ht 6' 2.02" (1.88 m)   Wt 267 lb (121.1 kg)   SpO2 95%   BMI 34.26 kg/m   Wt Readings from Last 3 Encounters:  06/19/20 267 lb (121.1 kg)  02/12/20 263 lb 6.4 oz (119.5 kg)  12/03/19 263 lb 6.4 oz (119.5 kg)      Physical Exam Vitals and nursing note reviewed.  Constitutional:      General: He is not in acute distress.    Appearance: Normal appearance. He is not ill-appearing, toxic-appearing or diaphoretic.  HENT:     Head: Normocephalic and atraumatic.     Right Ear: External ear normal.     Left Ear: External ear normal.     Nose: Nose normal.     Mouth/Throat:     Mouth: Mucous membranes are moist.     Pharynx: Oropharynx is clear.  Eyes:     General: No scleral icterus.       Right eye: No discharge.        Left eye: No discharge.     Extraocular Movements: Extraocular movements intact.     Conjunctiva/sclera: Conjunctivae normal.     Pupils: Pupils are equal, round, and reactive to light.  Cardiovascular:     Rate and Rhythm: Normal rate and regular rhythm.     Pulses: Normal pulses.     Heart sounds: Normal heart sounds. No murmur heard.  No friction rub. No gallop.   Pulmonary:     Effort: Pulmonary effort is normal. No respiratory distress.     Breath sounds: Normal breath sounds. No stridor. No wheezing, rhonchi or rales.  Chest:     Chest wall: No tenderness.  Musculoskeletal:        General: Normal range of motion.     Cervical back: Normal range of motion and neck supple.  Comments: S/p BKA on L with prosthesis  Skin:    General: Skin is warm and dry.     Capillary Refill: Capillary refill takes less than 2 seconds.     Coloration: Skin is not jaundiced or pale.     Findings: No bruising, erythema, lesion or rash.  Neurological:     General: No focal deficit present.     Mental Status: He is alert and oriented to person, place, and time. Mental status is at baseline.  Psychiatric:        Mood and Affect: Mood normal.        Behavior: Behavior normal.        Thought Content: Thought content normal.        Judgment: Judgment normal.     Results for orders placed or performed in visit on 12/03/19  Bayer DCA Hb A1c Waived  Result Value Ref Range   HB A1C  (BAYER DCA - WAIVED) 5.8 <7.0 %  CBC with Differential/Platelet  Result Value Ref Range   WBC 7.7 3.4 - 10.8 x10E3/uL   RBC 4.02 (L) 4.14 - 5.80 x10E6/uL   Hemoglobin 12.3 (L) 13.0 - 17.7 g/dL   Hematocrit 08.6 (L) 57.8 - 51.0 %   MCV 90 79 - 97 fL   MCH 30.6 26.6 - 33.0 pg   MCHC 34.1 31 - 35 g/dL   RDW 46.9 62.9 - 52.8 %   Platelets 266 150 - 450 x10E3/uL   Neutrophils 62 Not Estab. %   Lymphs 24 Not Estab. %   Monocytes 9 Not Estab. %   Eos 3 Not Estab. %   Basos 1 Not Estab. %   Neutrophils Absolute 4.8 1.40 - 7.00 x10E3/uL   Lymphocytes Absolute 1.8 0 - 3 x10E3/uL   Monocytes Absolute 0.7 0 - 0 x10E3/uL   EOS (ABSOLUTE) 0.2 0.0 - 0.4 x10E3/uL   Basophils Absolute 0.1 0 - 0 x10E3/uL   Immature Granulocytes 1 Not Estab. %   Immature Grans (Abs) 0.1 0.0 - 0.1 x10E3/uL  Comprehensive metabolic panel  Result Value Ref Range   Glucose 127 (H) 65 - 99 mg/dL   BUN 27 8 - 27 mg/dL   Creatinine, Ser 4.13 0.76 - 1.27 mg/dL   GFR calc non Af Amer 66 >59 mL/min/1.73   GFR calc Af Amer 77 >59 mL/min/1.73   BUN/Creatinine Ratio 24 10 - 24   Sodium 137 134 - 144 mmol/L   Potassium 4.7 3.5 - 5.2 mmol/L   Chloride 101 96 - 106 mmol/L   CO2 20 20 - 29 mmol/L   Calcium 9.2 8.6 - 10.2 mg/dL   Total Protein 7.5 6.0 - 8.5 g/dL   Albumin 4.5 3.8 - 4.8 g/dL   Globulin, Total 3.0 1.5 - 4.5 g/dL   Albumin/Globulin Ratio 1.5 1.2 - 2.2   Bilirubin Total 0.3 0.0 - 1.2 mg/dL   Alkaline Phosphatase 60 39 - 117 IU/L   AST 18 0 - 40 IU/L   ALT 14 0 - 44 IU/L  Lipid Panel w/o Chol/HDL Ratio  Result Value Ref Range   Cholesterol, Total 197 100 - 199 mg/dL   Triglycerides 244 (H) 0 - 149 mg/dL   HDL 33 (L) >01 mg/dL   VLDL Cholesterol Cal 54 (H) 5 - 40 mg/dL   LDL Chol Calc (NIH) 027 (H) 0 - 99 mg/dL  Microalbumin, Urine Waived  Result Value Ref Range   Microalb, Ur Waived 10 0 - 19 mg/L  Creatinine, Urine Waived 50 10 - 300 mg/dL   Microalb/Creat Ratio 30-300 (H) <30 mg/g  TSH  Result  Value Ref Range   TSH 0.911 0.450 - 4.500 uIU/mL  UA/M w/rflx Culture, Routine   Specimen: Blood   BLD  Result Value Ref Range   Specific Gravity, UA 1.015 1.005 - 1.030   pH, UA 5.0 5.0 - 7.5   Color, UA Yellow Yellow   Appearance Ur Clear Clear   Leukocytes,UA Negative Negative   Protein,UA Negative Negative/Trace   Glucose, UA Negative Negative   Ketones, UA Negative Negative   RBC, UA Negative Negative   Bilirubin, UA Negative Negative   Urobilinogen, Ur 0.2 0.2 - 1.0 mg/dL   Nitrite, UA Negative Negative  PSA  Result Value Ref Range   Prostate Specific Ag, Serum 0.9 0.0 - 4.0 ng/mL      Assessment & Plan:   Problem List Items Addressed This Visit      Cardiovascular and Mediastinum   Essential hypertension    Under good control on current regimen. Continue current regimen. Continue to monitor. Call with any concerns. Refills given. Labs drawn today.       Relevant Medications   lisinopril (ZESTRIL) 10 MG tablet   Other Relevant Orders   Comprehensive metabolic panel   Microalbumin, Urine Waived     Endocrine   DM type 2 with diabetic peripheral neuropathy (HCC) - Primary    Doing great with A1c of 5.8 off medicine. Continue diet and exercise. Call with any concerns. Labs drawn today.      Relevant Medications   gabapentin (NEURONTIN) 400 MG capsule   lisinopril (ZESTRIL) 10 MG tablet   Other Relevant Orders   Bayer DCA Hb A1c Waived   Comprehensive metabolic panel   Microalbumin, Urine Waived   Urinalysis, Routine w reflex microscopic   Lipid Panel w/o Chol/HDL Ratio     Other   Anemia    Rechecking labs today. Await results. Treat as needed.       Relevant Orders   Comprehensive metabolic panel   CBC with Differential/Platelet       Follow up plan: Return in about 6 months (around 12/18/2020) for physical.

## 2020-06-19 NOTE — Assessment & Plan Note (Signed)
Under good control on current regimen. Continue current regimen. Continue to monitor. Call with any concerns. Refills given. Labs drawn today.   

## 2020-06-19 NOTE — Assessment & Plan Note (Signed)
Doing great with A1c of 5.8 off medicine. Continue diet and exercise. Call with any concerns. Labs drawn today.

## 2020-06-20 ENCOUNTER — Encounter: Payer: Self-pay | Admitting: Family Medicine

## 2020-06-20 LAB — CBC WITH DIFFERENTIAL/PLATELET
Basophils Absolute: 0.1 10*3/uL (ref 0.0–0.2)
Basos: 1 %
EOS (ABSOLUTE): 0.1 10*3/uL (ref 0.0–0.4)
Eos: 2 %
Hematocrit: 35.7 % — ABNORMAL LOW (ref 37.5–51.0)
Hemoglobin: 12 g/dL — ABNORMAL LOW (ref 13.0–17.7)
Immature Grans (Abs): 0.1 10*3/uL (ref 0.0–0.1)
Immature Granulocytes: 1 %
Lymphocytes Absolute: 1.7 10*3/uL (ref 0.7–3.1)
Lymphs: 26 %
MCH: 30.4 pg (ref 26.6–33.0)
MCHC: 33.6 g/dL (ref 31.5–35.7)
MCV: 90 fL (ref 79–97)
Monocytes Absolute: 0.8 10*3/uL (ref 0.1–0.9)
Monocytes: 12 %
Neutrophils Absolute: 3.7 10*3/uL (ref 1.4–7.0)
Neutrophils: 58 %
Platelets: 297 10*3/uL (ref 150–450)
RBC: 3.95 x10E6/uL — ABNORMAL LOW (ref 4.14–5.80)
RDW: 12.6 % (ref 11.6–15.4)
WBC: 6.5 10*3/uL (ref 3.4–10.8)

## 2020-06-20 LAB — LIPID PANEL W/O CHOL/HDL RATIO
Cholesterol, Total: 176 mg/dL (ref 100–199)
HDL: 35 mg/dL — ABNORMAL LOW (ref >39)
LDL Chol Calc (NIH): 110 mg/dL — ABNORMAL HIGH (ref 0–99)
Triglycerides: 173 mg/dL — ABNORMAL HIGH (ref 0–149)
VLDL Cholesterol Cal: 31 mg/dL (ref 5–40)

## 2020-06-20 LAB — COMPREHENSIVE METABOLIC PANEL
ALT: 14 IU/L (ref 0–44)
AST: 20 IU/L (ref 0–40)
Albumin/Globulin Ratio: 1.4 (ref 1.2–2.2)
Albumin: 4.3 g/dL (ref 3.8–4.8)
Alkaline Phosphatase: 63 IU/L (ref 44–121)
BUN/Creatinine Ratio: 16 (ref 10–24)
BUN: 19 mg/dL (ref 8–27)
Bilirubin Total: 0.3 mg/dL (ref 0.0–1.2)
CO2: 22 mmol/L (ref 20–29)
Calcium: 9.5 mg/dL (ref 8.6–10.2)
Chloride: 104 mmol/L (ref 96–106)
Creatinine, Ser: 1.19 mg/dL (ref 0.76–1.27)
GFR calc Af Amer: 71 mL/min/{1.73_m2} (ref >59)
GFR calc non Af Amer: 62 mL/min/{1.73_m2} (ref >59)
Globulin, Total: 3 g/dL (ref 1.5–4.5)
Glucose: 84 mg/dL (ref 65–99)
Potassium: 5.1 mmol/L (ref 3.5–5.2)
Sodium: 139 mmol/L (ref 134–144)
Total Protein: 7.3 g/dL (ref 6.0–8.5)

## 2020-07-23 ENCOUNTER — Telehealth: Payer: Self-pay

## 2020-07-23 ENCOUNTER — Other Ambulatory Visit: Payer: Self-pay | Admitting: Family Medicine

## 2020-07-23 DIAGNOSIS — I10 Essential (primary) hypertension: Secondary | ICD-10-CM

## 2020-07-23 MED ORDER — GABAPENTIN 400 MG PO CAPS
ORAL_CAPSULE | ORAL | 1 refills | Status: DC
Start: 2020-07-23 — End: 2021-01-08

## 2020-07-23 MED ORDER — LISINOPRIL 10 MG PO TABS: 15.0000 mg | ORAL_TABLET | Freq: Every day | ORAL | 1 refills | Status: DC

## 2020-07-23 NOTE — Telephone Encounter (Signed)
PT need a refill  isinopril (ZESTRIL) 10 MG tablet [[id number]]  gabapentin (NEURONTIN) 400 MG capsule [[id number]]  [id number] Nicholes Rough, [place] - [address] AT [facility] [address]  [address] [place] [address]  Phone: [phone] Fax: [phone]

## 2020-07-23 NOTE — Telephone Encounter (Signed)
Copied from CRM [id number]. Topic: Quick Communication - Rx Refill/Question >> Jul 23, 2020  9:40 AM Lyn Hollingshead D wrote: PT requesting a 6 months supply / she spoke with Aundra Millet J already  gabapentin (NEURONTIN) 400 MG capsule [[id number]] / lisinopril (ZESTRIL) 10 MG tablet [[id number]]

## 2020-07-23 NOTE — Telephone Encounter (Signed)
Medication sent to patient's pharmacy.

## 2020-09-22 ENCOUNTER — Ambulatory Visit (INDEPENDENT_AMBULATORY_CARE_PROVIDER_SITE_OTHER): Payer: Medicare PPO

## 2020-09-22 VITALS — Ht 76.0 in | Wt 250.0 lb

## 2020-09-22 DIAGNOSIS — Z0000 Encounter for general adult medical examination without abnormal findings: Secondary | ICD-10-CM | POA: Diagnosis not present

## 2020-09-22 NOTE — Progress Notes (Signed)
I connected with [name] today by telephone and verified that I am speaking with the correct person using two identifiers. Location patient: home Location provider: work Persons participating in the virtual visit: [name], [name] LPN.   I discussed the limitations, risks, security and privacy concerns of performing an evaluation and management service by telephone and the availability of in person appointments. I also discussed with the patient that there may be a patient responsible charge related to this service. The patient expressed understanding and verbally consented to this telephonic visit.    Interactive audio and video telecommunications were attempted between this provider and patient, however failed, due to patient having technical difficulties OR patient did not have access to video capability.  We continued and completed visit with audio only.     Vital signs may be patient reported or missing.  Subjective:   Daniel Leon is a 70 y.o. male who presents for Medicare Annual/Subsequent preventive examination.  Review of Systems     Cardiac Risk Factors include: advanced age (>27men, >31 women);diabetes mellitus;hypertension;male gender;obesity (BMI >30kg/m2)     Objective:    Today's Vitals   09/22/20 1511  Weight: 250 lb (113.4 kg)  Height: 6\' 4"  (1.93 m)   Body mass index is 30.43 kg/m.  Advanced Directives 09/22/2020 09/20/2019 01/04/2019 10/15/2018 10/13/2018 10/13/2018 04/20/2018  Does Patient Have a Medical Advance Directive? Yes Yes Yes - Yes Yes Yes  Type of Advance Directive Healthcare Power of [place];Living will Living will;Healthcare Power of [place] Power of [place];Living will - - Healthcare Power of [place];Living will Healthcare Power of [place];Living will  Does patient want to make changes to medical advance directive? - - - No - Patient declined - - -  Copy of Healthcare Power of Attorney in Chart? No - copy  requested No - copy requested No - copy requested - - No - copy requested No - copy requested    Current Medications (verified) Outpatient Encounter Medications as of 09/22/2020  Medication Sig  . aspirin EC 81 MG tablet Take 81 mg by mouth daily.   . Cholecalciferol (VITAMIN D3) 125 MCG (5000 UT) CAPS Take 5,000 Units by mouth daily.  . diclofenac sodium (VOLTAREN) 1 % GEL Apply 1 application topically 4 (four) times daily as needed (pain).  09/24/2020 gabapentin (NEURONTIN) 400 MG capsule TAKE 1 CAPSULE BY MOUTH TWICE DAILY. MAY TAKE A THIRD CAPSULE AS NEEDED FOR PAIN  . lisinopril (ZESTRIL) 10 MG tablet Take 1.5 tablets (15 mg total) by mouth daily.  . Multiple Vitamin (MULTIVITAMIN) tablet Take 1 tablet by mouth daily.   No facility-administered encounter medications on file as of 09/22/2020.    Allergies (verified) Oxycodone   History: Past Medical History:  Diagnosis Date  . 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 Charcot ankle, left    collapse  . Charcot's joint of foot   . Diabetes mellitus without complication (HCC)    DIET-CONTROLLED  . Early cataracts, bilateral   . Essential hypertension   . Right bundle branch block (RBBB)   . Traumatic arthropathy of ankle and foot   . Wears glasses    Past Surgical History:  Procedure Laterality Date  . AMPUTATION Left 10/13/2018   Procedure: LEFT BELOW KNEE AMPUTATION;  Surgeon: 10/15/2018, MD;  Location[facility] OR;  Service: Orthopedics;  Laterality: Left;  . COLONOSCOPY    . ELBOW SURGERY Right 1966  . EYE SURGERY  03/2019   cataracts-  [place]   . EYE SURGERY  02/2019   cataracts - [place]  . FOOT ARTHRODESIS, SUBTALAR Right 2019  . TONSILLECTOMY    . TOTAL ANKLE REPLACEMENT    . ULNAR NERVE TRANSPOSITION Right 09/05/2015   Procedure: ULNAR NERVE DECOMPRESSION/TRANSPOSITION;  Surgeon: [name], MD;  Location: [facility] ORS;  Service: Orthopedics;  Laterality: Right;   Family History  Problem Relation  Age of Onset  . Heart disease Mother   . Hypertension Mother   . Heart disease Father   . Hypertension Father   . Hypertension Brother   . Prostate cancer Neg Hx   . Bladder Cancer Neg Hx   . Kidney cancer Neg Hx    Social History   Socioeconomic History  . Marital status: Married    Spouse name: Not on file  . Number of children: Not on file  . Years of education: college   . Highest education level: Not on file  Occupational History  . Not on file  Tobacco Use  . Smoking status: Former Smoker    Packs/day: 0.50    Years: 10.00    Pack years: 5.00    Types: Cigarettes    Quit date: 07/27/2000    Years since quitting: 20.1  . Smokeless tobacco: Never Used  Vaping Use  . Vaping Use: Never used  Substance and Sexual Activity  . Alcohol use: Yes    Comment: very little  . Drug use: No  . Sexual activity: Not on file  Other Topics Concern  . Not on file  Social History Narrative  . Not on file   Social Determinants of Health   Financial Resource Strain: Low Risk   . Difficulty of Paying Living Expenses: Not hard at all  Food Insecurity: No Food Insecurity  . Worried About Programme researcher, broadcasting/film/video in the Last Year: Never true  . Ran Out of Food in the Last Year: Never true  Transportation Needs: No Transportation Needs  . Lack of Transportation (Medical): No  . Lack of Transportation (Non-Medical): No  Physical Activity: Sufficiently Active  . Days of Exercise per Week: 5 days  . Minutes of Exercise per Session: 30 min  Stress: No Stress Concern Present  . Feeling of Stress : Not at all  Social Connections: Not on file    Tobacco Counseling Counseling given: Not Answered   Clinical Intake:  Pre-visit preparation completed: Yes  Pain : No/denies pain     Nutritional Status: BMI > 30  Obese Nutritional Risks: None Diabetes: Yes  How often do you need to have someone help you when you read instructions, pamphlets, or other written materials from your  doctor or pharmacy?: 1 - Never What is the last grade level you completed in school?: 73yrs college  Diabetic? Yes Nutrition Risk Assessment:  Has the patient had any N/V/D within the last 2 months?  No  Does the patient have any non-healing wounds?  No  Has the patient had any unintentional weight loss or weight gain?  No   Diabetes:  Is the patient diabetic?  Yes  If diabetic, was a CBG obtained today?  No  Did the patient bring in their glucometer from home?  No  How often do you monitor your CBG's? Does not.   Financial Strains and Diabetes Management:  Are you having any financial strains with the device, your supplies or your medication? No .  Does the patient want to be seen by Chronic Care Management for management  of their diabetes?  No  Would the patient like to be referred to a Nutritionist or for Diabetic Management?  No   Diabetic Exams:  Diabetic Eye Exam: Completed 12/12/2019 Diabetic Foot Exam: Completed 12/03/2019   Interpreter Needed?: No  Information entered by :: NAllen LPN   Activities of Daily Living In your present state of health, do you have any difficulty performing the following activities: 09/22/2020  Hearing? N  Vision? N  Difficulty concentrating or making decisions? N  Walking or climbing stairs? N  Dressing or bathing? N  Doing errands, shopping? N  Preparing Food and eating ? N  Using the Toilet? N  In the past six months, have you accidently leaked urine? N  Do you have problems with loss of bowel control? N  Managing your Medications? N  Managing your Finances? N  Housekeeping or managing your Housekeeping? N  Some recent data might be hidden    Patient Care Team: Dorcas Carrow, DO as PCP - General (Family Medicine) Winfred Leeds, Basilia Jumbo, MD as Referring Physician (Orthopedic Surgery) Gordan Payment, DO as Referring Physician (Physical Medicine and Rehabilitation)  Indicate any recent Medical Services you may have received from  other than [facility] providers in the past year (date may be approximate).     Assessment:   This is a routine wellness examination for [place].  Hearing/Vision screen  Hearing Screening   125Hz  250Hz  500Hz  1000Hz  2000Hz  3000Hz  4000Hz  6000Hz  8000Hz   Right ear:           Left ear:           Vision Screening Comments: Regular eye exams, [facility]  Dietary issues and exercise activities discussed: Current Exercise Habits: Home exercise routine, Type of exercise: walking, Time (Minutes): 30, Frequency (Times/Week): 5, Weekly Exercise (Minutes/Week): 150  Goals    . DIET - INCREASE WATER INTAKE     Recommend drinking at least 6-8 glasses of water a day     . Patient Stated     09/22/2020, wants to weigh 230 pounds      Depression Screen PHQ 2/9 Scores 09/22/2020 06/19/2020 12/03/2019 12/03/2019 09/20/2019 04/20/2018 04/07/2017  PHQ - 2 Score 0 0 0 0 0 0 0  PHQ- 9 Score - 0 0 0 - - -    Fall Risk Fall Risk  09/22/2020 06/19/2020 12/03/2019 09/20/2019 04/20/2018  Falls in the past year? 0 0 0 0 No  Number falls in past yr: - 0 0 0 -  Injury with Fall? - 0 0 0 -  Risk for fall due to : Medication side effect No Fall Risks - - -  Follow up Falls evaluation completed;Education provided;Falls prevention discussed Falls evaluation completed - - -    FALL RISK PREVENTION PERTAINING TO THE HOME:  Any stairs in or around the home? No  If so, are there any without handrails? n/a Home free of loose throw rugs in walkways, pet beds, electrical cords, etc? Yes  Adequate lighting in your home to reduce risk of falls? Yes   ASSISTIVE DEVICES UTILIZED TO PREVENT FALLS:  Life alert? No  Use of a cane, walker or w/c? No  Grab bars in the bathroom? No  Shower chair or bench in shower? Yes  Elevated toilet seat or a handicapped toilet? Yes   TIMED UP AND GO:  Was the test performed? No . .  Cognitive Function:     6CIT Screen 09/22/2020 09/20/2019 04/20/2018  What Year? 0 points 0  points 0  points  What month? 0 points 0 points 0 points  What time? 0 points 0 points 0 points  Count back from 20 0 points 0 points 0 points  Months in reverse 0 points 0 points 0 points  Repeat phrase 2 points 0 points 2 points  Total Score 2 0 2    Immunizations Immunization History  Administered Date(s) Administered  . Fluad Quad(high Dose 65+) 05/17/2019, 05/21/2020  . Influenza, High Dose Seasonal PF 06/30/2016  . Influenza,inj,Quad PF,6+ Mos 07/28/2015  . Influenza-Unspecified 06/22/2018  . PFIZER(Purple Top)SARS-COV-2 Vaccination 09/13/2019, 10/04/2019, 06/17/2020  . Pneumococcal Conjugate-13 12/17/2014  . Pneumococcal Polysaccharide-23 06/30/2016  . Tdap 07/25/2012  . Zoster 06/12/2013  . Zoster Recombinat (Shingrix) 01/16/2020, 04/24/2020    TDAP status: Up to date  Flu Vaccine status: Up to date  Pneumococcal vaccine status: Up to date  Covid-19 vaccine status: Completed vaccines  Qualifies for Shingles Vaccine? Yes   Zostavax completed Yes   Shingrix Completed?: Yes  Screening Tests Health Maintenance  Topic Date Due  . FOOT EXAM  12/02/2020  . OPHTHALMOLOGY EXAM  12/11/2020  . HEMOGLOBIN A1C  12/18/2020  . TETANUS/TDAP  07/25/2022  . COLONOSCOPY (Pts 45-60yrs Insurance coverage will need to be confirmed)  12/13/2022  . INFLUENZA VACCINE  Completed  . COVID-19 Vaccine  Completed  . Hepatitis C Screening  Completed  . PNA vac Low Risk Adult  Completed    Health Maintenance  There are no preventive care reminders to display for this patient.  Colorectal cancer screening: Type of screening: Colonoscopy. Completed 12/12/2012. Repeat every 10 years  Lung Cancer Screening: (Low Dose CT Chest recommended if Age 48-80 years, 30 pack-year currently smoking OR have quit w/in 15years.) does not qualify.   Lung Cancer Screening Referral: no  Additional Screening:  Hepatitis C Screening: does qualify; Completed 12/24/2015  Vision Screening: Recommended annual  ophthalmology exams for early detection of glaucoma and other disorders of the eye. Is the patient up to date with their annual eye exam?  Yes  Who is the provider or what is the name of the office in which the patient attends annual eye exams? [facility] If pt is not established with a provider, would they like to be referred to a provider to establish care? No .   Dental Screening: Recommended annual dental exams for proper oral hygiene  Community Resource Referral / Chronic Care Management: CRR required this visit?  No   CCM required this visit?  No      Plan:     I have personally reviewed and noted the following in the patient's chart:   . Medical and social history . Use of alcohol, tobacco or illicit drugs  . Current medications and supplements . Functional ability and status . Nutritional status . Physical activity . Advanced directives . List of other physicians . Hospitalizations, surgeries, and ER visits in previous 12 months . Vitals . Screenings to include cognitive, depression, and falls . Referrals and appointments  In addition, I have reviewed and discussed with patient certain preventive protocols, quality metrics, and best practice recommendations. A written personalized care plan for preventive services as well as general preventive health recommendations were provided to patient.     Barb Merino, LPN   03/17/3663   Nurse Notes:

## 2020-09-22 NOTE — Patient Instructions (Signed)
Daniel Leon , Thank you for taking time to come for your Medicare Wellness Visit. I appreciate your ongoing commitment to your health goals. Please review the following plan we discussed and let me know if I can assist you in the future.   Screening recommendations/referrals: Colonoscopy: completed 12/12/2012, due 12/13/2022 Recommended yearly ophthalmology/optometry visit for glaucoma screening and checkup Recommended yearly dental visit for hygiene and checkup  Vaccinations: Influenza vaccine: completed 05/21/2020, due 03/23/2021 Pneumococcal vaccine: completed 06/30/2016 Tdap vaccine: completed 07/25/2012, due 07/25/2022 Shingles vaccine: completed   Covid-19:  06/17/2020, 10/04/2019, 09/13/2019  Advanced directives: Please bring a copy of your POA (Power of Attorney) and/or Living Will to your next appointment.   Conditions/risks identified: noone  Next appointment: Follow up in one year for your annual wellness visit.   Preventive Care 38 Years and Older, Male Preventive care refers to lifestyle choices and visits with your health care provider that can promote health and wellness. What does preventive care include?  A yearly physical exam. This is also called an annual well check.  Dental exams once or twice a year.  Routine eye exams. Ask your health care provider how often you should have your eyes checked.  Personal lifestyle choices, including:  Daily care of your teeth and gums.  Regular physical activity.  Eating a healthy diet.  Avoiding tobacco and drug use.  Limiting alcohol use.  Practicing safe sex.  Taking low doses of aspirin every day.  Taking vitamin and mineral supplements as recommended by your health care provider. What happens during an annual well check? The services and screenings done by your health care provider during your annual well check will depend on your age, overall health, lifestyle risk factors, and family history of disease. Counseling   Your health care provider may ask you questions about your:  Alcohol use.  Tobacco use.  Drug use.  Emotional well-being.  Home and relationship well-being.  Sexual activity.  Eating habits.  History of falls.  Memory and ability to understand (cognition).  Work and work Astronomer. Screening  You may have the following tests or measurements:  Height, weight, and BMI.  Blood pressure.  Lipid and cholesterol levels. These may be checked every 5 years, or more frequently if you are over 56 years old.  Skin check.  Lung cancer screening. You may have this screening every year starting at age 50 if you have a 30-pack-year history of smoking and currently smoke or have quit within the past 15 years.  Fecal occult blood test (FOBT) of the stool. You may have this test every year starting at age 75.  Flexible sigmoidoscopy or colonoscopy. You may have a sigmoidoscopy every 5 years or a colonoscopy every 10 years starting at age 84.  Prostate cancer screening. Recommendations will vary depending on your family history and other risks.  Hepatitis C blood test.  Hepatitis B blood test.  Sexually transmitted disease (STD) testing.  Diabetes screening. This is done by checking your blood sugar (glucose) after you have not eaten for a while (fasting). You may have this done every 1-3 years.  Abdominal aortic aneurysm (AAA) screening. You may need this if you are a current or former smoker.  Osteoporosis. You may be screened starting at age 59 if you are at high risk. Talk with your health care provider about your test results, treatment options, and if necessary, the need for more tests. Vaccines  Your health care provider may recommend certain vaccines, such as:  Influenza  vaccine. This is recommended every year.  Tetanus, diphtheria, and acellular pertussis (Tdap, Td) vaccine. You may need a Td booster every 10 years.  Zoster vaccine. You may need this after age  23.  Pneumococcal 13-valent conjugate (PCV13) vaccine. One dose is recommended after age 1.  Pneumococcal polysaccharide (PPSV23) vaccine. One dose is recommended after age 74. Talk to your health care provider about which screenings and vaccines you need and how often you need them. This information is not intended to replace advice given to you by your health care provider. Make sure you discuss any questions you have with your health care provider. Document Released: 09/05/2015 Document Revised: 04/28/2016 Document Reviewed: 06/10/2015 Elsevier Interactive Patient Education  2017 [place] Prevention in the Home Falls can cause injuries. They can happen to people of all ages. There are many things you can do to make your home safe and to help prevent falls. What can I do on the outside of my home?  Regularly fix the edges of walkways and driveways and fix any cracks.  Remove anything that might make you trip as you walk through a door, such as a raised step or threshold.  Trim any bushes or trees on the path to your home.  Use bright outdoor lighting.  Clear any walking paths of anything that might make someone trip, such as rocks or tools.  Regularly check to see if handrails are loose or broken. Make sure that both sides of any steps have handrails.  Any raised decks and porches should have guardrails on the edges.  Have any leaves, snow, or ice cleared regularly.  Use sand or salt on walking paths during winter.  Clean up any spills in your garage right away. This includes oil or grease spills. What can I do in the bathroom?  Use night lights.  Install grab bars by the toilet and in the tub and shower. Do not use towel bars as grab bars.  Use non-skid mats or decals in the tub or shower.  If you need to sit down in the shower, use a plastic, non-slip stool.  Keep the floor dry. Clean up any water that spills on the floor as soon as it happens.  Remove  soap buildup in the tub or shower regularly.  Attach bath mats securely with double-sided non-slip rug tape.  Do not have throw rugs and other things on the floor that can make you trip. What can I do in the bedroom?  Use night lights.  Make sure that you have a light by your bed that is easy to reach.  Do not use any sheets or blankets that are too big for your bed. They should not hang down onto the floor.  Have a firm chair that has side arms. You can use this for support while you get dressed.  Do not have throw rugs and other things on the floor that can make you trip. What can I do in the kitchen?  Clean up any spills right away.  Avoid walking on wet floors.  Keep items that you use a lot in easy-to-reach places.  If you need to reach something above you, use a strong step stool that has a grab bar.  Keep electrical cords out of the way.  Do not use floor polish or wax that makes floors slippery. If you must use wax, use non-skid floor wax.  Do not have throw rugs and other things on the floor that can  make you trip. What can I do with my stairs?  Do not leave any items on the stairs.  Make sure that there are handrails on both sides of the stairs and use them. Fix handrails that are broken or loose. Make sure that handrails are as long as the stairways.  Check any carpeting to make sure that it is firmly attached to the stairs. Fix any carpet that is loose or worn.  Avoid having throw rugs at the top or bottom of the stairs. If you do have throw rugs, attach them to the floor with carpet tape.  Make sure that you have a light switch at the top of the stairs and the bottom of the stairs. If you do not have them, ask someone to add them for you. What else can I do to help prevent falls?  Wear shoes that:  Do not have high heels.  Have rubber bottoms.  Are comfortable and fit you well.  Are closed at the toe. Do not wear sandals.  If you use a  stepladder:  Make sure that it is fully opened. Do not climb a closed stepladder.  Make sure that both sides of the stepladder are locked into place.  Ask someone to hold it for you, if possible.  Clearly mark and make sure that you can see:  Any grab bars or handrails.  First and last steps.  Where the edge of each step is.  Use tools that help you move around (mobility aids) if they are needed. These include:  Canes.  Walkers.  Scooters.  Crutches.  Turn on the lights when you go into a dark area. Replace any light bulbs as soon as they burn out.  Set up your furniture so you have a clear path. Avoid moving your furniture around.  If any of your floors are uneven, fix them.  If there are any pets around you, be aware of where they are.  Review your medicines with your doctor. Some medicines can make you feel dizzy. This can increase your chance of falling. Ask your doctor what other things that you can do to help prevent falls. This information is not intended to replace advice given to you by your health care provider. Make sure you discuss any questions you have with your health care provider. Document Released: 06/05/2009 Document Revised: 01/15/2016 Document Reviewed: 09/13/2014 Elsevier Interactive Patient Education  2017 [employer].

## 2020-09-25 ENCOUNTER — Ambulatory Visit: Payer: Medicare PPO

## 2020-10-26 ENCOUNTER — Other Ambulatory Visit: Payer: Self-pay | Admitting: Family Medicine

## 2020-12-30 ENCOUNTER — Other Ambulatory Visit: Payer: Self-pay | Admitting: Family Medicine

## 2020-12-30 NOTE — Telephone Encounter (Signed)
Requested medication (s) are due for refill today: no  Requested medication (s) are on the active medication list: yes  Last refill:  10/19/2020  Future visit scheduled: yes  Notes to clinic: Patient has appt on 01/08/2021 Review for 90 day supply    Requested Prescriptions  Pending Prescriptions Disp Refills   gabapentin (NEURONTIN) 400 MG capsule [Pharmacy Med Name: GABAPENTIN 400MG  CAPSULES] 270 capsule 1    Sig: TAKE ONE CAPSULE BY MOUTH TWICE DAILY. MAK TAKE A THIRD CAPSULE AS NEEDED FOR PAIN      Neurology: Anticonvulsants - gabapentin Passed - 12/30/2020  1:51 PM      Passed - Valid encounter within last 12 months    Recent Outpatient Visits           6 months ago DM type 2 with diabetic peripheral neuropathy (HCC)   [facility] Isabel, Megan P, DO   1 year ago Routine general medical examination at a health care facility   [facility], Megan P, DO   1 year ago DM type 2 with diabetic peripheral neuropathy [facility])   [facility] [place], Megan P, DO   2 years ago Essential hypertension   [facility] Crissman, SAN REMO, MD   2 years ago Essential hypertension   [facility] Crissman, Redge Gainer, MD       Future Appointments             In 1 week Johnson, Redge Gainer, DO [facility]   In 8 months  [facility]

## 2020-12-30 NOTE — Telephone Encounter (Signed)
Pt has apt on 01/08/2021

## 2021-01-08 ENCOUNTER — Encounter: Payer: Self-pay | Admitting: Family Medicine

## 2021-01-08 ENCOUNTER — Ambulatory Visit (INDEPENDENT_AMBULATORY_CARE_PROVIDER_SITE_OTHER): Payer: Medicare PPO | Admitting: Family Medicine

## 2021-01-08 ENCOUNTER — Other Ambulatory Visit: Payer: Self-pay

## 2021-01-08 VITALS — Temp 98.0°F | Ht 76.5 in | Wt 252.0 lb

## 2021-01-08 DIAGNOSIS — Z89512 Acquired absence of left leg below knee: Secondary | ICD-10-CM

## 2021-01-08 DIAGNOSIS — D692 Other nonthrombocytopenic purpura: Secondary | ICD-10-CM | POA: Diagnosis not present

## 2021-01-08 DIAGNOSIS — Z0000 Encounter for general adult medical examination without abnormal findings: Secondary | ICD-10-CM

## 2021-01-08 DIAGNOSIS — N400 Enlarged prostate without lower urinary tract symptoms: Secondary | ICD-10-CM

## 2021-01-08 DIAGNOSIS — E1142 Type 2 diabetes mellitus with diabetic polyneuropathy: Secondary | ICD-10-CM

## 2021-01-08 DIAGNOSIS — D649 Anemia, unspecified: Secondary | ICD-10-CM | POA: Diagnosis not present

## 2021-01-08 DIAGNOSIS — M25561 Pain in right knee: Secondary | ICD-10-CM | POA: Diagnosis not present

## 2021-01-08 DIAGNOSIS — I10 Essential (primary) hypertension: Secondary | ICD-10-CM | POA: Diagnosis not present

## 2021-01-08 DIAGNOSIS — G8929 Other chronic pain: Secondary | ICD-10-CM

## 2021-01-08 LAB — URINALYSIS, ROUTINE W REFLEX MICROSCOPIC
Bilirubin, UA: NEGATIVE
Glucose, UA: NEGATIVE
Ketones, UA: NEGATIVE
Leukocytes,UA: NEGATIVE
Nitrite, UA: NEGATIVE
Protein,UA: NEGATIVE
RBC, UA: NEGATIVE
Specific Gravity, UA: 1.025 (ref 1.005–1.030)
Urobilinogen, Ur: 0.2 mg/dL (ref 0.2–1.0)
pH, UA: 5 (ref 5.0–7.5)

## 2021-01-08 LAB — MICROALBUMIN, URINE WAIVED
Creatinine, Urine Waived: 200 mg/dL (ref 10–300)
Microalb, Ur Waived: 30 mg/L — ABNORMAL HIGH (ref 0–19)
Microalb/Creat Ratio: <30 mg/g (ref <30)

## 2021-01-08 LAB — BAYER DCA HB A1C WAIVED: HB A1C (BAYER DCA - WAIVED): 5.8 % (ref <7.0)

## 2021-01-08 MED ORDER — LISINOPRIL 10 MG PO TABS: 15.0000 mg | ORAL_TABLET | Freq: Every day | ORAL | 1 refills | Status: DC

## 2021-01-08 MED ORDER — GABAPENTIN 400 MG PO CAPS: ORAL_CAPSULE | ORAL | 1 refills | Status: DC

## 2021-01-08 NOTE — Assessment & Plan Note (Signed)
Rechecking labs today. Await results. Treat as needed.  °

## 2021-01-08 NOTE — Assessment & Plan Note (Signed)
Under good control on current regimen. Continue current regimen. Continue to monitor. Call with any concerns. Refills given. Labs drawn today.   

## 2021-01-08 NOTE — Assessment & Plan Note (Addendum)
Under good control on current regimen. Continue current regimen. Continue to monitor. Call with any concerns. Labs drawn today.  

## 2021-01-08 NOTE — Progress Notes (Signed)
Temp 98 F (36.7 C)   Ht 6' 4.5" (1.943 m)   Wt 252 lb (114.3 kg)   SpO2 98%   BMI 30.27 kg/m    Subjective:    Patient ID: Daniel Leon, male    DOB: August 14, 1951, 70 y.o.   MRN: [id number]  HPI: Daniel Leon is a 70 y.o. male presenting on 01/08/2021 for comprehensive medical examination. Current medical complaints include:  DIABETES Hypoglycemic episodes:no Polydipsia/polyuria: no Visual disturbance: no Chest pain: no Paresthesias: no Glucose Monitoring: no  Accucheck frequency: Not Checking Taking Insulin?: no Blood Pressure Monitoring: not checking Retinal Examination: Up to Date Foot Exam: Up to Date Diabetic Education: Completed Pneumovax: Up to Date Influenza: Up to Date Aspirin: yes  HYPERTENSION / HYPERLIPIDEMIA Satisfied with current treatment? yes Duration of hypertension: chronic BP monitoring frequency: not checking BP medication side effects: no Past BP meds: lisinopril Duration of hyperlipidemia: chronic Cholesterol medication side effects: not on anything Cholesterol supplements: none Past cholesterol medications: none Medication compliance: excellent compliance Aspirin: no Recent stressors: no Recurrent headaches: no Visual changes: no Palpitations: no Dyspnea: no Chest pain: no Lower extremity edema: no Dizzy/lightheaded: yes  Interim Problems from his last visit: no  Depression Screen done today and results listed below:  Depression screen [facility] 2/9 01/08/2021 09/22/2020 06/19/2020 12/03/2019 12/03/2019  Decreased Interest 0 0 0 0 0  Down, Depressed, Hopeless 0 0 0 0 0  PHQ - 2 Score 0 0 0 0 0  Altered sleeping - - 0 0 0  Tired, decreased energy - - 0 0 0  Change in appetite - - 0 0 0  Feeling bad or failure about yourself  - - 0 0 0  Trouble concentrating - - 0 0 0  Moving slowly or fidgety/restless - - 0 0 0  Suicidal thoughts - - 0 0 0  PHQ-9 Score - - 0 0 0  Difficult doing work/chores - - Not difficult at all Not  difficult at all -     Past Medical History:  Past Medical History:  Diagnosis Date  . 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 Charcot ankle, left    collapse  . Charcot's joint of foot   . Diabetes mellitus without complication (HCC)    DIET-CONTROLLED  . Early cataracts, bilateral   . Essential hypertension   . Right bundle branch block (RBBB)   . Traumatic arthropathy of ankle and foot   . Wears glasses     Surgical History:  Past Surgical History:  Procedure Laterality Date  . AMPUTATION Left 10/13/2018   Procedure: LEFT BELOW KNEE AMPUTATION;  Surgeon: [name], MD;  Location: [facility] OR;  Service: Orthopedics;  Laterality: Left;  . COLONOSCOPY    . ELBOW SURGERY Right 1966  . EYE SURGERY  03/2019   cataracts- [place]   . EYE SURGERY  02/2019   cataracts - [place]  . FOOT ARTHRODESIS, SUBTALAR Right 2019  . TONSILLECTOMY    . TOTAL ANKLE REPLACEMENT    . ULNAR NERVE TRANSPOSITION Right 09/05/2015   Procedure: ULNAR NERVE DECOMPRESSION/TRANSPOSITION;  Surgeon: [name], MD;  Location: [facility] ORS;  Service: Orthopedics;  Laterality: Right;    Medications:  Current Outpatient Medications on File Prior to Visit  Medication Sig  . Cholecalciferol (VITAMIN D3) 125 MCG (5000 UT) CAPS Take 5,000 Units by mouth daily.  . diclofenac sodium (VOLTAREN) 1 % GEL Apply 1 application topically 4 (four) times daily as needed (pain).  Marland Kitchen  Multiple Vitamin (MULTIVITAMIN) tablet Take 1 tablet by mouth daily.   No current facility-administered medications on file prior to visit.    Allergies:  Allergies  Allergen Reactions  . Oxycodone Itching    Social History:  Social History   Socioeconomic History  . Marital status: Married    Spouse name: Not on file  . Number of children: Not on file  . Years of education: college   . Highest education level: Not on file  Occupational History  . Not on file  Tobacco Use  . Smoking status: Former Smoker     Packs/day: 0.50    Years: 10.00    Pack years: 5.00    Types: Cigarettes    Quit date: 07/27/2000    Years since quitting: 20.4  . Smokeless tobacco: Never Used  Vaping Use  . Vaping Use: Never used  Substance and Sexual Activity  . Alcohol use: Yes    Comment: very little  . Drug use: No  . Sexual activity: Not Currently  Other Topics Concern  . Not on file  Social History Narrative  . Not on file   Social Determinants of Health   Financial Resource Strain: Low Risk   . Difficulty of Paying Living Expenses: Not hard at all  Food Insecurity: No Food Insecurity  . Worried About Programme researcher, broadcasting/film/video in the Last Year: Never true  . Ran Out of Food in the Last Year: Never true  Transportation Needs: No Transportation Needs  . Lack of Transportation (Medical): No  . Lack of Transportation (Non-Medical): No  Physical Activity: Sufficiently Active  . Days of Exercise per Week: 5 days  . Minutes of Exercise per Session: 30 min  Stress: No Stress Concern Present  . Feeling of Stress : Not at all  Social Connections: Not on file  Intimate Partner Violence: Not on file   Social History   Tobacco Use  Smoking Status Former Smoker  . Packs/day: 0.50  . Years: 10.00  . Pack years: 5.00  . Types: Cigarettes  . Quit date: 07/27/2000  . Years since quitting: 20.4  Smokeless Tobacco Never Used   Social History   Substance and Sexual Activity  Alcohol Use Yes   Comment: very little    Family History:  Family History  Problem Relation Age of Onset  . Heart disease Mother   . Hypertension Mother   . Heart disease Father   . Hypertension Father   . Hypertension Brother   . Prostate cancer Neg Hx   . Bladder Cancer Neg Hx   . Kidney cancer Neg Hx     Past medical history, surgical history, medications, allergies, family history and social history reviewed with patient today and changes made to appropriate areas of the chart.   Review of Systems  Constitutional:  Negative.   HENT: Negative.   Eyes: Negative.   Respiratory: Negative.   Cardiovascular: Negative.   Gastrointestinal: Negative.   Genitourinary: Negative.   Musculoskeletal: Positive for joint pain. Negative for back pain, falls, myalgias and neck pain.  Skin: Negative.   Neurological: Positive for dizziness and tingling. Negative for tremors, sensory change, speech change, focal weakness, seizures, loss of consciousness, weakness and headaches.  Endo/Heme/Allergies: Negative for environmental allergies and polydipsia. Bruises/bleeds easily.  Psychiatric/Behavioral: Negative.    All other ROS negative except what is listed above and in the HPI.      Objective:    Temp 98 F (36.7 C)   Ht 6'  4.5" (1.943 m)   Wt 252 lb (114.3 kg)   SpO2 98%   BMI 30.27 kg/m   Wt Readings from Last 3 Encounters:  01/08/21 252 lb (114.3 kg)  09/22/20 250 lb (113.4 kg)  06/19/20 267 lb (121.1 kg)     Physical Exam Vitals and nursing note reviewed.  Constitutional:      General: He is not in acute distress.    Appearance: Normal appearance. He is obese. He is not ill-appearing, toxic-appearing or diaphoretic.  HENT:     Head: Normocephalic and atraumatic.     Right Ear: Tympanic membrane, ear canal and external ear normal. There is no impacted cerumen.     Left Ear: Tympanic membrane, ear canal and external ear normal. There is no impacted cerumen.     Nose: Nose normal. No congestion or rhinorrhea.     Mouth/Throat:     Mouth: Mucous membranes are moist.     Pharynx: Oropharynx is clear. No oropharyngeal exudate or posterior oropharyngeal erythema.  Eyes:     General: No scleral icterus.       Right eye: No discharge.        Left eye: No discharge.     Extraocular Movements: Extraocular movements intact.     Conjunctiva/sclera: Conjunctivae normal.     Pupils: Pupils are equal, round, and reactive to light.  Neck:     Vascular: No carotid bruit.  Cardiovascular:     Rate and Rhythm:  Normal rate and regular rhythm.     Pulses: Normal pulses.     Heart sounds: No murmur heard. No friction rub. No gallop.   Pulmonary:     Effort: Pulmonary effort is normal. No respiratory distress.     Breath sounds: Normal breath sounds. No stridor. No wheezing, rhonchi or rales.  Chest:     Chest wall: No tenderness.  Abdominal:     General: Abdomen is flat. Bowel sounds are normal. There is no distension.     Palpations: Abdomen is soft. There is no mass.     Tenderness: There is no abdominal tenderness. There is no right CVA tenderness, left CVA tenderness, guarding or rebound.     Hernia: No hernia is present.  Genitourinary:    Comments: Genital exam deferred with shared decision making Musculoskeletal:        General: No swelling, tenderness, deformity or signs of injury.     Cervical back: Normal range of motion and neck supple. No rigidity. No muscular tenderness.     Right lower leg: No edema.     Left lower leg: No edema.     Comments: Prosthesis L lower leg  Lymphadenopathy:     Cervical: No cervical adenopathy.  Skin:    General: Skin is warm and dry.     Capillary Refill: Capillary refill takes less than 2 seconds.     Coloration: Skin is not jaundiced or pale.     Findings: No bruising, erythema, lesion or rash.  Neurological:     General: No focal deficit present.     Mental Status: He is alert and oriented to person, place, and time.     Cranial Nerves: No cranial nerve deficit.     Sensory: No sensory deficit.     Motor: No weakness.     Coordination: Coordination normal.     Gait: Gait normal.     Deep Tendon Reflexes: Reflexes normal.  Psychiatric:        Mood and Affect:  Mood normal.        Behavior: Behavior normal.        Thought Content: Thought content normal.        Judgment: Judgment normal.     Results for orders placed or performed in visit on 01/08/21  Bayer DCA Hb A1c Waived  Result Value Ref Range   HB A1C (BAYER DCA - WAIVED) 5.8  <7.0 %  Urinalysis, Routine w reflex microscopic  Result Value Ref Range   Specific Gravity, UA 1.025 1.005 - 1.030   pH, UA 5.0 5.0 - 7.5   Color, UA Yellow Yellow   Appearance Ur Clear Clear   Leukocytes,UA Negative Negative   Protein,UA Negative Negative/Trace   Glucose, UA Negative Negative   Ketones, UA Negative Negative   RBC, UA Negative Negative   Bilirubin, UA Negative Negative   Urobilinogen, Ur 0.2 0.2 - 1.0 mg/dL   Nitrite, UA Negative Negative  Microalbumin, Urine Waived  Result Value Ref Range   Microalb, Ur Waived 30 (H) 0 - 19 mg/L   Creatinine, Urine Waived 200 10 - 300 mg/dL   Microalb/Creat Ratio <30 <30 mg/g      Assessment & Plan:   Problem List Items Addressed This Visit      Cardiovascular and Mediastinum   Essential hypertension    Under good control on current regimen. Continue current regimen. Continue to monitor. Call with any concerns. Refills given. Labs drawn today.        Relevant Medications   lisinopril (ZESTRIL) 10 MG tablet   Other Relevant Orders   Comprehensive metabolic panel   TSH   Urinalysis, Routine w reflex microscopic (Completed)   Senile purpura (HCC)    Reassured patient. Continue to monitor. Call with any concerns.       Relevant Medications   lisinopril (ZESTRIL) 10 MG tablet   Other Relevant Orders   Comprehensive metabolic panel     Endocrine   DM type 2 with diabetic peripheral neuropathy (HCC)    Under good control on current regimen. Continue current regimen. Continue to monitor. Call with any concerns. Refills given. Labs drawn today.        Relevant Medications   gabapentin (NEURONTIN) 400 MG capsule   lisinopril (ZESTRIL) 10 MG tablet   Other Relevant Orders   Bayer DCA Hb A1c Waived (Completed)   Comprehensive metabolic panel   Lipid Panel w/o Chol/HDL Ratio   Urinalysis, Routine w reflex microscopic (Completed)   Microalbumin, Urine Waived (Completed)     Genitourinary   BPH (benign prostatic  hyperplasia)    Under good control on current regimen. Continue current regimen. Continue to monitor. Call with any concerns. Refills given. Labs drawn today.        Relevant Orders   Comprehensive metabolic panel   PSA     Other   Anemia    Rechecking labs today. Await results. Treat as needed.       Relevant Orders   Comprehensive metabolic panel   CBC with Differential/Platelet   History of below-knee amputation of left lower extremity (HCC)    Stable. No concerns. Rx for sheath and socks for prosthesis written today.        Other Visit Diagnoses    Routine general medical examination at a health care facility    -  Primary   Vaccines up to date. Screening labs checked today. Colonoscopy up to date. Continue diet and exercise. Call with any concerns.    Chronic pain  of right knee       Referral to ortho. Call with any concerns. Continue to monitor.    Relevant Orders   Ambulatory referral to Orthopedic Surgery       Discussed aspirin prophylaxis for myocardial infarction prevention and decision was made to continue ASA  LABORATORY TESTING:  Health maintenance labs ordered today as discussed above.   The natural history of prostate cancer and ongoing controversy regarding screening and potential treatment outcomes of prostate cancer has been discussed with the patient. The meaning of a false positive PSA and a false negative PSA has been discussed. He indicates understanding of the limitations of this screening test and wishes to proceed with screening PSA testing.   IMMUNIZATIONS:   - Tdap: Tetanus vaccination status reviewed: last tetanus booster within 10 years. - Influenza: Postponed to flu season - Pneumovax: Up to date - Prevnar: Up to date - COVID: Up to date  SCREENING: - Colonoscopy: Up to date  Discussed with patient purpose of the colonoscopy is to detect colon cancer at curable precancerous or early stages   PATIENT COUNSELING:    Sexuality:  Discussed sexually transmitted diseases, partner selection, use of condoms, avoidance of unintended pregnancy  and contraceptive alternatives.   Advised to avoid cigarette smoking.  I discussed with the patient that most people either abstain from alcohol or drink within safe limits (<=14/week and <=4 drinks/occasion for males, <=7/weeks and <= 3 drinks/occasion for females) and that the risk for alcohol disorders and other health effects rises proportionally with the number of drinks per week and how often a drinker exceeds daily limits.  Discussed cessation/primary prevention of drug use and availability of treatment for abuse.   Diet: Encouraged to adjust caloric intake to maintain  or achieve ideal body weight, to reduce intake of dietary saturated fat and total fat, to limit sodium intake by avoiding high sodium foods and not adding table salt, and to maintain adequate dietary potassium and calcium preferably from fresh fruits, vegetables, and low-fat dairy products.    stressed the importance of regular exercise  Injury prevention: Discussed safety belts, safety helmets, smoke detector, smoking near bedding or upholstery.   Dental health: Discussed importance of regular tooth brushing, flossing, and dental visits.   Follow up plan: NEXT PREVENTATIVE PHYSICAL DUE IN 1 YEAR. No follow-ups on file.

## 2021-01-08 NOTE — Assessment & Plan Note (Signed)
Reassured patient. Continue to monitor. Call with any concerns.  

## 2021-01-08 NOTE — Assessment & Plan Note (Signed)
Stable. No concerns. Rx for sheath and socks for prosthesis written today.

## 2021-01-09 ENCOUNTER — Encounter: Payer: Self-pay | Admitting: Family Medicine

## 2021-01-09 LAB — CBC WITH DIFFERENTIAL/PLATELET
Basophils Absolute: 0.1 10*3/uL (ref 0.0–0.2)
Basos: 1 %
EOS (ABSOLUTE): 0.2 10*3/uL (ref 0.0–0.4)
Eos: 2 %
Hematocrit: 38.7 % (ref 37.5–51.0)
Hemoglobin: 13 g/dL (ref 13.0–17.7)
Immature Grans (Abs): 0 10*3/uL (ref 0.0–0.1)
Immature Granulocytes: 1 %
Lymphocytes Absolute: 1.5 10*3/uL (ref 0.7–3.1)
Lymphs: 23 %
MCH: 29.5 pg (ref 26.6–33.0)
MCHC: 33.6 g/dL (ref 31.5–35.7)
MCV: 88 fL (ref 79–97)
Monocytes Absolute: 0.7 10*3/uL (ref 0.1–0.9)
Monocytes: 10 %
Neutrophils Absolute: 4.2 10*3/uL (ref 1.4–7.0)
Neutrophils: 63 %
Platelets: 282 10*3/uL (ref 150–450)
RBC: 4.4 x10E6/uL (ref 4.14–5.80)
RDW: 13.3 % (ref 11.6–15.4)
WBC: 6.7 10*3/uL (ref 3.4–10.8)

## 2021-01-09 LAB — COMPREHENSIVE METABOLIC PANEL
ALT: 12 IU/L (ref 0–44)
AST: 13 IU/L (ref 0–40)
Albumin/Globulin Ratio: 1.4 (ref 1.2–2.2)
Albumin: 4.4 g/dL (ref 3.7–4.7)
Alkaline Phosphatase: 68 IU/L (ref 44–121)
BUN/Creatinine Ratio: 26 — ABNORMAL HIGH (ref 10–24)
BUN: 30 mg/dL — ABNORMAL HIGH (ref 8–27)
Bilirubin Total: 0.3 mg/dL (ref 0.0–1.2)
CO2: 18 mmol/L — ABNORMAL LOW (ref 20–29)
Calcium: 9.7 mg/dL (ref 8.6–10.2)
Chloride: 105 mmol/L (ref 96–106)
Creatinine, Ser: 1.15 mg/dL (ref 0.76–1.27)
Globulin, Total: 3.2 g/dL (ref 1.5–4.5)
Glucose: 142 mg/dL — ABNORMAL HIGH (ref 65–99)
Potassium: 4.9 mmol/L (ref 3.5–5.2)
Sodium: 141 mmol/L (ref 134–144)
Total Protein: 7.6 g/dL (ref 6.0–8.5)
eGFR: 68 mL/min/{1.73_m2} (ref >59)

## 2021-01-09 LAB — LIPID PANEL W/O CHOL/HDL RATIO
Cholesterol, Total: 189 mg/dL (ref 100–199)
HDL: 35 mg/dL — ABNORMAL LOW (ref >39)
LDL Chol Calc (NIH): 97 mg/dL (ref 0–99)
Triglycerides: 338 mg/dL — ABNORMAL HIGH (ref 0–149)
VLDL Cholesterol Cal: 57 mg/dL — ABNORMAL HIGH (ref 5–40)

## 2021-01-09 LAB — PSA: Prostate Specific Ag, Serum: 0.8 ng/mL (ref 0.0–4.0)

## 2021-01-09 LAB — TSH: TSH: 1.21 u[IU]/mL (ref 0.450–4.500)

## 2021-01-12 ENCOUNTER — Telehealth: Payer: Self-pay

## 2021-01-12 NOTE — Telephone Encounter (Signed)
Copied from CRM [id number]. Topic: Quick Communication - Lab Results (Clinic Use ONLY) >> Jan 12, 2021 12:42 PM Leafy Ro wrote: Pt is calling and would like blood work results from 01-08-2021

## 2021-01-12 NOTE — Telephone Encounter (Signed)
Returned patient phone call, no answer left detailed  message advising patient his results have been mailed out to him. Advised patient per provider labs are normal.

## 2021-01-23 DIAGNOSIS — M1711 Unilateral primary osteoarthritis, right knee: Secondary | ICD-10-CM | POA: Diagnosis not present

## 2021-01-23 DIAGNOSIS — R52 Pain, unspecified: Secondary | ICD-10-CM | POA: Diagnosis not present

## 2021-03-25 DIAGNOSIS — Z89512 Acquired absence of left leg below knee: Secondary | ICD-10-CM | POA: Diagnosis not present

## 2021-06-01 DIAGNOSIS — I10 Essential (primary) hypertension: Secondary | ICD-10-CM | POA: Diagnosis not present

## 2021-06-01 DIAGNOSIS — E11610 Type 2 diabetes mellitus with diabetic neuropathic arthropathy: Secondary | ICD-10-CM | POA: Diagnosis not present

## 2021-06-01 DIAGNOSIS — S91104A Unspecified open wound of right lesser toe(s) without damage to nail, initial encounter: Secondary | ICD-10-CM | POA: Diagnosis not present

## 2021-06-01 DIAGNOSIS — Z87891 Personal history of nicotine dependence: Secondary | ICD-10-CM | POA: Diagnosis not present

## 2021-06-01 DIAGNOSIS — M19071 Primary osteoarthritis, right ankle and foot: Secondary | ICD-10-CM | POA: Diagnosis not present

## 2021-06-03 ENCOUNTER — Telehealth: Payer: Self-pay | Admitting: Family Medicine

## 2021-06-03 NOTE — Telephone Encounter (Signed)
Pt called back and stated the  wound caring center is fine with him or any place Dr. Laural Benes refers / please advise

## 2021-06-03 NOTE — Telephone Encounter (Signed)
Copied from CRM [id number]. Topic: Referral - Request for Referral >> Jun 03, 2021  1:57 PM Wyonia Hough E wrote: Has patient seen PCP for this complaint? No  *If NO, is insurance requiring patient see PCP for this issue before PCP can refer them? Referral for which specialty: Wound care  Preferred provider/office: does not want to go to [facility] wound care  Reason for referral: Pt has a wound on his 2nd toe on his right foot / he has had it for 4 days and went to ER in [facility] today / please advise asap

## 2021-06-03 NOTE — Telephone Encounter (Signed)
Spoke with patient and advised that he be seen for issue.  Appointment scheduled.

## 2021-06-06 ENCOUNTER — Other Ambulatory Visit: Payer: Self-pay

## 2021-06-06 DIAGNOSIS — L97519 Non-pressure chronic ulcer of other part of right foot with unspecified severity: Secondary | ICD-10-CM | POA: Diagnosis not present

## 2021-06-06 DIAGNOSIS — Z96669 Presence of unspecified artificial ankle joint: Secondary | ICD-10-CM | POA: Diagnosis not present

## 2021-06-06 DIAGNOSIS — I10 Essential (primary) hypertension: Secondary | ICD-10-CM | POA: Diagnosis not present

## 2021-06-06 DIAGNOSIS — Z87891 Personal history of nicotine dependence: Secondary | ICD-10-CM | POA: Insufficient documentation

## 2021-06-06 DIAGNOSIS — Z79899 Other long term (current) drug therapy: Secondary | ICD-10-CM | POA: Insufficient documentation

## 2021-06-06 DIAGNOSIS — E119 Type 2 diabetes mellitus without complications: Secondary | ICD-10-CM | POA: Diagnosis not present

## 2021-06-06 DIAGNOSIS — N179 Acute kidney failure, unspecified: Secondary | ICD-10-CM | POA: Diagnosis not present

## 2021-06-06 DIAGNOSIS — Z20822 Contact with and (suspected) exposure to covid-19: Secondary | ICD-10-CM | POA: Insufficient documentation

## 2021-06-06 DIAGNOSIS — M868X4 Other osteomyelitis, hand: Secondary | ICD-10-CM | POA: Diagnosis not present

## 2021-06-06 DIAGNOSIS — L03031 Cellulitis of right toe: Secondary | ICD-10-CM | POA: Diagnosis not present

## 2021-06-06 DIAGNOSIS — E11621 Type 2 diabetes mellitus with foot ulcer: Secondary | ICD-10-CM | POA: Diagnosis not present

## 2021-06-06 LAB — BASIC METABOLIC PANEL
Anion gap: 10 (ref 5–15)
BUN: 38 mg/dL — ABNORMAL HIGH (ref 8–23)
CO2: 20 mmol/L — ABNORMAL LOW (ref 22–32)
Calcium: 8.7 mg/dL — ABNORMAL LOW (ref 8.9–10.3)
Chloride: 103 mmol/L (ref 98–111)
Creatinine, Ser: 2.28 mg/dL — ABNORMAL HIGH (ref 0.61–1.24)
GFR, Estimated: 30 mL/min — ABNORMAL LOW (ref >60)
Glucose, Bld: 196 mg/dL — ABNORMAL HIGH (ref 70–99)
Potassium: 4.8 mmol/L (ref 3.5–5.1)
Sodium: 133 mmol/L — ABNORMAL LOW (ref 135–145)

## 2021-06-06 LAB — CBC
HCT: 33.1 % — ABNORMAL LOW (ref 39.0–52.0)
Hemoglobin: 11.4 g/dL — ABNORMAL LOW (ref 13.0–17.0)
MCH: 30.6 pg (ref 26.0–34.0)
MCHC: 34.4 g/dL (ref 30.0–36.0)
MCV: 88.7 fL (ref 80.0–100.0)
Platelets: 203 10*3/uL (ref 150–400)
RBC: 3.73 MIL/uL — ABNORMAL LOW (ref 4.22–5.81)
RDW: 13.7 % (ref 11.5–15.5)
WBC: 6.7 10*3/uL (ref 4.0–10.5)
nRBC: 0 % (ref 0.0–0.2)

## 2021-06-06 NOTE — ED Triage Notes (Signed)
Patient reports weakness and blood in urine.  States seen at ER in [facility] on 10/10 for possible infection of toe and started on antibiotics and then with above symptoms.

## 2021-06-07 ENCOUNTER — Encounter
Admission: EM | Disposition: A | Discharge status: Home / Self Care | Payer: Self-pay | Source: Home / Self Care | Attending: Emergency Medicine

## 2021-06-07 ENCOUNTER — Inpatient Hospital Stay: Payer: Medicare PPO | Admitting: Anesthesiology

## 2021-06-07 ENCOUNTER — Inpatient Hospital Stay: Payer: Medicare PPO

## 2021-06-07 ENCOUNTER — Emergency Department: Payer: Medicare PPO

## 2021-06-07 ENCOUNTER — Encounter: Payer: Self-pay | Admitting: Radiology

## 2021-06-07 ENCOUNTER — Observation Stay
Admission: EM | Admit: 2021-06-07 | Discharge: 2021-06-08 | Disposition: A | Discharge status: Home / Self Care | Payer: Medicare PPO | Attending: Internal Medicine | Admitting: Internal Medicine

## 2021-06-07 DIAGNOSIS — L03031 Cellulitis of right toe: Secondary | ICD-10-CM | POA: Diagnosis not present

## 2021-06-07 DIAGNOSIS — I10 Essential (primary) hypertension: Secondary | ICD-10-CM | POA: Diagnosis present

## 2021-06-07 DIAGNOSIS — Z9889 Other specified postprocedural states: Secondary | ICD-10-CM | POA: Diagnosis not present

## 2021-06-07 DIAGNOSIS — M7989 Other specified soft tissue disorders: Secondary | ICD-10-CM | POA: Diagnosis not present

## 2021-06-07 DIAGNOSIS — E1140 Type 2 diabetes mellitus with diabetic neuropathy, unspecified: Secondary | ICD-10-CM | POA: Diagnosis not present

## 2021-06-07 DIAGNOSIS — Z794 Long term (current) use of insulin: Secondary | ICD-10-CM | POA: Diagnosis not present

## 2021-06-07 DIAGNOSIS — M25474 Effusion, right foot: Secondary | ICD-10-CM | POA: Diagnosis not present

## 2021-06-07 DIAGNOSIS — R531 Weakness: Secondary | ICD-10-CM

## 2021-06-07 DIAGNOSIS — M86171 Other acute osteomyelitis, right ankle and foot: Secondary | ICD-10-CM | POA: Diagnosis not present

## 2021-06-07 DIAGNOSIS — M19071 Primary osteoarthritis, right ankle and foot: Secondary | ICD-10-CM | POA: Diagnosis not present

## 2021-06-07 DIAGNOSIS — E1169 Type 2 diabetes mellitus with other specified complication: Secondary | ICD-10-CM | POA: Diagnosis not present

## 2021-06-07 DIAGNOSIS — R319 Hematuria, unspecified: Secondary | ICD-10-CM

## 2021-06-07 DIAGNOSIS — N179 Acute kidney failure, unspecified: Secondary | ICD-10-CM

## 2021-06-07 DIAGNOSIS — Z89512 Acquired absence of left leg below knee: Secondary | ICD-10-CM | POA: Diagnosis present

## 2021-06-07 DIAGNOSIS — L97519 Non-pressure chronic ulcer of other part of right foot with unspecified severity: Secondary | ICD-10-CM

## 2021-06-07 DIAGNOSIS — E1142 Type 2 diabetes mellitus with diabetic polyneuropathy: Secondary | ICD-10-CM

## 2021-06-07 DIAGNOSIS — Z89421 Acquired absence of other right toe(s): Secondary | ICD-10-CM | POA: Diagnosis not present

## 2021-06-07 DIAGNOSIS — R310 Gross hematuria: Secondary | ICD-10-CM

## 2021-06-07 DIAGNOSIS — D649 Anemia, unspecified: Secondary | ICD-10-CM | POA: Diagnosis not present

## 2021-06-07 DIAGNOSIS — R600 Localized edema: Secondary | ICD-10-CM | POA: Diagnosis not present

## 2021-06-07 DIAGNOSIS — M869 Osteomyelitis, unspecified: Secondary | ICD-10-CM | POA: Diagnosis not present

## 2021-06-07 DIAGNOSIS — E11621 Type 2 diabetes mellitus with foot ulcer: Principal | ICD-10-CM

## 2021-06-07 HISTORY — PX: AMPUTATION TOE: SHX6595

## 2021-06-07 LAB — GLUCOSE, CAPILLARY: Glucose-Capillary: 124 mg/dL — ABNORMAL HIGH (ref 70–99)

## 2021-06-07 LAB — CBG MONITORING, ED
Glucose-Capillary: 122 mg/dL — ABNORMAL HIGH (ref 70–99)
Glucose-Capillary: 138 mg/dL — ABNORMAL HIGH (ref 70–99)

## 2021-06-07 LAB — RESP PANEL BY RT-PCR (FLU A&B, COVID) ARPGX2
Influenza A by PCR: NEGATIVE
Influenza B by PCR: NEGATIVE
SARS Coronavirus 2 by RT PCR: NEGATIVE

## 2021-06-07 LAB — URINALYSIS, COMPLETE (UACMP) WITH MICROSCOPIC
Bacteria, UA: NONE SEEN
Bilirubin Urine: NEGATIVE
Glucose, UA: NEGATIVE mg/dL
Ketones, ur: NEGATIVE mg/dL
Nitrite: NEGATIVE
Protein, ur: 30 mg/dL — AB
Specific Gravity, Urine: 1.018 (ref 1.005–1.030)
pH: 5 (ref 5.0–8.0)

## 2021-06-07 LAB — LACTIC ACID, PLASMA: Lactic Acid, Venous: 0.7 mmol/L (ref 0.5–1.9)

## 2021-06-07 LAB — PROCALCITONIN: Procalcitonin: 0.28 ng/mL

## 2021-06-07 SURGERY — AMPUTATION, TOE
Anesthesia: General | Site: Toe | Laterality: Right

## 2021-06-07 MED ORDER — FENTANYL CITRATE (PF) 100 MCG/2ML IJ SOLN
INTRAMUSCULAR | Status: AC
  Filled 2021-06-07: qty 2

## 2021-06-07 MED ORDER — SODIUM CHLORIDE 0.9 % IV BOLUS (SEPSIS)
1000.0000 mL | Freq: Once | INTRAVENOUS | Status: AC
  Administered 2021-06-07: 1000 mL via INTRAVENOUS

## 2021-06-07 MED ORDER — ENOXAPARIN SODIUM 40 MG/0.4ML IJ SOSY
40.0000 mg | PREFILLED_SYRINGE | INTRAMUSCULAR | Status: DC
  Administered 2021-06-07: 40 mg via SUBCUTANEOUS
  Filled 2021-06-07: qty 0.4

## 2021-06-07 MED ORDER — SODIUM CHLORIDE 0.9 % IV SOLN
1.0000 g | INTRAVENOUS | Status: DC
  Administered 2021-06-08: 1 g via INTRAVENOUS
  Filled 2021-06-07: qty 1
  Filled 2021-06-07: qty 10

## 2021-06-07 MED ORDER — BUPIVACAINE HCL 0.25 % IJ SOLN
INTRAMUSCULAR | Status: DC | PRN
  Administered 2021-06-07: 5 mL

## 2021-06-07 MED ORDER — BUPIVACAINE HCL 0.5 % IJ SOLN
INTRAMUSCULAR | Status: DC | PRN
  Administered 2021-06-07: 5 mL

## 2021-06-07 MED ORDER — SODIUM CHLORIDE 0.9 % IV SOLN
2.0000 g | Freq: Once | INTRAVENOUS | Status: AC
  Administered 2021-06-07: 2 g via INTRAVENOUS
  Filled 2021-06-07: qty 20

## 2021-06-07 MED ORDER — FENTANYL CITRATE (PF) 100 MCG/2ML IJ SOLN: 25.0000 ug | INTRAMUSCULAR | Status: DC | PRN

## 2021-06-07 MED ORDER — ONDANSETRON HCL 4 MG/2ML IJ SOLN: 4.0000 mg | Freq: Four times a day (QID) | INTRAMUSCULAR | Status: DC | PRN

## 2021-06-07 MED ORDER — FENTANYL CITRATE (PF) 100 MCG/2ML IJ SOLN
INTRAMUSCULAR | Status: DC | PRN
  Administered 2021-06-07 (×2): 25 ug via INTRAVENOUS
  Administered 2021-06-07: 50 ug via INTRAVENOUS

## 2021-06-07 MED ORDER — VANCOMYCIN HCL 1250 MG/250ML IV SOLN
1250.0000 mg | INTRAVENOUS | Status: DC
  Administered 2021-06-08: 1250 mg via INTRAVENOUS
  Filled 2021-06-07: qty 250

## 2021-06-07 MED ORDER — VANCOMYCIN HCL 1250 MG/250ML IV SOLN
1250.0000 mg | Freq: Once | INTRAVENOUS | Status: AC
  Administered 2021-06-07: 1250 mg via INTRAVENOUS
  Filled 2021-06-07: qty 250

## 2021-06-07 MED ORDER — 0.9 % SODIUM CHLORIDE (POUR BTL) OPTIME
TOPICAL | Status: DC | PRN
  Administered 2021-06-07: 500 mL

## 2021-06-07 MED ORDER — PROPOFOL 500 MG/50ML IV EMUL
INTRAVENOUS | Status: DC | PRN
  Administered 2021-06-07: 130 ug/kg/min via INTRAVENOUS

## 2021-06-07 MED ORDER — HYDROCODONE-ACETAMINOPHEN 5-325 MG PO TABS
1.0000 | ORAL_TABLET | ORAL | Status: DC | PRN
  Administered 2021-06-07: 2 via ORAL
  Filled 2021-06-07: qty 2

## 2021-06-07 MED ORDER — ACETAMINOPHEN 650 MG RE SUPP
650.0000 mg | Freq: Four times a day (QID) | RECTAL | Status: DC | PRN
  Filled 2021-06-07: qty 1

## 2021-06-07 MED ORDER — DEXMEDETOMIDINE (PRECEDEX) IN NS 20 MCG/5ML (4 MCG/ML) IV SYRINGE
PREFILLED_SYRINGE | INTRAVENOUS | Status: DC | PRN
  Administered 2021-06-07: 12 ug via INTRAVENOUS

## 2021-06-07 MED ORDER — INSULIN ASPART 100 UNIT/ML IJ SOLN
0.0000 [IU] | Freq: Two times a day (BID) | INTRAMUSCULAR | Status: DC
  Administered 2021-06-07: 2 [IU] via SUBCUTANEOUS
  Filled 2021-06-07: qty 1

## 2021-06-07 MED ORDER — VANCOMYCIN HCL IN DEXTROSE 1-5 GM/200ML-% IV SOLN
1000.0000 mg | Freq: Once | INTRAVENOUS | Status: AC
  Administered 2021-06-07: 1000 mg via INTRAVENOUS
  Filled 2021-06-07: qty 200

## 2021-06-07 MED ORDER — SODIUM CHLORIDE 0.9 % IV SOLN: INTRAVENOUS | Status: AC

## 2021-06-07 MED ORDER — INFLUENZA VAC A&B SA ADJ QUAD 0.5 ML IM PRSY: 0.5000 mL | PREFILLED_SYRINGE | INTRAMUSCULAR | Status: DC

## 2021-06-07 MED ORDER — PROPOFOL 500 MG/50ML IV EMUL
INTRAVENOUS | Status: AC
  Filled 2021-06-07: qty 100

## 2021-06-07 MED ORDER — ONDANSETRON HCL 4 MG PO TABS: 4.0000 mg | ORAL_TABLET | Freq: Four times a day (QID) | ORAL | Status: DC | PRN

## 2021-06-07 MED ORDER — ACETAMINOPHEN 325 MG PO TABS: 650.0000 mg | ORAL_TABLET | Freq: Four times a day (QID) | ORAL | Status: DC | PRN

## 2021-06-07 SURGICAL SUPPLY — 36 items
BLADE MED AGGRESSIVE (BLADE) ×2 IMPLANT
BLADE SURG 15 STRL LF DISP TIS (BLADE) ×2 IMPLANT
BLADE SURG 15 STRL SS (BLADE) ×4
BLADE SURG MINI STRL (BLADE) ×2 IMPLANT
BNDG CONFORM 2 STRL LF (GAUZE/BANDAGES/DRESSINGS) ×2 IMPLANT
BNDG ESMARK 4X12 TAN STRL LF (GAUZE/BANDAGES/DRESSINGS) ×2 IMPLANT
BNDG GAUZE ELAST 4 BULKY (GAUZE/BANDAGES/DRESSINGS) ×2 IMPLANT
CUFF TOURN SGL QUICK 18X4 (TOURNIQUET CUFF) ×2 IMPLANT
DRAPE FLUOR MINI C-ARM 54X84 (DRAPES) ×2 IMPLANT
DURAPREP 26ML APPLICATOR (WOUND CARE) ×2 IMPLANT
GAUZE 4X4 16PLY ~~LOC~~+RFID DBL (SPONGE) ×2 IMPLANT
GAUZE SPONGE 4X4 12PLY STRL (GAUZE/BANDAGES/DRESSINGS) ×2 IMPLANT
GAUZE XEROFORM 1X8 LF (GAUZE/BANDAGES/DRESSINGS) ×2 IMPLANT
GLOVE SURG ENC MOIS LTX SZ7.5 (GLOVE) ×2 IMPLANT
GLOVE SURG UNDER LTX SZ8 (GLOVE) ×2 IMPLANT
GOWN STRL REUS W/ TWL LRG LVL3 (GOWN DISPOSABLE) ×2 IMPLANT
GOWN STRL REUS W/TWL LRG LVL3 (GOWN DISPOSABLE) ×4
KIT TURNOVER KIT A (KITS) ×2 IMPLANT
LABEL OR SOLS (LABEL) ×2 IMPLANT
MANIFOLD NEPTUNE II (INSTRUMENTS) ×2 IMPLANT
NEEDLE FILTER BLUNT 18X 1/2SAF (NEEDLE) ×1
NEEDLE FILTER BLUNT 18X1 1/2 (NEEDLE) ×1 IMPLANT
NEEDLE HYPO 25X1 1.5 SAFETY (NEEDLE) ×4 IMPLANT
NS IRRIG 500ML POUR BTL (IV SOLUTION) ×2 IMPLANT
PACK EXTREMITY ARMC (MISCELLANEOUS) ×2 IMPLANT
SOL PREP PVP 2OZ (MISCELLANEOUS) ×2
SOLUTION PREP PVP 2OZ (MISCELLANEOUS) ×1 IMPLANT
STOCKINETTE BIAS CUT 4 980044 (GAUZE/BANDAGES/DRESSINGS) ×2 IMPLANT
STOCKINETTE STRL 6IN 960660 (GAUZE/BANDAGES/DRESSINGS) ×2 IMPLANT
STRIP CLOSURE SKIN 1/4X4 (GAUZE/BANDAGES/DRESSINGS) ×2 IMPLANT
SUT ETHILON 3-0 FS-10 30 BLK (SUTURE) ×2
SUT PROLENE 3 0 PS 2 (SUTURE) ×2 IMPLANT
SUTURE EHLN 3-0 FS-10 30 BLK (SUTURE) ×1 IMPLANT
SWAB CULTURE AMIES ANAERIB BLU (MISCELLANEOUS) ×2 IMPLANT
SYR 10ML LL (SYRINGE) ×2 IMPLANT
WATER STERILE IRR 500ML POUR (IV SOLUTION) ×2 IMPLANT

## 2021-06-07 NOTE — Op Note (Signed)
Surgeon: Surgeon(s): Candelaria Stagers, DPM  Assistants: None Pre-operative diagnosis: osteomylitis  Post-operative diagnosis: same Procedure: Procedure(s) (LRB): AMPUTATION TOE (Right)  Pathology:  ID Type Source Tests Collected by Time Destination  1 : Right 2nd toe Tissue PATH Digit amputation SURGICAL PATHOLOGY Candelaria Stagers, DPM 06/07/2021 1005     Pertinent Intra-op findings: Osteomyelitic changes noted to the right distal phalanx.  Hard indurated bone noted to the proximal phalanx Anesthesia: Choice  Hemostasis: * No tourniquets in log * EBL: Minimal Materials: 3-0 Prolene Injectables: 10 cc of half percent Marcaine plain 1% lidocaine plain Complications: None  Indications for surgery: A 70 y.o. male presents with right second digit distal phalanx ulceration with underlying osteomyelitis. Patient has failed all conservative therapy including but not limited to local wound care and IV antibiotics. He wishes to have surgical correction of the foot/deformity. It was determined that patient would benefit from right second digit partial amputation with closure. Informed surgical risk consent was reviewed and read aloud to the patient.  I reviewed the films.  I have discussed my findings with the patient in great detail.  I have discussed all risks including but not limited to infection, stiffness, scarring, limp, disability, deformity, damage to blood vessels and nerves, numbness, poor healing, need for braces, arthritis, chronic pain, amputation, death.  All benefits and realistic expectations discussed in great detail.  I have made no promises as to the outcome.  I have provided realistic expectations.  I have offered the patient a 2nd opinion, which they have declined and assured me they preferred to proceed despite the risks   Procedure in detail: The patient was both verbally and visually identified by myself, the nursing staff, and anesthesia staff in the preoperative holding area.  They were then transferred to the operating room and placed on the operative table in supine position.  Attention was directed to the right second digit a fishmouth style incision was delineated using skin marker.  Using #15 blade incision was carried down to the level of the bone.  At this time the digit was disarticulated level of the PIPJ joint.  Given that the middle phalanx may have been involved I believe patient will benefit from a resection of the head of the proximal phalanx.  Using sagittal saw the head of the proximal phalanx was resected in standard technique.  At this time the wound appeared clear of infection no purulent drainage noted.  The wound was thoroughly irrigated with normal saline solution.  The wound was primarily closed with 3-0 Prolene in simple interrupted suture technique.  The wound was dressed with Betadine wet-to-dry dressing, Kerlix, Ace bandage.  All bony prominences were adequately padded.  Disposition Patient is okay to be discharged from our standpoint.  Patient will need 14 days of doxycycline.  No dressing change until follow-up.  Follow-up in clinic 1 week from discharge.  At the conclusion of the procedure the patient was awoken from anesthesia and found to have tolerated the procedure well any complications. There were transferred to PACU with vital signs stable and vascular status intact.  Nicholes Rough, DPM

## 2021-06-07 NOTE — Transfer of Care (Signed)
Immediate Anesthesia Transfer of Care Note  Patient: Daniel Leon  Procedure(s) Performed: AMPUTATION TOE (Right: Toe)  Patient Location: PACU  Anesthesia Type:General  Level of Consciousness: drowsy  Airway & Oxygen Therapy: Patient Spontanous Breathing and Patient connected to face mask oxygen  Post-op Assessment: Report given to RN  Post vital signs: stable  Last Vitals:  Vitals Value Taken Time  BP    Temp    Pulse 90 06/07/21 1017  Resp 22 06/07/21 1017  SpO2 97 % 06/07/21 1017  Vitals shown include unvalidated device data.  Last Pain:  Vitals:   06/07/21 0859  TempSrc: Oral  PainSc: 0-No pain         Complications: No notable events documented.

## 2021-06-07 NOTE — ED Notes (Signed)
Surgeon at bedside. Pt will have surgery late morning. Pt will remain NPO.

## 2021-06-07 NOTE — ED Notes (Signed)
Patient currently in MRI.

## 2021-06-07 NOTE — ED Provider Notes (Signed)
[facility] Emergency Department Provider Note  ____________________________________________   Event Date/Time   First MD Initiated Contact with Patient 06/07/21 0231     (approximate)  I have reviewed the triage vital signs and the nursing notes.   HISTORY  Chief Complaint Weakness    HPI MCKINNLEY SMITHEY is a 70 y.o. male with history of diabetes, hypertension, right bundle branch block, previous left lower extremity BKA who presents to the emergency department with redness, swelling, warmth to the right second toe.  States that he saw a physician in the ER at [facility] on Monday, October 10 and was started on cephalexin and Bactrim.  States at that time x-ray was obtained that showed no acute abnormalities.  States he feels like his foot is not significantly improving and tonight he had chills and an oral temperature of 100.3.  States that he has been soaking this foot and that his toenail fell off.  No injury that he can recall.  He has previously had surgery to this toe.  States he feels very weak all over.  No chest pain, shortness of breath, vomiting, diarrhea.  States he did see blood in his urine tonight.  No abdominal pain, flank pain.  States he did have some dysuria.      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    Charcot ankle, left    collapse   Charcot's joint of foot    Diabetes mellitus without complication (HCC)    DIET-CONTROLLED   Early cataracts, bilateral    Essential hypertension    Right bundle branch block (RBBB)    Traumatic arthropathy of ankle and foot    Wears glasses     Patient Active Problem List   Diagnosis Date Noted   Cellulitis of second toe of right foot 06/07/2021   AKI (acute kidney injury) (HCC) 06/07/2021   Hematuria 06/07/2021   Senile purpura (HCC) 12/03/2019   History of below-knee amputation of left lower extremity  (HCC) 10/13/2018   Charcot foot due to diabetes mellitus (HCC) 01/26/2018   Neuropathy 12/29/2016   BPH (benign prostatic hyperplasia) 12/29/2016   Advanced care planning/counseling discussion 12/29/2016   DM type 2 with diabetic peripheral neuropathy (HCC) 06/30/2016   Anemia 07/28/2015   Essential hypertension     Past Surgical History:  Procedure Laterality Date   AMPUTATION Left 10/13/2018   Procedure: LEFT BELOW KNEE AMPUTATION;  Surgeon: [name], MD;  Location: [facility] OR;  Service: Orthopedics;  Laterality: Left;   COLONOSCOPY     ELBOW SURGERY Right 1966   EYE SURGERY  03/2019   cataracts- [place]    EYE SURGERY  02/2019   cataracts - [place]   FOOT ARTHRODESIS, SUBTALAR Right 2019   TONSILLECTOMY     TOTAL ANKLE REPLACEMENT     ULNAR NERVE TRANSPOSITION Right 09/05/2015   Procedure: ULNAR NERVE DECOMPRESSION/TRANSPOSITION;  Surgeon: [name], MD;  Location: [facility] ORS;  Service: Orthopedics;  Laterality: Right;    Prior to Admission medications   Medication Sig Start Date End Date Taking? Authorizing Provider  Cholecalciferol (VITAMIN D3) 125 MCG (5000 UT) CAPS Take 5,000 Units by mouth daily.   Yes [provider]  gabapentin (NEURONTIN) 400 MG capsule TAKE 1 CAPSULE BY MOUTH TWICE DAILY. MAY TAKE A THIRD CAPSULE AS NEEDED FOR PAIN Patient taking differently: Take 400-800 mg by mouth 2 (two) times daily. TAKE 1  CAPSULE BY MOUTH in the morning and 2 capsules at night for pain. 01/08/21  Yes Johnson, Megan P, DO  lisinopril (ZESTRIL) 10 MG tablet Take 1.5 tablets (15 mg total) by mouth daily. 01/08/21  Yes Johnson, Megan P, DO  meloxicam (MOBIC) 15 MG tablet Take 15 mg by mouth daily. 05/12/21  Yes [provider]  Multiple Vitamin (MULTIVITAMIN) tablet Take 1 tablet by mouth daily.   Yes [provider]  cephALEXin (KEFLEX) 500 MG capsule Take 500 mg by mouth every 12 (twelve) hours. Patient not taking: Reported on 06/07/2021 06/01/21    [provider]  diclofenac sodium (VOLTAREN) 1 % GEL Apply 1 application topically 4 (four) times daily as needed (pain).    [provider]  sulfamethoxazole-trimethoprim (BACTRIM) 400-80 MG tablet Take 2 tablets by mouth daily. Patient not taking: Reported on 06/07/2021 06/01/21   [provider]    Allergies Oxycodone  Family History  Problem Relation Age of Onset   Heart disease Mother    Hypertension Mother    Heart disease Father    Hypertension Father    Hypertension Brother    Prostate cancer Neg Hx    Bladder Cancer Neg Hx    Kidney cancer Neg Hx     Social History Social History   Tobacco Use   Smoking status: Former    Packs/day: 0.50    Years: 10.00    Pack years: 5.00    Types: Cigarettes    Quit date: 07/27/2000    Years since quitting: 20.8   Smokeless tobacco: Never  Vaping Use   Vaping Use: Never used  Substance Use Topics   Alcohol use: Yes    Comment: very little   Drug use: No    Review of Systems Constitutional: + fever. Eyes: No visual changes. ENT: No sore throat. Cardiovascular: Denies chest pain. Respiratory: Denies shortness of breath. Gastrointestinal: No nausea, vomiting, diarrhea. Genitourinary: + for dysuria. Musculoskeletal: Negative for back pain. Skin: Negative for rash. Neurological: Negative for focal weakness or numbness.  ____________________________________________   PHYSICAL EXAM:  VITAL SIGNS: ED Triage Vitals  Enc Vitals Group     BP 06/06/21 2202 (!) 117/51     Pulse Rate 06/06/21 2202 (!) 105     Resp 06/06/21 2202 18     Temp 06/06/21 2202 99.3 F (37.4 C)     Temp Source 06/06/21 2202 Oral     SpO2 06/06/21 2202 94 %     Weight 06/06/21 2203 235 lb (106.6 kg)     Height 06/06/21 2203 6\' 4"  (1.93 m)     Head Circumference --      Peak Flow --      Pain Score 06/06/21 2202 0     Pain Loc --      Pain Edu? --      Excl. in GC? --    CONSTITUTIONAL: Alert and oriented and  responds appropriately to questions.  Elderly, obese, nontoxic HEAD: Normocephalic EYES: Conjunctivae clear, pupils appear equal, EOM appear intact ENT: normal nose; moist mucous membranes NECK: Supple, normal ROM CARD: Regular and minimally tachycardic; S1 and S2 appreciated; no murmurs, no clicks, no rubs, no gallops RESP: Normal chest excursion without splinting or tachypnea; breath sounds clear and equal bilaterally; no wheezes, no rhonchi, no rales, no hypoxia or respiratory distress, speaking full sentences ABD/GI: Normal bowel sounds; non-distended; soft, non-tender, no rebound, no guarding, no peritoneal signs, no hepatosplenomegaly BACK: The back appears normal EXT: Normal ROM  in all joints; patient has redness, warmth and swelling to the right second toe with an ulcer to the proximal underside of this toe with small amount of purulent drainage.  He has 2+ DP pulse in the right foot.  He is status post left BKA.  No calf tenderness or calf swelling. SKIN: Normal color for age and race; warm; no rash on exposed skin NEURO: Moves all extremities equally PSYCH: The patient's mood and manner are appropriate.        Patient gave verbal permission to utilize photo for medical documentation only. The image was not stored on any personal device.  ____________________________________________   LABS (all labs ordered are listed, but only abnormal results are displayed)  Labs Reviewed  BASIC METABOLIC PANEL - Abnormal; Notable for the following components:      Result Value   Sodium 133 (*)    CO2 20 (*)    Glucose, Bld 196 (*)    BUN 38 (*)    Creatinine, Ser 2.28 (*)    Calcium 8.7 (*)    GFR, Estimated 30 (*)    All other components within normal limits  CBC - Abnormal; Notable for the following components:   RBC 3.73 (*)    Hemoglobin 11.4 (*)    HCT 33.1 (*)    All other components within normal limits  URINALYSIS, COMPLETE (UACMP) WITH MICROSCOPIC - Abnormal; Notable  for the following components:   Color, Urine AMBER (*)    APPearance HAZY (*)    Hgb urine dipstick MODERATE (*)    Protein, ur 30 (*)    Leukocytes,Ua SMALL (*)    All other components within normal limits  CULTURE, BLOOD (ROUTINE X 2)  CULTURE, BLOOD (ROUTINE X 2)  RESP PANEL BY RT-PCR (FLU A&B, COVID) [id number]  AEROBIC/ANAEROBIC CULTURE W GRAM STAIN (SURGICAL/DEEP WOUND)  URINE CULTURE  LACTIC ACID, PLASMA  PROCALCITONIN  HEMOGLOBIN A1C   ____________________________________________  EKG   EKG Interpretation  Date/Time:  Saturday June 06 2021 22:19:55 EDT Ventricular Rate:  104 PR Interval:  184 QRS Duration: 132 QT Interval:  352 QTC Calculation: 462 R Axis:   70 Text Interpretation: Sinus tachycardia Right bundle branch block Abnormal ECG Confirmed by Rochele Raring [phone]) on 06/07/2021 3:03:12 AM        ____________________________________________  RADIOLOGY Normajean Baxter Dhyan Noah, personally viewed and evaluated these images (plain radiographs) as part of my medical decision making, as well as reviewing the written report by the radiologist.  ED MD interpretation: X-ray concerning for osteomyelitis.  Official radiology report(s): DG Foot Complete Right  Result Date: 06/07/2021 CLINICAL DATA:  Right 2nd toe infection EXAM: RIGHT FOOT COMPLETE - 3+ VIEW COMPARISON:  Was FINDINGS: No fracture or dislocation is seen. Mild degenerative changes of the 1st MTP joint. Osteolysis of the 2nd distal phalanx with overlying soft tissue swelling. IMPRESSION: Osteolysis of the 2nd distal phalanx with overlying soft tissue swelling. Electronically Signed   By: Charline Bills M.D.   On: 06/07/2021 03:44    ____________________________________________   PROCEDURES  Procedure(s) performed (including Critical Care):  Procedures   ____________________________________________   INITIAL IMPRESSION / ASSESSMENT AND PLAN / ED COURSE  As part of my medical decision making, I  reviewed the following data within the electronic MEDICAL RECORD NUMBER History obtained from family, Nursing notes reviewed and incorporated, Labs reviewed , EKG interpreted , Old EKG reviewed, Old chart reviewed, Radiograph reviewed , Discussed with admitting physician, records from outside hospital obtained, and Notes  from prior ED visits         Patient here with complaints of a diabetic foot ulcer, cellulitis.  No sign of abscess on exam.  Has been on cephalexin and Bactrim but feels like he is not improving and had an oral temperature tonight of 100.3.  Here patient is afebrile, nontoxic.  Labs obtained in triage show no leukocytosis.  He does have an acute kidney injury.  Last creatinine that we have was in May 2022 and was normal.  We will attempt to obtain records from his ER visit on October 10 in [place].  Will give IV vancomycin, Rocephin.  Will obtain wound and blood cultures.  Will obtain x-ray of the right foot to evaluate for osteomyelitis.  ED PROGRESS    3:10 AM  Discussed patient's case with hospitalist, Dr. Para March.  I have recommended admission and patient (and family if present) agree with this plan. Admitting physician will place admission orders.   I reviewed all nursing notes, vitals, pertinent previous records and reviewed/interpreted all EKGs, lab and urine results, imaging (as available).   3:37 AM [facility]  It appears patient did have blood work drawn that showed a creatinine of 1.5, BUN 25 on 06/01/21.  Normal white blood cell count.  X-ray showed no findings of osteomyelitis.    X-ray here concerning for osteolysis of the second toe.  Lactic normal but procalcitonin slightly elevated.  Urine shows small leukocytes, 11-20 red blood cells and 21-50 white blood cells but no bacteria.  Urine culture is pending.  Patient has already received Rocephin which will cover for possible UTI as  well. ____________________________________________   FINAL CLINICAL IMPRESSION(S) / ED DIAGNOSES  Final diagnoses:  Type 2 diabetes mellitus with right diabetic foot ulcer (HCC)  Cellulitis of second toe of right foot  Generalized weakness  AKI (acute kidney injury) (HCC)  Gross hematuria     ED Discharge Orders     None       *Please note:  RAUDEL BAZEN was evaluated in Emergency Department on 06/07/2021 for the symptoms described in the history of present illness. He was evaluated in the context of the global COVID-19 pandemic, which necessitated consideration that the patient might be at risk for infection with the SARS-CoV-2 virus that causes COVID-19. Institutional protocols and algorithms that pertain to the evaluation of patients at risk for COVID-19 are in a state of rapid change based on information released by regulatory bodies including the CDC and federal and state organizations. These policies and algorithms were followed during the patient's care in the ED.  Some ED evaluations and interventions may be delayed as a result of limited staffing during and the pandemic.*   Note:  This document was prepared using Dragon voice recognition software and may include unintentional dictation errors.    Ona Roehrs, Layla Maw, DO 06/07/21 [phone]

## 2021-06-07 NOTE — ED Notes (Signed)
Pt off unit to OR at this time.

## 2021-06-07 NOTE — ED Notes (Signed)
Portable x-ray at bedside at this time.

## 2021-06-07 NOTE — Consult Note (Signed)
  Subjective:  Patient ID: Daniel Leon, male    DOB: [birth year],  MRN: [id number]  A 70 y.o. male medical history significant for HTN, diet-controlled diabetes, left BKA after failed ankle joint replacement for Charcot ankle collapse, who presents with a 2-week history of pain redness and swelling of the Right second toe infection with osteomyelitis.  Patient states that he went to the emergency room after developed a wound in [place] at that time there was no bone infection they discharged him on antibiotics he was on Bactrim and Keflex.  He is now returning home and it seems like the redness is gotten worse he would like to discuss treatment options he is a controlled diabetic.  He denies any other acute complaints.  No nausea fever chills vomiting. Objective:   Vitals:   06/07/21 0600 06/07/21 0630  BP: (!) 147/73 (!) 148/79  Pulse: 80 79  Resp:  19  Temp:    SpO2: 97% 98%   General AA&O x3. Normal mood and affect.  Vascular Dorsalis pedis and posterior tibial pulses 2/4 bilat. Brisk capillary refill to all digits. Pedal hair present.  Neurologic Epicritic sensation grossly intact.  Dermatologic Right second digit distal tip ulceration probing down to bone consistent with osteomyelitis.  Mild purulent drainage noted.  Redness up to the metatarsophalangeal joint.  Mild malodor present.  No other clinical findings noted.  No crepitus of the metatarsophalangeal joint  Orthopedic: MMT 5/5 in dorsiflexion, plantarflexion, inversion, and eversion. Normal joint ROM without pain or crepitus.    Assessment & Plan:  Patient was evaluated and treated and all questions answered.  Right second digit diabetic foot ulcer with underlying osteomyelitis of the distal phalanx -All questions and concerns were addressed with the patient at bedside -Given the radiographic findings of distal phalanx osteomyelitis I believe patient will benefit from partial second digit amputation of the right  toe.  Given the patient is a controlled diabetic he should be able to heal without acute issues.  Patient has palpable flow to the right lower extremity as well.  I will hold off on further vascular studies. -MRI was reviewed which confirmed osteomyelitis of the distal phalanx with involvement of the middle phalanx.  Unable to appreciate involvement of the proximal phalanx. -Patient can be discharged first thing tomorrow morning on p.o. doxycycline for 14 days. -He will be weightbearing as tolerated in a surgical shoe. -No dressing change until follow-up -He will follow-up in clinic 1 week from discharge to make the appointment.  Candelaria Stagers, DPM  Accessible via secure chat for questions or concerns.

## 2021-06-07 NOTE — Anesthesia Preprocedure Evaluation (Signed)
Anesthesia Evaluation  Patient identified by MRN, date of birth, ID band Patient awake    Reviewed: Allergy & Precautions, H&P , NPO status , Patient's Chart, lab work & pertinent test results, reviewed documented beta blocker date and time   History of Anesthesia Complications Negative for: history of anesthetic complications  Airway Mallampati: III  TM Distance: >3 FB Neck ROM: full    Dental  (+) Caps, Dental Advidsory Given, Teeth Intact   Pulmonary neg pulmonary ROS, former smoker,    Pulmonary exam normal breath sounds clear to auscultation       Cardiovascular Exercise Tolerance: Good hypertension, (-) angina+ Peripheral Vascular Disease  (-) Past MI and (-) Cardiac Stents Normal cardiovascular exam+ dysrhythmias (RBBB) (-) Valvular Problems/Murmurs Rhythm:regular Rate:Normal     Neuro/Psych negative neurological ROS  negative psych ROS   GI/Hepatic negative GI ROS, Neg liver ROS,   Endo/Other  diabetes  Renal/GU negative Renal ROS  negative genitourinary   Musculoskeletal   Abdominal   Peds  Hematology negative hematology ROS (+)   Anesthesia Other Findings Past Medical History: No date: 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 No date: Charcot ankle, left     Comment:  collapse No date: Charcot's joint of foot No date: Diabetes mellitus without complication (HCC)     Comment:  DIET-CONTROLLED No date: Early cataracts, bilateral No date: Essential hypertension No date: Right bundle branch block (RBBB) No date: Traumatic arthropathy of ankle and foot No date: Wears glasses   Reproductive/Obstetrics negative OB ROS                             Anesthesia Physical Anesthesia Plan  ASA: 3  Anesthesia Plan: General   Post-op Pain Management:    Induction: Intravenous  PONV Risk Score and Plan: 2 and TIVA and Propofol infusion  Airway Management Planned:  Natural Airway and Nasal Cannula  Additional Equipment:   Intra-op Plan:   Post-operative Plan:   Informed Consent: I have reviewed the patients History and Physical, chart, labs and discussed the procedure including the risks, benefits and alternatives for the proposed anesthesia with the patient or authorized representative who has indicated his/her understanding and acceptance.     Dental Advisory Given  Plan Discussed with: Anesthesiologist, CRNA and Surgeon  Anesthesia Plan Comments:         Anesthesia Quick Evaluation

## 2021-06-07 NOTE — H&P (Signed)
History and Physical    Daniel Leon:[id number] DOB: 1950-09-13 DOA: 06/07/2021  PCP: Dorcas Carrow, DO   Patient coming from: home  I have personally briefly reviewed patient's old medical records in [facility]  Chief Complaint: redness and swelling right second toe  HPI: Daniel Leon is a 70 y.o. male with medical history significant for HTN, diet-controlled diabetes, left BKA after failed ankle joint replacement for Charcot ankle collapse, who presents with a 2-week history of pain redness and swelling of the left second toe.  He was seen at the [facility] on [facility]10 and started on Bactrim and Keflex but without any significant improvement in the swelling caused the toenail to fall off.Marland Kitchen  He presents to the ED after he developed a fever of 100.3 at home associated with chills and also noted blood in his urine.  He denies nausea, vomiting or abdominal pain and denies diarrhea.  He has had no cough or shortness of breath and no chest pain.  Patient states he had a wound on the same 2 about 10 years ago for which she was seen at the wound care center but it had healed completely and did not have a problem with that until 2 weeks ago  ED course: On arrival temperature 99.3, pulse 105, BP 117/51, O2 sat 94% on room air Blood work with normal WBC of 6700, hemoglobin 11.4.  Creatinine 2.28, up from 1.15 a year ago  EKG, personally viewed and interpreted: Sinus tachycardia at 104 with no acute ST-T wave changes  Imaging: Right foot x-ray with osteolysis of the second distal phalanx with overlying soft tissue swelling.  Patient started on Rocephin and vancomycin.  Also started on IV fluids.  Hospitalist consulted for admission.  Urinalysis pending at time of admission.  Review of Systems: As per HPI otherwise all other systems on review of systems negative.    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    Charcot ankle, left    collapse   Charcot's joint of foot    Diabetes mellitus without complication (HCC)    DIET-CONTROLLED   Early cataracts, bilateral    Essential hypertension    Right bundle branch block (RBBB)    Traumatic arthropathy of ankle and foot    Wears glasses     Past Surgical History:  Procedure Laterality Date   AMPUTATION Left 10/13/2018   Procedure: LEFT BELOW KNEE AMPUTATION;  Surgeon: [name], MD;  Location: [facility];  Service: Orthopedics;  Laterality: Left;   COLONOSCOPY     ELBOW SURGERY Right 1966   EYE SURGERY  03/2019   cataracts- [place]    EYE SURGERY  02/2019   cataracts - [place]   FOOT ARTHRODESIS, SUBTALAR Right 2019   TONSILLECTOMY     TOTAL ANKLE REPLACEMENT     ULNAR NERVE TRANSPOSITION Right 09/05/2015   Procedure: ULNAR NERVE DECOMPRESSION/TRANSPOSITION;  Surgeon: [name], MD;  Location: [facility] ORS;  Service: Orthopedics;  Laterality: Right;     reports that he quit smoking about 20 years ago. His smoking use included cigarettes. He has a 5.00 pack-year smoking history. He has never used smokeless tobacco. He reports current alcohol use. He reports that he does not use drugs.  Allergies  Allergen Reactions   Oxycodone Itching    Family History  Problem Relation Age of Onset   Heart disease Mother  Hypertension Mother    Heart disease Father    Hypertension Father    Hypertension Brother    Prostate cancer Neg Hx    Bladder Cancer Neg Hx    Kidney cancer Neg Hx       Prior to Admission medications   Medication Sig Start Date End Date Taking? Authorizing Provider  Cholecalciferol (VITAMIN D3) 125 MCG (5000 UT) CAPS Take 5,000 Units by mouth daily.    [provider]  diclofenac sodium (VOLTAREN) 1 % GEL Apply 1 application topically 4 (four) times daily as needed (pain).    [provider]  gabapentin (NEURONTIN) 400 MG capsule TAKE 1 CAPSULE BY MOUTH TWICE DAILY. MAY TAKE A  THIRD CAPSULE AS NEEDED FOR PAIN 01/08/21   Olevia Perches P, DO  lisinopril (ZESTRIL) 10 MG tablet Take 1.5 tablets (15 mg total) by mouth daily. 01/08/21   Olevia Perches P, DO  Multiple Vitamin (MULTIVITAMIN) tablet Take 1 tablet by mouth daily.    [provider]    Physical Exam: Vitals:   06/06/21 2202 06/06/21 2203 06/07/21 0106  BP: (!) 117/51  131/79  Pulse: (!) 105  77  Resp: 18  18  Temp: 99.3 F (37.4 C)    TempSrc: Oral    SpO2: 94%  95%  Weight:  106.6 kg   Height:  6\' 4"  (1.93 m)      Vitals:   06/06/21 2202 06/06/21 2203 06/07/21 0106  BP: (!) 117/51  131/79  Pulse: (!) 105  77  Resp: 18  18  Temp: 99.3 F (37.4 C)    TempSrc: Oral    SpO2: 94%  95%  Weight:  106.6 kg   Height:  6\' 4"  (1.93 m)       Constitutional: Alert and oriented x 3 . Not in any apparent distress HEENT:      Head: Normocephalic and atraumatic.         Eyes: PERLA, EOMI, Conjunctivae are normal. Sclera is non-icteric.       Mouth/Throat: Mucous membranes are moist.       Neck: Supple with no signs of meningismus. Cardiovascular: Regular rate and rhythm. No murmurs, gallops, or rubs. 2+ symmetrical distal pulses are present . No JVD. No LE edema Respiratory: Respiratory effort normal .Lungs sounds clear bilaterally. No wheezes, crackles, or rhonchi.  Gastrointestinal: Soft, non tender, and non distended with positive bowel sounds.  Genitourinary: No CVA tenderness. Musculoskeletal: Left BKA.  Redness and swelling right second toe.  Toenail absent.  Pulses +2   neurologic:  Face is symmetric. Moving all extremities. No gross focal neurologic deficits . Skin: Skin is warm, dry.  No rash or ulcers Psychiatric: Mood and affect are normal    Labs on Admission: I have personally reviewed following labs and imaging studies  CBC: Recent Labs  Lab 06/06/21 2212  WBC 6.7  HGB 11.4*  HCT 33.1*  MCV 88.7  PLT 203   Basic Metabolic Panel: Recent Labs  Lab 06/06/21 2212   NA 133*  K 4.8  CL 103  CO2 20*  GLUCOSE 196*  BUN 38*  CREATININE 2.28*  CALCIUM 8.7*   GFR: Estimated Creatinine Clearance: 39.8 mL/min (A) (by C-G formula based on SCr of 2.28 mg/dL (H)). Liver Function Tests: No results for input(s): AST, ALT, ALKPHOS, BILITOT, PROT, ALBUMIN in the last 168 hours. No results for input(s): LIPASE, AMYLASE in the last 168 hours. No results for input(s): AMMONIA in the last 168 hours. Coagulation  Profile: No results for input(s): INR, PROTIME in the last 168 hours. Cardiac Enzymes: No results for input(s): CKTOTAL, CKMB, CKMBINDEX, TROPONINI in the last 168 hours. BNP (last 3 results) No results for input(s): PROBNP in the last 8760 hours. HbA1C: No results for input(s): HGBA1C in the last 72 hours. CBG: No results for input(s): GLUCAP in the last 168 hours. Lipid Profile: No results for input(s): CHOL, HDL, LDLCALC, TRIG, CHOLHDL, LDLDIRECT in the last 72 hours. Thyroid Function Tests: No results for input(s): TSH, T4TOTAL, FREET4, T3FREE, THYROIDAB in the last 72 hours. Anemia Panel: No results for input(s): VITAMINB12, FOLATE, FERRITIN, TIBC, IRON, RETICCTPCT in the last 72 hours. Urine analysis:    Component Value Date/Time   APPEARANCEUR Clear 01/08/2021 1428   GLUCOSEU Negative 01/08/2021 1428   BILIRUBINUR Negative 01/08/2021 1428   PROTEINUR Negative 01/08/2021 1428   NITRITE Negative 01/08/2021 1428   LEUKOCYTESUR Negative 01/08/2021 1428    Radiological Exams on Admission: DG Foot Complete Right  Result Date: 06/07/2021 CLINICAL DATA:  Right 2nd toe infection EXAM: RIGHT FOOT COMPLETE - 3+ VIEW COMPARISON:  Was FINDINGS: No fracture or dislocation is seen. Mild degenerative changes of the 1st MTP joint. Osteolysis of the 2nd distal phalanx with overlying soft tissue swelling. IMPRESSION: Osteolysis of the 2nd distal phalanx with overlying soft tissue swelling. Electronically Signed   By: Charline Bills M.D.   On:  06/07/2021 03:44     Assessment/Plan    Cellulitis of second toe of right foot /diabetic foot infection -IV Rocephin and vancomycin - Podiatry consult    AKI (acute kidney injury) (HCC) - Creatinine elevated at 2.28 above baseline of 1.15 - Suspect related to Bactrim, possibly dehydration - Consider renal ultrasound to evaluate for obstructive uropathy given hematuria - IV hydration and monitor and avoid nephrotoxins.  Renally adjust all medication - We will hold lisinopril and gabapentin - Consider renal ultrasound given the patient's of blood in the urine    Hematuria - Patient reports seeing blood in his urine on the day of arrival - Follow UA  - Consider renal ultrasound    Essential hypertension - Blood pressure controlled - Hold lisinopril for now    DM type 2 with diabetic peripheral neuropathy (HCC) - Patient is diet controlled - AC sliding scale coverage    History of below-knee amputation of left lower extremity -Secondary to failed ankle joint replacement related to Charcot ankle collapse    DVT prophylaxis: SCDs given hematuria Code Status: full code  Family Communication:  none  Disposition Plan: Back to previous home environment Consults called: Podiatry Status:At the time of admission, it appears that the appropriate admission status for this patient is INPATIENT. This is judged to be reasonable and necessary in order to provide the required intensity of service to ensure the patient's safety given the presenting symptoms, physical exam findings, and initial radiographic and laboratory data in the context of their  Comorbid conditions.   Patient requires inpatient status due to high intensity of service, high risk for further deterioration and high frequency of surveillance required.   I certify that at the point of admission it is my clinical judgment that the patient will require inpatient hospital care spanning beyond 2 midnights     Daniel Baumann  MD [facility]     06/07/2021, 3:54 AM

## 2021-06-07 NOTE — Progress Notes (Signed)
Pharmacy Antibiotic Note  DUTCH ING is a 70 y.o. male admitted on 06/07/2021 with cellulitis.  Pharmacy has been consulted for Vancomycin dosing.  Plan: Vancomycin 2250 mg IV X 1 ordered to be given on 10/16 @ ~ 0500.  Vancomycin 1250 mg IV Q24H ordered to start on 10/17 @ ~ 0500.  AUC = 469.6 Vanc trough = 13.2   Height: 6\' 4"  (193 cm) Weight: 106.6 kg (235 lb) IBW/kg (Calculated) : 86.8  Temp (24hrs), Avg:99.3 F (37.4 C), Min:99.3 F (37.4 C), Max:99.3 F (37.4 C)  Recent Labs  Lab 06/06/21 2212  WBC 6.7  CREATININE 2.28*    Estimated Creatinine Clearance: 39.8 mL/min (A) (by C-G formula based on SCr of 2.28 mg/dL (H)).    Allergies  Allergen Reactions   Oxycodone Itching    Antimicrobials this admission:   >>    >>   Dose adjustments this admission:   Microbiology results:  BCx:   UCx:    Sputum:    MRSA PCR:   Thank you for allowing pharmacy to be a part of this patient's care.  Katheren Jimmerson D 06/07/2021 4:34 AM

## 2021-06-07 NOTE — Interval H&P Note (Signed)
History and Physical Interval Note:  06/07/2021 9:34 AM  Daniel Leon  has presented today for surgery, with the diagnosis of n/a.  The various methods of treatment have been discussed with the patient and family. After consideration of risks, benefits and other options for treatment, the patient has consented to  Procedure(s): AMPUTATION TOE (Right) as a surgical intervention.  The patient's history has been reviewed, patient examined, no change in status, stable for surgery.  I have reviewed the patient's chart and labs.  Questions were answered to the patient's satisfaction.     Candelaria Stagers

## 2021-06-07 NOTE — ED Notes (Signed)
Pt undressed and pt in gown, assisted to BR

## 2021-06-07 NOTE — Progress Notes (Signed)
Anticoagulation monitoring(Lovenox):  70 yo male ordered Lovenox 30 mg Q24h    Filed Weights   06/06/21 2203  Weight: 106.6 kg (235 lb)   BMI 28.6   Lab Results  Component Value Date   CREATININE 2.28 (H) 06/06/2021   CREATININE 1.15 01/08/2021   CREATININE 1.19 06/19/2020   Estimated Creatinine Clearance: 39.8 mL/min (A) (by C-G formula based on SCr of 2.28 mg/dL (H)). Hemoglobin & Hematocrit     Component Value Date/Time   HGB 11.4 (L) 06/06/2021 2212   HGB 13.0 01/08/2021 1433   HCT 33.1 (L) 06/06/2021 2212   HCT 38.7 01/08/2021 1433     Per Protocol for Patient with estCrcl > 30 ml/min and BMI > 30, will transition to Lovenox 40 mg Q24h.

## 2021-06-07 NOTE — ED Notes (Signed)
Report to [facility], surgical team

## 2021-06-07 NOTE — Progress Notes (Signed)
PHARMACY -  BRIEF ANTIBIOTIC NOTE   Pharmacy has received consult(s) for Vancomycin from an ED provider.  The patient's profile has been reviewed for ht/wt/allergies/indication/available labs.    One time order(s) placed for Vancomycin 2250 mg IV X 1  Further antibiotics/pharmacy consults should be ordered by admitting physician if indicated.                       Thank you, Suhani Stillion D 06/07/2021  3:19 AM

## 2021-06-08 ENCOUNTER — Ambulatory Visit: Payer: Medicare PPO | Admitting: Family Medicine

## 2021-06-08 ENCOUNTER — Encounter: Payer: Self-pay | Admitting: Podiatry

## 2021-06-08 DIAGNOSIS — E1142 Type 2 diabetes mellitus with diabetic polyneuropathy: Secondary | ICD-10-CM

## 2021-06-08 DIAGNOSIS — Z89512 Acquired absence of left leg below knee: Secondary | ICD-10-CM | POA: Diagnosis not present

## 2021-06-08 DIAGNOSIS — I10 Essential (primary) hypertension: Secondary | ICD-10-CM

## 2021-06-08 DIAGNOSIS — R319 Hematuria, unspecified: Secondary | ICD-10-CM | POA: Diagnosis not present

## 2021-06-08 DIAGNOSIS — N179 Acute kidney failure, unspecified: Secondary | ICD-10-CM | POA: Diagnosis not present

## 2021-06-08 DIAGNOSIS — L03031 Cellulitis of right toe: Secondary | ICD-10-CM | POA: Diagnosis not present

## 2021-06-08 LAB — HEMOGLOBIN A1C
Hgb A1c MFr Bld: 6 % — ABNORMAL HIGH (ref 4.8–5.6)
Mean Plasma Glucose: 126 mg/dL

## 2021-06-08 LAB — URINE CULTURE: Culture: NO GROWTH

## 2021-06-08 LAB — BASIC METABOLIC PANEL
Anion gap: 5 (ref 5–15)
BUN: 26 mg/dL — ABNORMAL HIGH (ref 8–23)
CO2: 22 mmol/L (ref 22–32)
Calcium: 8.4 mg/dL — ABNORMAL LOW (ref 8.9–10.3)
Chloride: 104 mmol/L (ref 98–111)
Creatinine, Ser: 1.39 mg/dL — ABNORMAL HIGH (ref 0.61–1.24)
GFR, Estimated: 54 mL/min — ABNORMAL LOW (ref >60)
Glucose, Bld: 141 mg/dL — ABNORMAL HIGH (ref 70–99)
Potassium: 4.4 mmol/L (ref 3.5–5.1)
Sodium: 131 mmol/L — ABNORMAL LOW (ref 135–145)

## 2021-06-08 LAB — GLUCOSE, CAPILLARY: Glucose-Capillary: 118 mg/dL — ABNORMAL HIGH (ref 70–99)

## 2021-06-08 MED ORDER — HYDROCODONE-ACETAMINOPHEN 5-325 MG PO TABS: 1.0000 | ORAL_TABLET | Freq: Four times a day (QID) | ORAL | 0 refills | Status: AC | PRN

## 2021-06-08 MED ORDER — ONDANSETRON HCL 4 MG PO TABS: 4.0000 mg | ORAL_TABLET | Freq: Three times a day (TID) | ORAL | 0 refills | Status: DC | PRN

## 2021-06-08 MED ORDER — DOXYCYCLINE HYCLATE 100 MG PO TABS: 100.0000 mg | ORAL_TABLET | Freq: Two times a day (BID) | ORAL | 0 refills | Status: DC

## 2021-06-08 MED ORDER — VANCOMYCIN HCL 1250 MG/250ML IV SOLN
1250.0000 mg | Freq: Two times a day (BID) | INTRAVENOUS | Status: DC
  Filled 2021-06-08: qty 250

## 2021-06-08 MED ORDER — ACETAMINOPHEN 325 MG PO TABS: 650.0000 mg | ORAL_TABLET | Freq: Four times a day (QID) | ORAL | 0 refills | Status: DC | PRN

## 2021-06-08 NOTE — Evaluation (Addendum)
Occupational Therapy Evaluation Patient Details Name: Daniel Leon MRN: [id number] DOB: 12-12-1950 Today's Date: 06/08/2021   History of Present Illness 70 y.o. male medical history significant for HTN, diet-controlled diabetes, left BKA after failed ankle joint replacement for Charcot ankle collapse, who presents with a 2-week history of pain redness and swelling of the Right second toe infection with osteomyelitis now s/p R toe amputation on 06/07/21.   Clinical Impression   Mr Wieneke was seen for OT evaluation this date. Prior to hospital admission, pt was MOD I using LLE prosthetic for I/ADLs and mobility including driving/working, uses SPC as needed. Pt lives with spouse in 1 level home and level entry. Pt presents to acute OT demonstrating impaired ADL performance and functional mobility 2/2 decreased activity tolerance and functional strength deficits. Upon arrival pt seated on [facility] with RN in room, no post op shoe in room.   RN requested assistance with BSC t/f - pt currently requires MIN A + RW for BSC t/f - assist for lift off only (anticipate improvement with shoe, attempted to minimize WBing to RLE without post op shoe). MOD I perihygiene with lateral leans. SUPERVISION for LB access at EOB. Pt reports fatigue, requesting return to bed and deferred further mobility at this time. Pt would benefit from skilled OT to address noted impairments and functional limitations (see below for any additional details) in order to maximize safety and independence while minimizing falls risk and caregiver burden. Upon hospital discharge anticipate no OT needs, pt has all DME equipment needed.       Recommendations for follow up therapy are one component of a multi-disciplinary discharge planning process, led by the attending physician.  Recommendations may be updated based on patient status, additional functional criteria and insurance authorization.   Follow Up Recommendations  No OT follow  up;Supervision - Intermittent    Equipment Recommendations  None recommended by OT (pt has all DME needs)    Recommendations for Other Services       Precautions / Restrictions Precautions Precautions: Fall Restrictions Weight Bearing Restrictions: Yes RLE Weight Bearing: Weight bearing as tolerated Other Position/Activity Restrictions: in post op shoe      Mobility Bed Mobility Overal bed mobility: Modified Independent                  Transfers Overall transfer level: Needs assistance Equipment used: Rolling walker (2 wheeled) Transfers: Sit to/from [employer];Lateral/Scoot Transfers Sit to Stand: Min assist Stand pivot transfers: Supervision      Lateral/Scoot Transfers: Supervision      Balance Overall balance assessment: Needs assistance Sitting-balance support: No upper extremity supported;Feet supported Sitting balance-Leahy Scale: Good     Standing balance support: Bilateral upper extremity supported Standing balance-Leahy Scale: Good                             ADL either performed or assessed with clinical judgement   ADL Overall ADL's : Needs assistance/impaired                                       General ADL Comments: MIN A + RW for BSC t/f - assist for lift off only (no post op shoe in room, anticipate improvement with shoe). MOD I perihygiene with lateral leans. SUPERVISION for LB access at EOB.      Pertinent Vitals/Pain Pain  Assessment: No/denies pain     Hand Dominance Right   Extremity/Trunk Assessment Upper Extremity Assessment Upper Extremity Assessment: Overall WFL for tasks assessed   Lower Extremity Assessment Lower Extremity Assessment: RLE deficits/detail;LLE deficits/detail RLE Deficits / Details: s/p R 2nd toe amputation, generalized weakness LLE Deficits / Details: chronic L BKA with prosthetic       Communication Communication Communication: No difficulties    Cognition Arousal/Alertness: Awake/alert Behavior During Therapy: WFL for tasks assessed/performed Overall Cognitive Status: Within Functional Limits for tasks assessed                                     General Comments       Exercises Exercises: Other exercises Other Exercises Other Exercises: Pt educated re: OT role, DME recs, d/c recs, falls prevention, adapted dressing technqiues, home/routines modifications Other Exercises: LBD, toileting, sit<>stand, SPT, sitting/standing balance/tolerance, lateral scoot   Shoulder Instructions      Home Living Family/patient expects to be discharged to:: Private residence Living Arrangements: Spouse/significant other Available Help at Discharge: Family;Available 24 hours/day Type of Home: House Home Access: Level entry     Home Layout: One level     Bathroom Shower/Tub: Producer, television/film/video: Handicapped height Bathroom Accessibility: Yes   Home Equipment: Environmental consultant - 2 wheels;Cane - single point;Bedside commode;Shower seat;Grab bars - toilet;Grab bars - tub/shower   Additional Comments: wife 24.7, son lives nearby      Prior Functioning/Environment Level of Independence: Independent with assistive device(s)        Comments: 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, research (medical) (able to complete seated), drives, MOD I using LLE prosthetic, SPC as needed        OT Problem List: Decreased strength;Decreased activity tolerance;Impaired balance (sitting and/or standing)      OT Treatment/Interventions: Self-care/ADL training;Therapeutic exercise;Energy conservation;DME and/or AE instruction;Therapeutic activities;Patient/family education;Balance training    OT Goals(Current goals can be found in the care plan section) Acute Rehab OT Goals Patient Stated Goal: to feel better and then go home OT Goal Formulation: With patient Time For Goal Achievement: 06/22/21 Potential to Achieve Goals: Good ADL Goals Pt  Will Perform Grooming: with modified independence;standing (c LRAD PRN) Pt Will Perform Lower Body Dressing: with modified independence;sit to/from stand (c LRAD PRN) Pt Will Transfer to Toilet: with modified independence;ambulating;regular height toilet (c LRAD PRN)  OT Frequency: Min 1X/week    AM-PAC OT "6 Clicks" Daily Activity     Outcome Measure Help from another person eating meals?: None Help from another person taking care of personal grooming?: None Help from another person toileting, which includes using toliet, bedpan, or urinal?: A Little Help from another person bathing (including washing, rinsing, drying)?: None Help from another person to put on and taking off regular upper body clothing?: None Help from another person to put on and taking off regular lower body clothing?: None 6 Click Score: 23   End of Session Equipment Utilized During Treatment: Rolling walker Nurse Communication: Mobility status;Precautions;Weight bearing status  Activity Tolerance: Patient tolerated treatment well Patient left: in bed;with call bell/phone within reach  OT Visit Diagnosis: Other abnormalities of gait and mobility (R26.89)                Time: 1062-6948 OT Time Calculation (min): 18 min Charges:  OT General Charges $OT Visit: 1 Visit OT Evaluation $OT Eval Low Complexity: 1 Low  Kathie Dike, M.S. OTR/L  06/08/21, 10:22 AM  ascom [phone]

## 2021-06-08 NOTE — Plan of Care (Signed)
  Problem: Education: Goal: Knowledge of the prescribed therapeutic regimen will improve Outcome: Progressing Goal: Ability to verbalize activity precautions or restrictions will improve Outcome: Progressing Goal: Understanding of discharge needs will improve Outcome: Progressing   Problem: Activity: Goal: Ability to perform//tolerate increased activity and mobilize with assistive devices will improve Outcome: Progressing   Problem: Clinical Measurements: Goal: Postoperative complications will be avoided or minimized Outcome: Progressing   Problem: Self-Care: Goal: Ability to meet self-care needs will improve Outcome: Progressing   Problem: Pain Management: Goal: Pain level will decrease with appropriate interventions Outcome: Progressing   Problem: Skin Integrity: Goal: Demonstration of wound healing without infection will improve Outcome: Progressing

## 2021-06-08 NOTE — Care Management CC44 (Signed)
Condition Code 44 Documentation Completed  Patient Details  Name: WYNTON HUFSTETLER MRN: [id number] Date of Birth: 05/03/1951   Condition Code 44 given:  Yes Patient signature on Condition Code 44 notice:  Yes Documentation of 2 MD's agreement:  Yes Code 44 added to claim:  Yes    Reuel Boom Marlee Trentman, LCSW 06/08/2021, 2:25 PM

## 2021-06-08 NOTE — Progress Notes (Signed)
Occupational Therapy Treatment Patient Details Name: Daniel Leon MRN: [id number] DOB: 07-Mar-1951 Today's Date: 06/08/2021   History of present illness 70 y.o. male medical history significant for HTN, diet-controlled diabetes, left BKA after failed ankle joint replacement for Charcot ankle collapse, who presents with a 2-week history of pain redness and swelling of the Right second toe infection with osteomyelitis now s/p R toe amputation on 06/07/21.   OT comments  Daniel Leon was seen for OT treatment at family and RN request. Upon arrival to room pt seated EOB with wife present (recently completed bathing/dressing bed level) and prosthetic donned. Pt and family instructed on functional application of Wbing precautions, home/routine modifications, car t/f technique, HEP, and DME/discharge recs.   Pt currently requires MIN A don post op shoe seated EOB, assist for straps. SUPERVISION + RW for ADL t/f from high bed height and low chair height, initial cues for hand placement with good carryover. Pt completed exercises as listed below. Pt making good progress toward goals. Will continue to follow POC. Discharge recommendation remains appropriate.     Recommendations for follow up therapy are one component of a multi-disciplinary discharge planning process, led by the attending physician.  Recommendations may be updated based on patient status, additional functional criteria and insurance authorization.    Follow Up Recommendations  No OT follow up;Supervision - Intermittent    Equipment Recommendations  None recommended by OT    Recommendations for Other Services      Precautions / Restrictions Precautions Precautions: Fall Restrictions Weight Bearing Restrictions: Yes RLE Weight Bearing: Weight bearing as tolerated Other Position/Activity Restrictions: in post op shoe       Mobility Bed Mobility Overal bed mobility: Modified Independent                   Transfers Overall transfer level: Needs assistance Equipment used: Rolling walker (2 wheeled) Transfers: Sit to/from Stand Sit to Stand: Supervision              Balance Overall balance assessment: Needs assistance Sitting-balance support: No upper extremity supported;Feet supported Sitting balance-Leahy Scale: Good     Standing balance support: No upper extremity supported;During functional activity Standing balance-Leahy Scale: Good Standing balance comment: reaching inside BOS                           ADL either performed or assessed with clinical judgement   ADL Overall ADL's : Needs assistance/impaired                                       General ADL Comments: MIN A don post op shoe seated EOB, assist for tightening straps. SUPERVISION + RW for ADL t/f from high bed height and low chair height.      Cognition Arousal/Alertness: Awake/alert Behavior During Therapy: WFL for tasks assessed/performed Overall Cognitive Status: Within Functional Limits for tasks assessed                                          Exercises Exercises: Other exercises;General Lower Extremity General Exercises - Lower Extremity Ankle Circles/Pumps: Strengthening;Both;5 reps;Seated Quad Sets: Strengthening;Both;5 reps;Seated Gluteal Sets: Strengthening;Both;5 reps;Seated Long Arc Quad: Strengthening;Both;5 reps;Seated Hip ABduction/ADduction: Strengthening;Both;5 reps;Seated Hip Flexion/Marching: Strengthening;Both;5 reps;Seated Toe Raises: Strengthening;Both;5 reps;Seated Other  Exercises Other Exercises: Pt and family educated re: DME recs, d/c recs, falls prevention, HEP, home/routines modifications Other Exercises: LBD, sit<>stand, sitting/standing balance/tolerance, ~20 ft mobility           Pertinent Vitals/ Pain       Pain Assessment: No/denies pain   Frequency  Min 1X/week        Progress Toward Goals  OT  Goals(current goals can now be found in the care plan section)  Progress towards OT goals: Progressing toward goals  Acute Rehab OT Goals Patient Stated Goal: to feel better and then go home OT Goal Formulation: With patient Time For Goal Achievement: 06/22/21 Potential to Achieve Goals: Good ADL Goals Pt Will Perform Grooming: with modified independence;standing Pt Will Perform Lower Body Dressing: with modified independence;sit to/from stand Pt Will Transfer to Toilet: with modified independence;ambulating;regular height toilet  Plan Discharge plan remains appropriate;Frequency remains appropriate    Co-evaluation                 AM-PAC OT "6 Clicks" Daily Activity     Outcome Measure   Help from another person eating meals?: None Help from another person taking care of personal grooming?: None Help from another person toileting, which includes using toliet, bedpan, or urinal?: None Help from another person bathing (including washing, rinsing, drying)?: None Help from another person to put on and taking off regular upper body clothing?: None Help from another person to put on and taking off regular lower body clothing?: None 6 Click Score: 24    End of Session Equipment Utilized During Treatment: Rolling walker  OT Visit Diagnosis: Other abnormalities of gait and mobility (R26.89)   Activity Tolerance Patient tolerated treatment well   Patient Left in bed;with call bell/phone within reach;with family/visitor present   Nurse Communication Mobility status        Time: 1346-1411 OT Time Calculation (min): 25 min  Charges: OT General Charges $OT Visit: 1 Visit OT Treatments $Self Care/Home Management : 8-22 mins $Therapeutic Exercise: 8-22 mins  Kathie Dike, M.S. OTR/L  06/08/21, 2:34 PM  ascom [phone]

## 2021-06-08 NOTE — Care Management Obs Status (Signed)
MEDICARE OBSERVATION STATUS NOTIFICATION   Patient Details  Name: Daniel Leon MRN: [id number] Date of Birth: 1950-09-29   Medicare Observation Status Notification Given:  Yes    Tamer Baughman A Rjay Revolorio, LCSW 06/08/2021, 2:25 PM

## 2021-06-08 NOTE — Discharge Summary (Addendum)
Physician Discharge Summary  [name] WFU:[id number] DOB: [birth year] DOA: 06/07/2021  PCP: Dorcas Carrow, DO  Admit date: 06/07/2021 Discharge date: 06/08/2021  Admitted From: Home  Discharge disposition: Home  Recommendations for Outpatient Follow-Up:   Follow up with your primary care provider in one week.  Follow-up with podiatry Dr Allena Katz as outpatient in 1 week.  Keep the dressing until seen by Dr. Allena Katz as outpatient. Check CBC, BMP, magnesium in the next visit Patient had mild hematuria during hospitalization.  Please follow-up as outpatient.  Discharge Diagnosis:   Principal Problem:   Cellulitis of second toe of right foot Active Problems:   Essential hypertension   DM type 2 with diabetic peripheral neuropathy (HCC)   History of below-knee amputation of left lower extremity (HCC)   AKI (acute kidney injury) (HCC)   Hematuria  Discharge Condition: Improved.  Diet recommendation: Low sodium, heart healthy.  Carbohydrate-modified.    Wound care: Continue dressing until seen by podiatry.  Code status: Full.  History of Present Illness:   Daniel Leon is a 70 y.o. male with past medical history of hypertension, diet-controlled diabetes, left BKA for failed joint replacement from charcot joint collapse presented to the hospital with 2-week history of pain, redness and swelling of the left second toe.  He was given Bactrim and Keflex as outpatient but did not improve, so he decided to come to the hospital.  He also had a low-grade fever and chills in the ED and had episode of hematuria.  In the ED, patient had normal white blood cell count.  Creatinine was 2.2 up from 1.15.  EKG showed sinus tachycardia.  X-ray of the foot showed osteolysis of the second distal phalanx with overlying soft tissue swelling.  Patient was started on Rocephin and vancomycin and was admitted to hospital for further evaluation and treatment.    Hospital Course:   Following  conditions were addressed during hospitalization as listed below,  Cellulitis of second toe of right foot /diabetic foot infection Patient received IV Rocephin and vancomycin.  Podiatry was consulted and patient underwent the right second toe amputation.  Podiatry has recommended outpatient follow-up in 1 week with 14-day course of doxycycline.  No dressing change was advised until follow-up.  Blood culture and urine culture negative until the time of discharge.  Acute kidney injury Creatinine elevated at 2.28 above baseline of 1.15 on presentation.  Thought to be secondary to Bactrim and dehydration.  Improved prior to discharge.  Creatinine prior to discharge was 1.3     Hematuria 1 episode.  Could be UTI with hematuria.  11-20 RBCs with 21-50 white cells with proteinuria and hazy appearance.  Will need outpatient follow-up patient received IV antibiotic during hospitalization and will be transitioned to oral antibiotic on discharge.  Urine culture negative at the time of discharge.     Essential hypertension Patient was on lisinopril at home.  This will be resumed on discharge.    DM type 2 with diabetic peripheral neuropathy  Diet controlled.     History of below-knee amputation of left lower extremity -Secondary to failed ankle joint replacement related to Charcot ankle collapse.  He uses a prosthesis at home.   Disposition.  At this time, patient is stable for disposition home with outpatient PCP and podiatry follow-up.  Medical Consultants:   Podiatry  Procedures:    Right second toe amputation on 06/07/2021 by Dr. Nicholes Rough Subjective:   Today, patient was seen and examined at  bedside.  Denies any pain, nausea or vomiting but feels little tired and fatigued  Discharge Exam:   Vitals:   06/08/21 0358 06/08/21 0849  BP: (!) 146/81 134/90  Pulse: (!) 102 84  Resp: 20 20  Temp: 98.8 F (37.1 C) 98.2 F (36.8 C)  SpO2: 96% 96%   Vitals:   06/07/21 1552 06/07/21  1917 06/08/21 0358 06/08/21 0849  BP: 131/69 116/68 (!) 146/81 134/90  Pulse: 97 86 (!) 102 84  Resp: 18 20 20 20   Temp: 98 F (36.7 C) 98.6 F (37 C) 98.8 F (37.1 C) 98.2 F (36.8 C)  TempSrc: Oral Oral Oral Oral  SpO2: 94% 96% 96% 96%  Weight:      Height:       Body mass index is 28.61 kg/m.   General: Alert awake, not in obvious distress HENT: pupils equally reacting to light,  No scleral pallor or icterus noted. Oral mucosa is moist.  Chest:  Clear breath sounds.  Diminished breath sounds bilaterally. No crackles or wheezes.  CVS: S1 &S2 heard. No murmur.  Regular rate and rhythm. Abdomen: Soft, nontender, nondistended.  Bowel sounds are heard.   Extremities: No cyanosis, clubbing or edema.  Right second toe amputation with overlying dressing. Psych: Alert, awake and oriented, normal mood CNS:  No cranial nerve deficits.  Power equal in all extremities.   Skin: Warm and dry.  Status post amputation with dressing.  The results of significant diagnostics from this hospitalization (including imaging, microbiology, ancillary and laboratory) are listed below for reference.     Diagnostic Studies:   MR FOOT RIGHT WO CONTRAST  Result Date: 06/07/2021 CLINICAL DATA:  Osteomyelitis right second toe EXAM: MRI OF THE RIGHT FOREFOOT WITHOUT CONTRAST TECHNIQUE: Multiplanar, multisequence MR imaging of the right forefoot was performed. No intravenous contrast was administered. COMPARISON:  X-ray 06/07/2021 FINDINGS: Bones/Joint/Cartilage Cortical destruction of the distal phalanx of the right second toe. Bone marrow edema throughout the middle phalanx of the second toe with associated confluent low T1 marrow signal. Preserved bone marrow signal intensity within the proximal phalanx of the second toe. Large complex first MTP joint effusion with multiple erosions of the first metatarsal head (series 7, images 12-13). Patchy bone marrow edema within the first metatarsal head and neck. Smaller  erosions involve the base of the great toe proximal phalanx. Mild bone marrow edema within the proximal diaphysis of the fifth metatarsal without associated T1 marrow signal abnormality. Moderate degenerative changes throughout the foot. Reactive subchondral marrow signal changes are seen within the midfoot, most pronounced at the second tarsometatarsal joint. Ligaments Intact Lisfranc ligament. Marked capsular thickening of the first MTP joint. Muscles and Tendons Chronic denervation changes of the intrinsic foot musculature. No tenosynovial fluid collection. Soft tissues Generalized soft tissue edema.  No organized fluid collection. IMPRESSION: 1. Acute osteomyelitis of the distal phalanx and middle phalanx of the right second toe. 2. Large complex first MTP joint effusion with multiple erosions of the first metatarsal head and base of the great toe proximal phalanx. Findings may reflect an underlying inflammatory or crystalline arthropathy such as gout. Septic arthritis with osteomyelitis are not excluded. 3. Mild bone marrow edema within the proximal diaphysis of the fifth metatarsal without associated T1 marrow signal abnormality. Findings are favored to represent stress related changes. No definite evidence of osteomyelitis at this time. 4. Moderate degenerative changes throughout the foot. Electronically Signed   By: 06/09/2021 D.O.   On: 06/07/2021 09:53   DG  Foot Complete Right  Result Date: 06/07/2021 CLINICAL DATA:  Postop radiograph EXAM: RIGHT FOOT COMPLETE - 3+ VIEW COMPARISON:  Right foot radiographs 06/07/2021 FINDINGS: Status post amputation of the second toe at the level of the proximal phalanx mid shaft. No acute bony finding. There are postoperative changes in the regional soft tissues. IMPRESSION: Status post amputation of the second toe at the level of the proximal phalanx. Electronically Signed   By: Emmaline Kluver M.D.   On: 06/07/2021 10:46   DG Foot Complete Right  Result  Date: 06/07/2021 CLINICAL DATA:  Right 2nd toe infection EXAM: RIGHT FOOT COMPLETE - 3+ VIEW COMPARISON:  Was FINDINGS: No fracture or dislocation is seen. Mild degenerative changes of the 1st MTP joint. Osteolysis of the 2nd distal phalanx with overlying soft tissue swelling. IMPRESSION: Osteolysis of the 2nd distal phalanx with overlying soft tissue swelling. Electronically Signed   By: Charline Bills M.D.   On: 06/07/2021 03:44     Labs:   Basic Metabolic Panel: Recent Labs  Lab 06/06/21 2212 06/08/21 0532  NA 133* 131*  K 4.8 4.4  CL 103 104  CO2 20* 22  GLUCOSE 196* 141*  BUN 38* 26*  CREATININE 2.28* 1.39*  CALCIUM 8.7* 8.4*   GFR Estimated Creatinine Clearance: 65.3 mL/min (A) (by C-G formula based on SCr of 1.39 mg/dL (H)). Liver Function Tests: No results for input(s): AST, ALT, ALKPHOS, BILITOT, PROT, ALBUMIN in the last 168 hours. No results for input(s): LIPASE, AMYLASE in the last 168 hours. No results for input(s): AMMONIA in the last 168 hours. Coagulation profile No results for input(s): INR, PROTIME in the last 168 hours.  CBC: Recent Labs  Lab 06/06/21 2212  WBC 6.7  HGB 11.4*  HCT 33.1*  MCV 88.7  PLT 203   Cardiac Enzymes: No results for input(s): CKTOTAL, CKMB, CKMBINDEX, TROPONINI in the last 168 hours. BNP: Invalid input(s): POCBNP CBG: Recent Labs  Lab 06/07/21 0835 06/07/21 1023 06/07/21 1635 06/08/21 0853  GLUCAP 122* 138* 124* 118*   D-Dimer No results for input(s): DDIMER in the last 72 hours. Hgb A1c Recent Labs    06/06/21 2212  HGBA1C 6.0*   Lipid Profile No results for input(s): CHOL, HDL, LDLCALC, TRIG, CHOLHDL, LDLDIRECT in the last 72 hours. Thyroid function studies No results for input(s): TSH, T4TOTAL, T3FREE, THYROIDAB in the last 72 hours.  Invalid input(s): FREET3 Anemia work up No results for input(s): VITAMINB12, FOLATE, FERRITIN, TIBC, IRON, RETICCTPCT in the last 72 hours. Microbiology Recent Results  (from the past 240 hour(s))  Culture, blood (Routine X 2) w Reflex to ID Panel     Status: None (Preliminary result)   Collection Time: 06/07/21  3:01 AM   Specimen: Right Antecubital; Blood  Result Value Ref Range Status   Specimen Description RIGHT ANTECUBITAL  Final   Special Requests   Final    BOTTLES DRAWN AEROBIC AND ANAEROBIC Blood Culture results may not be optimal due to an excessive volume of blood received in culture bottles   Culture   Final    NO GROWTH 1 DAY Performed at [facility], [address] [place], [place] [address]    Report Status PENDING  Incomplete  Resp Panel by RT-PCR (Flu A&B, Covid) Nasopharyngeal Swab     Status: None   Collection Time: 06/07/21  3:02 AM   Specimen: Nasopharyngeal Swab; Nasopharyngeal(NP) swabs in vial transport medium  Result Value Ref Range Status   SARS Coronavirus 2 by RT  PCR NEGATIVE NEGATIVE Final    Comment: (NOTE) SARS-CoV-2 target nucleic acids are NOT DETECTED.  The SARS-CoV-2 RNA is generally detectable in upper respiratory specimens during the acute phase of infection. The lowest concentration of SARS-CoV-2 viral copies this assay can detect is 138 copies/mL. A negative result does not preclude SARS-Cov-2 infection and should not be used as the sole basis for treatment or other patient management decisions. A negative result may occur with  improper specimen collection/handling, submission of specimen other than nasopharyngeal swab, presence of viral mutation(s) within the areas targeted by this assay, and inadequate number of viral copies(<138 copies/mL). A negative result must be combined with clinical observations, patient history, and epidemiological information. The expected result is Negative.  Fact Sheet for Patients:  BloggerCourse.com  Fact Sheet for Healthcare Providers:  SeriousBroker.it  This test is no t yet approved or cleared by the [place] FDA and  has been authorized for detection and/or diagnosis of SARS-CoV-2 by FDA under an Emergency Use Authorization (EUA). This EUA will remain  in effect (meaning this test can be used) for the duration of the COVID-19 declaration under Section 564(b)(1) of the Act, 21 U.S.C.section 360bbb-3(b)(1), unless the authorization is terminated  or revoked sooner.       Influenza A by PCR NEGATIVE NEGATIVE Final   Influenza B by PCR NEGATIVE NEGATIVE Final    Comment: (NOTE) The Xpert Xpress SARS-CoV-2/FLU/RSV plus assay is intended as an aid in the diagnosis of influenza from Nasopharyngeal swab specimens and should not be used as a sole basis for treatment. Nasal washings and aspirates are unacceptable for Xpert Xpress SARS-CoV-2/FLU/RSV testing.  Fact Sheet for Patients: BloggerCourse.com  Fact Sheet for Healthcare Providers: SeriousBroker.it  This test is not yet approved or cleared by the [place] FDA and has been authorized for detection and/or diagnosis of SARS-CoV-2 by FDA under an Emergency Use Authorization (EUA). This EUA will remain in effect (meaning this test can be used) for the duration of the COVID-19 declaration under Section 564(b)(1) of the Act, 21 U.S.C. section 360bbb-3(b)(1), unless the authorization is terminated or revoked.  Performed at [facility], [address] [place], [place] [address]   Culture, blood (Routine X 2) w Reflex to ID Panel     Status: None (Preliminary result)   Collection Time: 06/07/21  3:06 AM   Specimen: BLOOD LEFT FOREARM  Result Value Ref Range Status   Specimen Description BLOOD LEFT FOREARM  Final   Special Requests   Final    BOTTLES DRAWN AEROBIC AND ANAEROBIC Blood Culture adequate volume   Culture   Final    NO GROWTH 1 DAY Performed at [facility], [address], [place], [place] [address]    Report Status PENDING  Incomplete  Urine  Culture     Status: None   Collection Time: 06/07/21  3:13 AM   Specimen: Urine, Clean Catch  Result Value Ref Range Status   Specimen Description   Final    URINE, CLEAN CATCH Performed at [facility], [address] [place], [place] [address]    Special Requests   Final    NONE Performed at [facility], [address], [place], [place] [address]    Culture   Final    NO GROWTH Performed at [facility], [address] [place], [place] [address]    Report Status 06/08/2021 FINAL  Final     Discharge Instructions:   Discharge Instructions  Call MD for:  severe uncontrolled pain   Complete by: As directed    Call MD for:  temperature >100.4   Complete by: As directed    Diet Carb Modified   Complete by: As directed    Discharge instructions   Complete by: As directed    Follow-up with your primary care physician in 1 to 2 weeks for general checkup.  Follow-up with Dr. Allena Katz podiatry in 1 week.  Continue antibiotics as prescribed.  No dressing change until follow-up with Podiatry.  Seek medical attention for worsening symptoms.   Increase activity slowly   Complete by: As directed    No wound care   Complete by: As directed    Continue current dressing until follow-up with podiatry      Allergies as of 06/08/2021       Reactions   Oxycodone Itching        Medication List     STOP taking these medications    cephALEXin 500 MG capsule Commonly known as: KEFLEX   sulfamethoxazole-trimethoprim 400-80 MG tablet Commonly known as: BACTRIM       TAKE these medications    acetaminophen 325 MG tablet Commonly known as: TYLENOL Take 2 tablets (650 mg total) by mouth every 6 (six) hours as needed for up to 14 days for mild pain, fever or headache (or Fever >/= 101).   diclofenac sodium 1 % Gel Commonly known as: VOLTAREN Apply 1 application topically 4 (four) times daily as needed (pain).   doxycycline 100 MG  tablet Commonly known as: VIBRA-TABS Take 1 tablet (100 mg total) by mouth 2 (two) times daily for 14 days.   gabapentin 400 MG capsule Commonly known as: NEURONTIN TAKE 1 CAPSULE BY MOUTH TWICE DAILY. MAY TAKE A THIRD CAPSULE AS NEEDED FOR PAIN What changed:  how much to take how to take this when to take this additional instructions   HYDROcodone-acetaminophen 5-325 MG tablet Commonly known as: NORCO/VICODIN Take 1 tablet by mouth every 6 (six) hours as needed for up to 3 days for moderate pain.   lisinopril 10 MG tablet Commonly known as: ZESTRIL Take 1.5 tablets (15 mg total) by mouth daily.   meloxicam 15 MG tablet Commonly known as: MOBIC Take 15 mg by mouth daily.   multivitamin tablet Take 1 tablet by mouth daily.   ondansetron 4 MG tablet Commonly known as: ZOFRAN Take 1 tablet (4 mg total) by mouth every 8 (eight) hours as needed for up to 5 days for nausea or vomiting.   Vitamin D3 125 MCG (5000 UT) Caps Take 5,000 Units by mouth daily.        Follow-up Information     Olevia Perches P, DO. Go on 06/12/2021.   Specialty: Family Medicine Why: Arrive by 145 for 2pm appointment Contact information: [address] [place] [address] [phone]         Candelaria Stagers, DPM Follow up on 06/11/2021.   Specialty: Podiatry Why: 10:30am appointment Contact information: [address] [place] [address] [phone]                  Time coordinating discharge: 39 minutes  Signed:  Sayvon Arterberry  [facility] 06/08/2021, 5:15 PM

## 2021-06-09 ENCOUNTER — Other Ambulatory Visit: Payer: Self-pay | Admitting: Podiatry

## 2021-06-09 ENCOUNTER — Telehealth: Payer: Self-pay | Admitting: Podiatry

## 2021-06-09 ENCOUNTER — Telehealth: Payer: Self-pay | Admitting: *Deleted

## 2021-06-09 MED ORDER — LOPERAMIDE HCL 2 MG PO CAPS
2.0000 mg | ORAL_CAPSULE | ORAL | 0 refills | Status: DC | PRN
Start: 2021-06-09 — End: 2021-06-23

## 2021-06-09 NOTE — Telephone Encounter (Signed)
Patients wife called stating DrPatel did surgery on her husband. Pts wife states pt came home from the hospital and he has bad diarrhea. Wife . asked if Dr Allena Katz could call something in for the patient. They use [facility] on [address].

## 2021-06-09 NOTE — Telephone Encounter (Signed)
"  Reaching out to you about a complication he's having after his surgery.  Give [place] a call."

## 2021-06-09 NOTE — Telephone Encounter (Signed)
Dr Allena Katz sent over something for diarrhea. Called pt wife let her know. I told patients wife to call if shas any others issue or concerns.

## 2021-06-09 NOTE — Telephone Encounter (Signed)
"  He's a patient of Dr. Nicholes Rough.  We need some advice.  We were in the hospital on 06/07/2021 and was discharged on 06/08/2021.  Please return our call."

## 2021-06-10 ENCOUNTER — Encounter: Payer: Self-pay | Admitting: Family Medicine

## 2021-06-10 ENCOUNTER — Ambulatory Visit: Payer: Self-pay | Admitting: *Deleted

## 2021-06-10 ENCOUNTER — Other Ambulatory Visit: Payer: Self-pay | Admitting: Podiatry

## 2021-06-10 ENCOUNTER — Telehealth: Payer: Self-pay | Admitting: Podiatry

## 2021-06-10 LAB — SURGICAL PATHOLOGY

## 2021-06-10 MED ORDER — SULFAMETHOXAZOLE-TRIMETHOPRIM 800-160 MG PO TABS: 1.0000 | ORAL_TABLET | Freq: Two times a day (BID) | ORAL | 0 refills | Status: DC

## 2021-06-10 NOTE — Telephone Encounter (Signed)
Pt wife called this morning. Patien t is better with diarrhea medication but his wife states she thinks he is having side affects from his antibiotic. The wife Ms Daniel Leon would like to speak to someone about changing his medication. Patient's wife number is [phone].

## 2021-06-10 NOTE — Telephone Encounter (Signed)
Had surgery Sunday-tip of toe removed due to infection. B/P elevated this morning. Last 169/105 P. 82 bpm denies CP/SOB/vision changes. Mild headache earlier took tylenol. Having nausea from antibiotice therapy-call in to surgeon and has appointment tomorrow with him. Has not taken Lisinopril 15mg  this am. Taking now. I will call patient at 10:45a for B/P recheck.  12:05-TC to patient. No answer. Left VM. I will try patient again within 30 minutes.     Reason for Disposition  Systolic BP  >= 160 OR Diastolic >= 100  Answer Assessment - Initial Assessment Questions 1. BLOOD PRESSURE: "What is the blood pressure?" "Did you take at least two measurements 5 minutes apart?"    169/105, P. 82 15 minutes ago, 25 minutes ago 177/103, P 82 2. ONSET: "When did you take your blood pressure?"   This am 3. HOW: "How did you obtain the blood pressure?" (e.g., visiting nurse, automatic home BP monitor)     Home monitor 4. HISTORY: "Do you have a history of high blood pressure?"     Yes 5. MEDICATIONS: "Are you taking any medications for blood pressure?" "Have you missed any doses recently?"     Yes but has not taken it this morning. 6. OTHER SYMPTOMS: "Do you have any symptoms?" (e.g., headache, chest pain, blurred vision, difficulty breathing, weakness)     no 7. PREGNANCY: "Is there any chance you are pregnant?" "When was your last menstrual period?"     na  Protocols used: Blood Pressure - High-A-AH

## 2021-06-10 NOTE — Telephone Encounter (Signed)
Spoke with Mrs. Clovis Riley. Patient's B/P 30 minutes ago 166/98 then 158/93. Denies CP/SOB/Dizziness/Weakness/HA.  While in the hospital he did not receive any Lisinopril Sunday, Monday and yesterday.  Continues to have nausea related to the antibiotic prescribed by surgeon-just talked with office and antibiotic therapy has been changed as of now. Care advice including start drinking water now and rest of day as tolerated. Patient would like to know if she can give the patient a 2nd Lisionpril 15 mg tab this afternoon or tonight? Told her no unless she hears back to do so. She will continue to monitor his sugars and B/P. Has a scheduled follow-up with surgeon tomorrow.  Routing to pcp for any additional advice regarding elevated B/P (please communicate via MyChart today)

## 2021-06-11 ENCOUNTER — Other Ambulatory Visit: Payer: Self-pay

## 2021-06-11 ENCOUNTER — Ambulatory Visit: Payer: Medicare PPO | Admitting: Internal Medicine

## 2021-06-11 ENCOUNTER — Ambulatory Visit: Payer: Medicare Other | Admitting: Podiatry

## 2021-06-11 ENCOUNTER — Encounter: Payer: Self-pay | Admitting: Internal Medicine

## 2021-06-11 ENCOUNTER — Ambulatory Visit: Payer: Self-pay

## 2021-06-11 VITALS — BP 142/78 | HR 75 | Temp 97.6°F | Ht 75.98 in | Wt 249.6 lb

## 2021-06-11 DIAGNOSIS — L0390 Cellulitis, unspecified: Secondary | ICD-10-CM | POA: Diagnosis not present

## 2021-06-11 DIAGNOSIS — R350 Frequency of micturition: Secondary | ICD-10-CM

## 2021-06-11 DIAGNOSIS — Z89421 Acquired absence of other right toe(s): Secondary | ICD-10-CM

## 2021-06-11 DIAGNOSIS — R110 Nausea: Secondary | ICD-10-CM | POA: Diagnosis not present

## 2021-06-11 MED ORDER — ONDANSETRON HCL 8 MG PO TABS: 8.0000 mg | ORAL_TABLET | Freq: Three times a day (TID) | ORAL | 1 refills | Status: DC | PRN

## 2021-06-11 NOTE — Progress Notes (Signed)
BP (!) 142/78   Pulse 75   Temp 97.6 F (36.4 C) (Oral)   Ht 6' 3.98" (1.93 m)   Wt 249 lb 9.6 oz (113.2 kg)   SpO2 96%   BMI 30.40 kg/m    Subjective:    Patient ID: Daniel Leon, male    DOB: Jan 07, 1951, 70 y.o.   MRN: [id number]  Chief Complaint  Patient presents with   Nausea    Patient had a surgery to remove right toe on 06/07/21, he is currently experiencing nausea, acid reflux and does not want to eat, finding it hard to keep food down.     HPI: Daniel Leon is a 70 y.o. male  Pt was in the hospital for a Rt 2nd toe amputation x sec to osteomyelitis.  X-ray of the foot showed osteolysis of the second distal phalanx with overlying soft tissue swelling.  received IV Rocephin and vancomycin  10/16 -- 10/17  Was d/c on oral doxycycline stopped taking this on wenedsday morning pill.  Pt was seen Dr. Allena Katz wound looked perfect.  Was referred here for further mx.   Is on 1 week with 14-day course of doxycycline -  Had diarrhea x 3 days around 4 times a day resolved now. Has been more nausea , no vomiting per pt , has taken zofran once this morning.  06/06/2021 22:12 Creatinine: 2.28 (H)  06/07/2021 05:45  06/08/2021 05:32 Creatinine: 1.39 (H)     Diarrhea  This is a new problem. The current episode started yesterday. Pertinent negatives include no abdominal pain, arthralgias, bloating, chills, coughing, fever, headaches, increased  flatus, myalgias, sweats, URI, vomiting or weight loss.  IMPRESSION: 1. Acute osteomyelitis of the distal phalanx and middle phalanx of the right second toe. 2. Large complex first MTP joint effusion with multiple erosions of the first metatarsal head and base of the great toe proximal phalanx. Findings may reflect an underlying inflammatory or crystalline arthropathy such as gout. Septic arthritis with osteomyelitis are not excluded. 3. Mild bone marrow edema within the proximal diaphysis of the fifth metatarsal  without associated T1 marrow signal abnormality. Findings are favored to represent stress related changes. No definite evidence of osteomyelitis at this time. 4. Moderate degenerative changes throughout the foot.   Chief Complaint  Patient presents with   Nausea    Patient had a surgery to remove right toe on 06/07/21, he is currently experiencing nausea, acid reflux and does not want to eat, finding it hard to keep food down.     Relevant past medical, surgical, family and social history reviewed and updated as indicated. Interim medical history since our last visit reviewed. Allergies and medications reviewed and updated.  Review of Systems  Constitutional:  Negative for chills, fever and weight loss.  Respiratory:  Negative for cough.   Gastrointestinal:  Positive for diarrhea. Negative for abdominal pain, bloating, flatus and vomiting.  Musculoskeletal:  Negative for arthralgias and myalgias.  Neurological:  Negative for headaches.   Per HPI unless specifically indicated above     Objective:    BP (!) 142/78   Pulse 75   Temp 97.6 F (36.4 C) (Oral)   Ht 6' 3.98" (1.93 m)   Wt 249 lb 9.6 oz (113.2 kg)   SpO2 96%   BMI 30.40 kg/m   Wt Readings from Last 3 Encounters:  06/12/21 249 lb (112.9 kg)  06/11/21 249 lb 9.6 oz (113.2 kg)  06/06/21 235 lb (106.6 kg)  Physical Exam Vitals and nursing note reviewed.  Constitutional:      General: He is not in acute distress.    Appearance: Normal appearance. He is not ill-appearing or diaphoretic.  HENT:     Head: Normocephalic and atraumatic.     Right Ear: Tympanic membrane and external ear normal. There is no impacted cerumen.     Left Ear: External ear normal.     Nose: No congestion or rhinorrhea.     Mouth/Throat:     Pharynx: No oropharyngeal exudate or posterior oropharyngeal erythema.  Eyes:     Conjunctiva/sclera: Conjunctivae normal.     Pupils: Pupils are equal, round, and reactive to light.   Cardiovascular:     Rate and Rhythm: Normal rate and regular rhythm.     Heart sounds: No murmur heard.   No friction rub. No gallop.  Pulmonary:     Effort: No respiratory distress.     Breath sounds: No stridor. No wheezing or rhonchi.  Chest:     Chest wall: No tenderness.  Abdominal:     General: Abdomen is flat. Bowel sounds are normal.     Palpations: Abdomen is soft. There is no mass.     Tenderness: There is no abdominal tenderness.  Musculoskeletal:     Cervical back: Normal range of motion and neck supple. No rigidity or tenderness.     Left lower leg: No edema.  Skin:    General: Skin is warm and dry.  Neurological:     Mental Status: He is alert.  Left-sided prosthetic lower extremity noted.  Patient has a right-sided amputation of the second toe which is in a dressing status post seeing his podiatrist this morning.  Results for orders placed or performed in visit on 06/11/21  Microscopic Examination   BLD  Result Value Ref Range   WBC, UA None seen 0 - 5 /hpf   RBC 0-2 0 - 2 /hpf   Epithelial Cells (non renal) None seen 0 - 10 /hpf   Casts Present (A) None seen /lpf   Cast Type Hyaline casts N/A   Crystals Present (A) N/A   Crystal Type Uric Acid N/A   Mucus, UA Present (A) Not Estab.   Bacteria, UA None seen None seen/Few  CBC With Differential/Platelet  Result Value Ref Range   WBC 16.1 (H) 3.4 - 10.8 x10E3/uL   RBC 4.38 4.14 - 5.80 x10E6/uL   Hemoglobin 13.0 13.0 - 17.7 g/dL   Hematocrit 67.1 (L) 24.5 - 51.0 %   MCV 84 79 - 97 fL   MCH 29.7 26.6 - 33.0 pg   MCHC 35.5 31.5 - 35.7 g/dL   RDW 80.9 98.3 - 38.2 %   Platelets 491 (H) 150 - 450 x10E3/uL   Neutrophils 80 Not Estab. %   Lymphs 11 Not Estab. %   MID 9 Not Estab. %   Neutrophils Absolute 12.9 (H) 1.4 - 7.0 x10E3/uL   Lymphocytes Absolute 1.8 0.7 - 3.1 x10E3/uL   MID (Absolute) 1.4 0.1 - 1.6 X10E3/uL  Urinalysis, Routine w reflex microscopic  Result Value Ref Range   Specific Gravity, UA  >1.030 (H) 1.005 - 1.030   pH, UA 5.0 5.0 - 7.5   Color, UA Yellow Yellow   Appearance Ur Clear Clear   Leukocytes,UA Negative Negative   Protein,UA 2+ (A) Negative/Trace   Glucose, UA Negative Negative   Ketones, UA Negative Negative   RBC, UA 2+ (A) Negative   Bilirubin, UA Negative  Negative   Urobilinogen, Ur 0.2 0.2 - 1.0 mg/dL   Nitrite, UA Negative Negative   Microscopic Examination See below:        No current facility-administered medications for this visit.  Current Outpatient Medications:    acetaminophen (TYLENOL) 325 MG tablet, Take 2 tablets (650 mg total) by mouth every 6 (six) hours as needed for up to 14 days for mild pain, fever or headache (or Fever >/= 101)., Disp: 60 tablet, Rfl: 0   Cholecalciferol (VITAMIN D3) 125 MCG (5000 UT) CAPS, Take 5,000 Units by mouth daily., Disp: , Rfl:    diclofenac sodium (VOLTAREN) 1 % GEL, Apply 1 application topically 4 (four) times daily as needed (pain)., Disp: , Rfl:    gabapentin (NEURONTIN) 400 MG capsule, TAKE 1 CAPSULE BY MOUTH TWICE DAILY. MAY TAKE A THIRD CAPSULE AS NEEDED FOR PAIN (Patient taking differently: Take 400-800 mg by mouth 2 (two) times daily. TAKE 1 CAPSULE BY MOUTH in the morning and 2 capsules at night for pain.), Disp: 270 capsule, Rfl: 1   lisinopril (ZESTRIL) 10 MG tablet, Take 1.5 tablets (15 mg total) by mouth daily., Disp: 135 tablet, Rfl: 1   loperamide (IMODIUM) 2 MG capsule, Take 1 capsule (2 mg total) by mouth as needed for diarrhea or loose stools., Disp: 30 capsule, Rfl: 0   meloxicam (MOBIC) 15 MG tablet, Take 15 mg by mouth daily., Disp: , Rfl:    Multiple Vitamin (MULTIVITAMIN) tablet, Take 1 tablet by mouth daily., Disp: , Rfl:    ondansetron (ZOFRAN) 8 MG tablet, Take 1 tablet (8 mg total) by mouth every 8 (eight) hours as needed for nausea or vomiting., Disp: 20 tablet, Rfl: 1   doxycycline (VIBRA-TABS) 100 MG tablet, Take 1 tablet (100 mg total) by mouth 2 (two) times daily for 14 days.  (Patient not taking: No sig reported), Disp: 28 tablet, Rfl: 0   sulfamethoxazole-trimethoprim (BACTRIM DS) 800-160 MG tablet, Take 1 tablet by mouth 2 (two) times daily. (Patient not taking: No sig reported), Disp: 14 tablet, Rfl: 0  Facility-Administered Medications Ordered in Other Visits:    0.9 %  sodium chloride infusion, , Intravenous, Continuous, Lorretta Harp, MD, Last Rate: 100 mL/hr at 06/12/21 0737, New Bag at 06/12/21 0737   heparin injection 5,000 Units, 5,000 Units, Subcutaneous, Q8H, Lorretta Harp, MD   ondansetron [facility]) injection 4 mg, 4 mg, Intravenous, Q8H PRN, Lorretta Harp, MD   pantoprazole (PROTONIX) injection 40 mg, 40 mg, Intravenous, Q12H, Lorretta Harp, MD    Assessment & Plan:  Acute osteomyelitis of right second toe status post amputation discharged on Monday of this week. Was placed on  Doxycycline 100 mg took x 2 (days  and stopped sec to nausea.  Pt was rx bactrim DS - didn't take it yet  He was evaluated by podiatry today who told pt that his postop wound looked normal.  He was noted to have an elevated white cell count of 16 on discharge white count was 6.7.  Patient does not have any fever at this point blood pressure within normal limit vitals stable.  He denies any knee more diarrhea does continue to have nausea.  No vomiting. Patient and wife advised to go to the ER if his symptoms get worse and do not get better.  Patient and wife verbalized understanding of the above restart Bactrim in view of elevated white cell count.  Urinary cultures UA not impressive for a UTI Will send Zofran for nausea to take 8  mg Q8 hourly as needed Problem List Items Addressed This Visit   None Visit Diagnoses     Nausea    -  Primary   Relevant Orders   CBC With Differential/Platelet (Completed)   UA/M w/rflx Culture, Routine (STAT)   Urinalysis, Routine w reflex microscopic   Urine Culture   Cellulitis, unspecified cellulitis site       Relevant Orders   CBC With  Differential/Platelet (Completed)   Urinalysis, Routine w reflex microscopic   Urine Culture   Frequency of urination       Relevant Orders   Urinalysis, Routine w reflex microscopic   Urine Culture        Orders Placed This Encounter  Procedures   Urine Culture   Microscopic Examination   CBC With Differential/Platelet   UA/M w/rflx Culture, Routine (STAT)   Urinalysis, Routine w reflex microscopic   Urinalysis, Routine w reflex microscopic     Meds ordered this encounter  Medications   ondansetron (ZOFRAN) 8 MG tablet    Sig: Take 1 tablet (8 mg total) by mouth every 8 (eight) hours as needed for nausea or vomiting.    Dispense:  20 tablet    Refill:  1     Follow up plan: Return in about 4 days (around 06/15/2021).

## 2021-06-11 NOTE — Telephone Encounter (Signed)
Wife reports pt. Has had nausea/weakness since toe surgery. States podiatrist "thinks it's from his antibiotic - Doxycycline." Reports Zofran is not helping. Wife requests appointment today. Appointment made. Pt. Has hospital follow up appointment tomorrow.   Answer Assessment - Initial Assessment Questions 1. NAME of MEDICATION: "What medicine are you calling about?"     Doxycycline 2. QUESTION: "What is your question?" (e.g., double dose of medicine, side effect)     Side effects 3. PRESCRIBING HCP: "Who prescribed it?" Reason: if prescribed by specialist, call should be referred to that group.     Podiatry 4. SYMPTOMS: "Do you have any symptoms?"     Nausea, weak 5. SEVERITY: If symptoms are present, ask "Are they mild, moderate or severe?"     Moderate 6. PREGNANCY:  "Is there any chance that you are pregnant?" "When was your last menstrual period?"     N/a  Protocols used: Medication Question Call-A-AH

## 2021-06-12 ENCOUNTER — Inpatient Hospital Stay: Payer: Medicare PPO

## 2021-06-12 ENCOUNTER — Encounter: Payer: Self-pay | Admitting: Internal Medicine

## 2021-06-12 ENCOUNTER — Inpatient Hospital Stay
Admission: EM | Admit: 2021-06-12 | Discharge: 2021-06-23 | DRG: 871 | Disposition: A | Discharge status: Home Health | Payer: Medicare PPO | Attending: Internal Medicine | Admitting: Internal Medicine

## 2021-06-12 ENCOUNTER — Emergency Department: Payer: Medicare PPO

## 2021-06-12 ENCOUNTER — Inpatient Hospital Stay: Payer: Medicare PPO | Admitting: Family Medicine

## 2021-06-12 DIAGNOSIS — I77810 Thoracic aortic ectasia: Secondary | ICD-10-CM | POA: Diagnosis present

## 2021-06-12 DIAGNOSIS — I129 Hypertensive chronic kidney disease with stage 1 through stage 4 chronic kidney disease, or unspecified chronic kidney disease: Secondary | ICD-10-CM | POA: Diagnosis not present

## 2021-06-12 DIAGNOSIS — Z79899 Other long term (current) drug therapy: Secondary | ICD-10-CM

## 2021-06-12 DIAGNOSIS — E1122 Type 2 diabetes mellitus with diabetic chronic kidney disease: Secondary | ICD-10-CM | POA: Diagnosis present

## 2021-06-12 DIAGNOSIS — Z885 Allergy status to narcotic agent status: Secondary | ICD-10-CM | POA: Diagnosis not present

## 2021-06-12 DIAGNOSIS — D649 Anemia, unspecified: Secondary | ICD-10-CM | POA: Diagnosis not present

## 2021-06-12 DIAGNOSIS — I10 Essential (primary) hypertension: Secondary | ICD-10-CM | POA: Diagnosis not present

## 2021-06-12 DIAGNOSIS — N390 Urinary tract infection, site not specified: Secondary | ICD-10-CM | POA: Diagnosis present

## 2021-06-12 DIAGNOSIS — L03031 Cellulitis of right toe: Secondary | ICD-10-CM | POA: Diagnosis not present

## 2021-06-12 DIAGNOSIS — Z87891 Personal history of nicotine dependence: Secondary | ICD-10-CM | POA: Diagnosis not present

## 2021-06-12 DIAGNOSIS — I4510 Unspecified right bundle-branch block: Secondary | ICD-10-CM | POA: Diagnosis present

## 2021-06-12 DIAGNOSIS — U071 COVID-19: Secondary | ICD-10-CM | POA: Diagnosis not present

## 2021-06-12 DIAGNOSIS — M154 Erosive (osteo)arthritis: Secondary | ICD-10-CM | POA: Diagnosis not present

## 2021-06-12 DIAGNOSIS — N400 Enlarged prostate without lower urinary tract symptoms: Secondary | ICD-10-CM | POA: Diagnosis present

## 2021-06-12 DIAGNOSIS — R9431 Abnormal electrocardiogram [ECG] [EKG]: Secondary | ICD-10-CM | POA: Diagnosis not present

## 2021-06-12 DIAGNOSIS — I248 Other forms of acute ischemic heart disease: Secondary | ICD-10-CM | POA: Diagnosis not present

## 2021-06-12 DIAGNOSIS — E11610 Type 2 diabetes mellitus with diabetic neuropathic arthropathy: Secondary | ICD-10-CM | POA: Diagnosis not present

## 2021-06-12 DIAGNOSIS — R0689 Other abnormalities of breathing: Secondary | ICD-10-CM | POA: Diagnosis not present

## 2021-06-12 DIAGNOSIS — N3000 Acute cystitis without hematuria: Secondary | ICD-10-CM | POA: Diagnosis not present

## 2021-06-12 DIAGNOSIS — R6520 Severe sepsis without septic shock: Secondary | ICD-10-CM | POA: Diagnosis not present

## 2021-06-12 DIAGNOSIS — E1169 Type 2 diabetes mellitus with other specified complication: Secondary | ICD-10-CM | POA: Diagnosis not present

## 2021-06-12 DIAGNOSIS — Z791 Long term (current) use of non-steroidal anti-inflammatories (NSAID): Secondary | ICD-10-CM

## 2021-06-12 DIAGNOSIS — L03115 Cellulitis of right lower limb: Secondary | ICD-10-CM | POA: Diagnosis present

## 2021-06-12 DIAGNOSIS — M109 Gout, unspecified: Secondary | ICD-10-CM | POA: Diagnosis present

## 2021-06-12 DIAGNOSIS — R000 Tachycardia, unspecified: Secondary | ICD-10-CM | POA: Diagnosis not present

## 2021-06-12 DIAGNOSIS — Z8249 Family history of ischemic heart disease and other diseases of the circulatory system: Secondary | ICD-10-CM | POA: Diagnosis not present

## 2021-06-12 DIAGNOSIS — N1831 Chronic kidney disease, stage 3a: Secondary | ICD-10-CM | POA: Diagnosis not present

## 2021-06-12 DIAGNOSIS — R778 Other specified abnormalities of plasma proteins: Secondary | ICD-10-CM | POA: Diagnosis not present

## 2021-06-12 DIAGNOSIS — D631 Anemia in chronic kidney disease: Secondary | ICD-10-CM | POA: Diagnosis not present

## 2021-06-12 DIAGNOSIS — A419 Sepsis, unspecified organism: Secondary | ICD-10-CM | POA: Diagnosis not present

## 2021-06-12 DIAGNOSIS — R0902 Hypoxemia: Secondary | ICD-10-CM | POA: Diagnosis not present

## 2021-06-12 DIAGNOSIS — R509 Fever, unspecified: Secondary | ICD-10-CM | POA: Diagnosis not present

## 2021-06-12 DIAGNOSIS — Z89512 Acquired absence of left leg below knee: Secondary | ICD-10-CM | POA: Diagnosis not present

## 2021-06-12 DIAGNOSIS — E1142 Type 2 diabetes mellitus with diabetic polyneuropathy: Secondary | ICD-10-CM | POA: Diagnosis not present

## 2021-06-12 DIAGNOSIS — K2980 Duodenitis without bleeding: Secondary | ICD-10-CM | POA: Diagnosis present

## 2021-06-12 DIAGNOSIS — I959 Hypotension, unspecified: Secondary | ICD-10-CM | POA: Diagnosis not present

## 2021-06-12 DIAGNOSIS — N183 Chronic kidney disease, stage 3 unspecified: Secondary | ICD-10-CM | POA: Diagnosis present

## 2021-06-12 DIAGNOSIS — R7989 Other specified abnormal findings of blood chemistry: Secondary | ICD-10-CM | POA: Diagnosis present

## 2021-06-12 DIAGNOSIS — E119 Type 2 diabetes mellitus without complications: Secondary | ICD-10-CM | POA: Diagnosis not present

## 2021-06-12 DIAGNOSIS — R109 Unspecified abdominal pain: Secondary | ICD-10-CM | POA: Diagnosis not present

## 2021-06-12 DIAGNOSIS — M7989 Other specified soft tissue disorders: Secondary | ICD-10-CM | POA: Diagnosis not present

## 2021-06-12 DIAGNOSIS — R531 Weakness: Secondary | ICD-10-CM | POA: Diagnosis not present

## 2021-06-12 LAB — URINALYSIS, ROUTINE W REFLEX MICROSCOPIC
Bilirubin, UA: NEGATIVE
Glucose, UA: NEGATIVE
Ketones, UA: NEGATIVE
Leukocytes,UA: NEGATIVE
Nitrite, UA: NEGATIVE
Specific Gravity, UA: >1.03 — ABNORMAL HIGH (ref 1.005–1.030)
Urobilinogen, Ur: 0.2 mg/dL (ref 0.2–1.0)
pH, UA: 5 (ref 5.0–7.5)

## 2021-06-12 LAB — CBC WITH DIFFERENTIAL/PLATELET
Abs Immature Granulocytes: 0.46 10*3/uL — ABNORMAL HIGH (ref 0.00–0.07)
Basophils Absolute: 0.1 10*3/uL (ref 0.0–0.1)
Basophils Relative: 0 %
Eosinophils Absolute: 0 10*3/uL (ref 0.0–0.5)
Eosinophils Relative: 0 %
HCT: 33.5 % — ABNORMAL LOW (ref 39.0–52.0)
Hematocrit: 36.6 % — ABNORMAL LOW (ref 37.5–51.0)
Hemoglobin: 13 g/dL (ref 13.0–17.7)
Hemoglobin: 11.8 g/dL — ABNORMAL LOW (ref 13.0–17.0)
Immature Granulocytes: 2 %
Lymphocytes Absolute: 1.8 10*3/uL (ref 0.7–3.1)
Lymphocytes Relative: 1 %
Lymphs Abs: 0.2 10*3/uL — ABNORMAL LOW (ref 0.7–4.0)
Lymphs: 11 %
MCH: 29.7 pg (ref 26.6–33.0)
MCH: 30.5 pg (ref 26.0–34.0)
MCHC: 35.5 g/dL (ref 31.5–35.7)
MCHC: 35.2 g/dL (ref 30.0–36.0)
MCV: 84 fL (ref 79–97)
MCV: 86.6 fL (ref 80.0–100.0)
MID (Absolute): 1.4 10*3/uL (ref 0.1–1.6)
MID: 9 %
Monocytes Absolute: 0.6 10*3/uL (ref 0.1–1.0)
Monocytes Relative: 3 %
Neutro Abs: 22.8 10*3/uL — ABNORMAL HIGH (ref 1.7–7.7)
Neutrophils Absolute: 12.9 10*3/uL — ABNORMAL HIGH (ref 1.4–7.0)
Neutrophils Relative %: 94 %
Neutrophils: 80 %
Platelets: 491 10*3/uL — ABNORMAL HIGH (ref 150–450)
Platelets: 327 10*3/uL (ref 150–400)
RBC: 4.38 x10E6/uL (ref 4.14–5.80)
RBC: 3.87 MIL/uL — ABNORMAL LOW (ref 4.22–5.81)
RDW: 14.4 % (ref 11.6–15.4)
RDW: 14 % (ref 11.5–15.5)
WBC: 16.1 10*3/uL — ABNORMAL HIGH (ref 3.4–10.8)
WBC: 24.2 10*3/uL — ABNORMAL HIGH (ref 4.0–10.5)
nRBC: 0 % (ref 0.0–0.2)

## 2021-06-12 LAB — CULTURE, BLOOD (ROUTINE X 2)
Culture: NO GROWTH
Culture: NO GROWTH
Special Requests: ADEQUATE

## 2021-06-12 LAB — COMPREHENSIVE METABOLIC PANEL
ALT: 72 U/L — ABNORMAL HIGH (ref 0–44)
AST: 50 U/L — ABNORMAL HIGH (ref 15–41)
Albumin: 2.9 g/dL — ABNORMAL LOW (ref 3.5–5.0)
Alkaline Phosphatase: 156 U/L — ABNORMAL HIGH (ref 38–126)
Anion gap: 12 (ref 5–15)
BUN: 26 mg/dL — ABNORMAL HIGH (ref 8–23)
CO2: 22 mmol/L (ref 22–32)
Calcium: 8.5 mg/dL — ABNORMAL LOW (ref 8.9–10.3)
Chloride: 96 mmol/L — ABNORMAL LOW (ref 98–111)
Creatinine, Ser: 1.36 mg/dL — ABNORMAL HIGH (ref 0.61–1.24)
GFR, Estimated: 56 mL/min — ABNORMAL LOW (ref >60)
Glucose, Bld: 153 mg/dL — ABNORMAL HIGH (ref 70–99)
Potassium: 3.9 mmol/L (ref 3.5–5.1)
Sodium: 130 mmol/L — ABNORMAL LOW (ref 135–145)
Total Bilirubin: 1.1 mg/dL (ref 0.3–1.2)
Total Protein: 7 g/dL (ref 6.5–8.1)

## 2021-06-12 LAB — URINALYSIS, COMPLETE (UACMP) WITH MICROSCOPIC
Bacteria, UA: NONE SEEN
Bilirubin Urine: NEGATIVE
Glucose, UA: NEGATIVE mg/dL
Ketones, ur: 5 mg/dL — AB
Nitrite: NEGATIVE
Protein, ur: 30 mg/dL — AB
Specific Gravity, Urine: 1.032 — ABNORMAL HIGH (ref 1.005–1.030)
Squamous Epithelial / HPF: NONE SEEN (ref 0–5)
pH: 5 (ref 5.0–8.0)

## 2021-06-12 LAB — MICROSCOPIC EXAMINATION
Bacteria, UA: NONE SEEN
Epithelial Cells (non renal): NONE SEEN /hpf (ref 0–10)
WBC, UA: NONE SEEN /hpf (ref 0–5)

## 2021-06-12 LAB — APTT: aPTT: 31 seconds (ref 24–36)

## 2021-06-12 LAB — TROPONIN I (HIGH SENSITIVITY)
Troponin I (High Sensitivity): 60 ng/L — ABNORMAL HIGH (ref <18)
Troponin I (High Sensitivity): 69 ng/L — ABNORMAL HIGH (ref <18)
Troponin I (High Sensitivity): 69 ng/L — ABNORMAL HIGH (ref <18)
Troponin I (High Sensitivity): 72 ng/L — ABNORMAL HIGH (ref <18)

## 2021-06-12 LAB — PROTIME-INR
INR: 1.3 — ABNORMAL HIGH (ref 0.8–1.2)
Prothrombin Time: 16.6 seconds — ABNORMAL HIGH (ref 11.4–15.2)

## 2021-06-12 LAB — LACTIC ACID, PLASMA
Lactic Acid, Venous: 2.4 mmol/L (ref 0.5–1.9)
Lactic Acid, Venous: 1.8 mmol/L (ref 0.5–1.9)

## 2021-06-12 LAB — PROCALCITONIN: Procalcitonin: 4.63 ng/mL

## 2021-06-12 LAB — C-REACTIVE PROTEIN: CRP: 13.6 mg/dL — ABNORMAL HIGH (ref <1.0)

## 2021-06-12 LAB — RESP PANEL BY RT-PCR (FLU A&B, COVID) ARPGX2
Influenza A by PCR: NEGATIVE
Influenza B by PCR: NEGATIVE
SARS Coronavirus 2 by RT PCR: NEGATIVE

## 2021-06-12 LAB — HEPATITIS PANEL, ACUTE
HCV Ab: NONREACTIVE
Hep A IgM: NONREACTIVE
Hep B C IgM: NONREACTIVE
Hepatitis B Surface Ag: NONREACTIVE

## 2021-06-12 LAB — BRAIN NATRIURETIC PEPTIDE: B Natriuretic Peptide: 126.6 pg/mL — ABNORMAL HIGH (ref 0.0–100.0)

## 2021-06-12 LAB — SEDIMENTATION RATE: Sed Rate: 52 mm/hr — ABNORMAL HIGH (ref 0–20)

## 2021-06-12 MED ORDER — GABAPENTIN 400 MG PO CAPS
800.0000 mg | ORAL_CAPSULE | Freq: Every day | ORAL | Status: DC
  Administered 2021-06-13 – 2021-06-22 (×10): 800 mg via ORAL
  Filled 2021-06-12 (×11): qty 2

## 2021-06-12 MED ORDER — PANTOPRAZOLE SODIUM 40 MG IV SOLR
40.0000 mg | Freq: Two times a day (BID) | INTRAVENOUS | Status: DC
  Administered 2021-06-12 – 2021-06-16 (×9): 40 mg via INTRAVENOUS
  Filled 2021-06-12 (×9): qty 40

## 2021-06-12 MED ORDER — ONDANSETRON HCL 4 MG/2ML IJ SOLN
4.0000 mg | Freq: Once | INTRAMUSCULAR | Status: AC
  Administered 2021-06-12: 4 mg via INTRAVENOUS
  Filled 2021-06-12: qty 2

## 2021-06-12 MED ORDER — ADULT MULTIVITAMIN W/MINERALS CH
1.0000 | ORAL_TABLET | Freq: Every day | ORAL | Status: DC
  Administered 2021-06-13 – 2021-06-23 (×11): 1 via ORAL
  Filled 2021-06-12 (×11): qty 1

## 2021-06-12 MED ORDER — MORPHINE SULFATE (PF) 2 MG/ML IV SOLN
2.0000 mg | INTRAVENOUS | Status: DC | PRN
  Administered 2021-06-12 – 2021-06-21 (×10): 2 mg via INTRAVENOUS
  Filled 2021-06-12 (×10): qty 1

## 2021-06-12 MED ORDER — HEPARIN SODIUM (PORCINE) 5000 UNIT/ML IJ SOLN
5000.0000 [IU] | Freq: Three times a day (TID) | INTRAMUSCULAR | Status: DC
  Administered 2021-06-12 – 2021-06-23 (×32): 5000 [IU] via SUBCUTANEOUS
  Filled 2021-06-12 (×34): qty 1

## 2021-06-12 MED ORDER — SODIUM CHLORIDE 0.9 % IV BOLUS
500.0000 mL | Freq: Once | INTRAVENOUS | Status: AC
  Administered 2021-06-12: 500 mL via INTRAVENOUS

## 2021-06-12 MED ORDER — SODIUM CHLORIDE 0.9 % IV BOLUS
1000.0000 mL | Freq: Once | INTRAVENOUS | Status: AC
  Administered 2021-06-12: 1000 mL via INTRAVENOUS

## 2021-06-12 MED ORDER — ONDANSETRON HCL 4 MG/2ML IJ SOLN
4.0000 mg | Freq: Three times a day (TID) | INTRAMUSCULAR | Status: DC | PRN
  Administered 2021-06-12 – 2021-06-13 (×3): 4 mg via INTRAVENOUS
  Filled 2021-06-12 (×3): qty 2

## 2021-06-12 MED ORDER — DICLOFENAC SODIUM 1 % TD GEL
1.0000 "application " | Freq: Four times a day (QID) | TRANSDERMAL | Status: DC | PRN
  Filled 2021-06-12: qty 50

## 2021-06-12 MED ORDER — PIPERACILLIN-TAZOBACTAM 3.375 G IVPB
3.3750 g | Freq: Three times a day (TID) | INTRAVENOUS | Status: DC
  Administered 2021-06-12 – 2021-06-13 (×3): 3.375 g via INTRAVENOUS
  Filled 2021-06-12 (×3): qty 50

## 2021-06-12 MED ORDER — PANTOPRAZOLE SODIUM 40 MG PO TBEC: 40.0000 mg | DELAYED_RELEASE_TABLET | Freq: Every day | ORAL | Status: DC

## 2021-06-12 MED ORDER — VANCOMYCIN HCL 2000 MG/400ML IV SOLN
2000.0000 mg | Freq: Once | INTRAVENOUS | Status: AC
  Administered 2021-06-12: 2000 mg via INTRAVENOUS
  Filled 2021-06-12: qty 400

## 2021-06-12 MED ORDER — GABAPENTIN 400 MG PO CAPS
400.0000 mg | ORAL_CAPSULE | Freq: Every day | ORAL | Status: DC
  Administered 2021-06-13 – 2021-06-23 (×11): 400 mg via ORAL
  Filled 2021-06-12 (×11): qty 1

## 2021-06-12 MED ORDER — VANCOMYCIN HCL 1500 MG/300ML IV SOLN
1500.0000 mg | INTRAVENOUS | Status: DC
  Administered 2021-06-13: 1500 mg via INTRAVENOUS
  Filled 2021-06-12: qty 300

## 2021-06-12 MED ORDER — VITAMIN D3 25 MCG (1000 UNIT) PO TABS
5000.0000 [IU] | ORAL_TABLET | Freq: Every day | ORAL | Status: DC
  Administered 2021-06-13 – 2021-06-23 (×11): 5000 [IU] via ORAL
  Filled 2021-06-12 (×20): qty 5

## 2021-06-12 MED ORDER — PIPERACILLIN-TAZOBACTAM 3.375 G IVPB 30 MIN
3.3750 g | Freq: Once | INTRAVENOUS | Status: AC
  Administered 2021-06-12: 3.375 g via INTRAVENOUS
  Filled 2021-06-12: qty 50

## 2021-06-12 MED ORDER — LOPERAMIDE HCL 2 MG PO CAPS: 2.0000 mg | ORAL_CAPSULE | ORAL | Status: DC | PRN

## 2021-06-12 MED ORDER — IOHEXOL 350 MG/ML SOLN
100.0000 mL | Freq: Once | INTRAVENOUS | Status: AC | PRN
  Administered 2021-06-12: 100 mL via INTRAVENOUS

## 2021-06-12 MED ORDER — SODIUM CHLORIDE 0.9 % IV SOLN: INTRAVENOUS | Status: DC

## 2021-06-12 NOTE — H&P (Signed)
History and Physical    Daniel Leon Daniel Leon DOB: Jun 06, 1951 DOA: 06/12/2021  Referring MD/NP/PA:   PCP: Valerie Roys, DO   Patient coming from:  The patient is coming from home.  At baseline, pt is independent for most of ADL.        Chief Complaint: weakness and nausea  HPI: Daniel Leon is a 70 y.o. male with medical history significant of hypertension, diet-controlled diabetes, right bundle blockade, peripheral neuropathy, BPH, anemia, s/p of left BKA, CKD-3A, s/p of right second toe amputation due to to osteomyelitis, who presents with weakness and nausea.  Patient has history of right second toe osteomyelitis.  Patient is s/p of right second toe amputation on 06/07/2021.  Patient was initially treated with doxycycline.  He states that he has not been feeling well in the past several days.  Patient has severe generalized weakness, poor appetite, decreased oral intake.  He has nausea and dry heaves, no diarrhea or abdominal pain.  No fever or chills.  Patient has mild dry cough, no chest pain or shortness of breath.  Denies symptoms of UTI. He states that he does not have worsening pain in in the surgical site.  No active drainage noted.  Patient was seen by his podiatrist yesterday, who thought that his wound looks okay.  Patient was started on Bactrim.  Patient was initially hypotensive with blood pressure 82/40, which improved to 109/77 after giving 2 L normal saline bolus in the ED.  ED Course: pt was found to have WBC 24.2, ESR 52, lactic acid 2.4, 1.8, troponin level 60, 69, urinalysis (clear appearance, trace amount of leukocyte, negative bacteria, WBC 6-10), renal function close to baseline, mild abnormal liver function (ALP 156, AST 50, ALT 72, total bilirubin 1.1), temperature normal, heart rate 114, RR 28, oxygen saturation 92-96% on room air.  Chest x-ray negative.  X-ray of right foot showed s/p of second toe amputation without evidence of osteomyelitis.  CT  of abdomen/pelvis that showed mild wall thickening of duodenum, indicating possible duodenitis.  Patient is admitted to [facility] as inpatient.  Dr. Amalia Hailey of podiatry is consulted.  MRI-right foot: 1. Interval amputation of the second toe at the level of the mid diaphysis of the proximal phalanx, without evidence of osteomyelitis involving the remaining portion of the proximal phalanx. 2. Stable appearance of substantial erosive arthropathy at the first MTP joint especially involving the first metatarsal head, with associated marrow edema in the metatarsal head and distal shaft. Complex dorsal effusion. The appearance tends to favor gout arthropathy, but as before, infection is not readily excluded. No substantial change over the last 5 days. 3. Stable stress reaction in the fifth metatarsal, with low-level marrow edema. Small erosion or degenerative subcortical cyst in the head of the fifth metatarsal, stable. 4. Increased flexor digitorum longus tenosynovitis and peritendinitis. 5. Mild dorsal subcutaneous edema along the forefoot, cellulitis is not excluded   Review of Systems:   General: no fevers, chills, no body weight gain, has poor appetite, has fatigue HEENT: no blurry vision, hearing changes or sore throat Respiratory: no dyspnea, has coughing, no wheezing CV: no chest pain, no palpitations GI: has nausea, dry heaves, no abdominal pain, diarrhea, constipation GU: no dysuria, burning on urination, increased urinary frequency, hematuria  Ext: no leg edema. S/p of left BKA. S/p of right second toe amputation Neuro: no unilateral weakness, numbness, or tingling, no vision change or hearing loss Skin: no rash, no skin tear. MSK: No muscle  spasm, no deformity, no limitation of range of movement in spin Heme: No easy bruising.  Travel history: No recent long distant travel.  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    Charcot ankle, left    collapse   Charcot's joint of foot    Diabetes mellitus without complication (HCC)    DIET-CONTROLLED   Early cataracts, bilateral    Essential hypertension    Right bundle branch block (RBBB)    Traumatic arthropathy of ankle and foot    Wears glasses     Past Surgical History:  Procedure Laterality Date   AMPUTATION Left 10/13/2018   Procedure: LEFT BELOW KNEE AMPUTATION;  Surgeon: Newt Minion, MD;  Location: [facility];  Service: Orthopedics;  Laterality: Left;   AMPUTATION TOE Right 06/07/2021   Procedure: AMPUTATION TOE;  Surgeon: Felipa Furnace, DPM;  Location: [facility];  Service: Podiatry;  Laterality: Right;   COLONOSCOPY     ELBOW SURGERY Right 1966   EYE SURGERY  03/2019   cataracts- [place]    EYE SURGERY  02/2019   cataracts - [place]   FOOT ARTHRODESIS, SUBTALAR Right 2019   TONSILLECTOMY     TOTAL ANKLE REPLACEMENT     ULNAR NERVE TRANSPOSITION Right 09/05/2015   Procedure: ULNAR NERVE DECOMPRESSION/TRANSPOSITION;  Surgeon: Christophe Louis, MD;  Location: [facility];  Service: Orthopedics;  Laterality: Right;    Social History:  reports that he quit smoking about 20 years ago. His smoking use included cigarettes. He has a 5.00 pack-year smoking history. He has never used smokeless tobacco. He reports current alcohol use. He reports that he does not use drugs.  Family History:  Family History  Problem Relation Age of Onset   Heart disease Mother    Hypertension Mother    Heart disease Father    Hypertension Father    Hypertension Brother    Prostate cancer Neg Hx    Bladder Cancer Neg Hx    Kidney cancer Neg Hx      Prior to Admission medications   Medication Sig Start Date End Date Taking? Authorizing Provider  acetaminophen (TYLENOL) 325 MG tablet Take 2 tablets (650 mg total) by mouth every 6 (six) hours as needed for up to 14 days for mild pain, fever or headache  (or Fever >/= 101). 06/08/21 06/22/21 Yes Pokhrel, Laxman, MD  Cholecalciferol (VITAMIN D3) 125 MCG (5000 UT) CAPS Take 5,000 Units by mouth daily.   Yes [provider]  diclofenac sodium (VOLTAREN) 1 % GEL Apply 1 application topically 4 (four) times daily as needed (pain).   Yes [provider]  gabapentin (NEURONTIN) 400 MG capsule TAKE 1 CAPSULE BY MOUTH TWICE DAILY. MAY TAKE A THIRD CAPSULE AS NEEDED FOR PAIN Patient taking differently: Take 400-800 mg by mouth 2 (two) times daily. TAKE 1 CAPSULE BY MOUTH in the morning and 2 capsules at night for pain. 01/08/21  Yes Johnson, Megan P, DO  lisinopril (ZESTRIL) 10 MG tablet Take 1.5 tablets (15 mg total) by mouth daily. 01/08/21  Yes Johnson, Megan P, DO  loperamide (IMODIUM) 2 MG capsule Take 1 capsule (2 mg total) by mouth as needed for diarrhea or loose stools. 06/09/21  Yes Felipa Furnace, DPM  meloxicam (MOBIC) 15 MG tablet Take 15 mg by mouth daily. 05/12/21  Yes [provider]  Multiple Vitamin (MULTIVITAMIN)  tablet Take 1 tablet by mouth daily.   Yes [provider]  ondansetron (ZOFRAN) 8 MG tablet Take 1 tablet (8 mg total) by mouth every 8 (eight) hours as needed for nausea or vomiting. 06/11/21  Yes Vigg, Avanti, MD  doxycycline (VIBRA-TABS) 100 MG tablet Take 1 tablet (100 mg total) by mouth 2 (two) times daily for 14 days. Patient not taking: No sig reported 06/08/21 06/22/21  Pokhrel, Corrie Mckusick, MD  sulfamethoxazole-trimethoprim (BACTRIM DS) 800-160 MG tablet Take 1 tablet by mouth 2 (two) times daily. Patient not taking: No sig reported 06/10/21   Felipa Furnace, DPM    Physical Exam: Vitals:   06/12/21 0333 06/12/21 0423 06/12/21 0430 06/12/21 1330  BP: 104/72 113/77 109/77 104/72  Pulse: (!) 105 (!) 104 (!) 105 91  Resp: (!) _0 Temp:    98.4 F (36.9 C)  TempSrc:    Oral  SpO2: 92% 92% 92% 94%  Weight:      Height:       General: Not in acute distress HEENT:       Eyes:  PERRL, EOMI, no scleral icterus.       ENT: No discharge from the ears and nose, no pharynx injection, no tonsillar enlargement.        Neck: No JVD, no bruit, no mass felt. Heme: No neck lymph node enlargement. Cardiac: S1/S2, RRR, No murmurs, No gallops or rubs. Respiratory: No rales, wheezing, rhonchi or rubs. GI: Soft, nondistended, nontender, no rebound pain, no organomegaly, BS present. GU: No hematuria Ext: No pitting leg edema bilaterally. S/p of left BKA. S/p of right second toe amputation, no active drainage.       Musculoskeletal: No joint deformities, No joint redness or warmth, no limitation of ROM in spin. Skin: No rashes.  Neuro: Alert, oriented X3, cranial nerves II-XII grossly intact, moves all extremities normally.  Psych: Patient is not psychotic, no suicidal or hemocidal ideation.  Labs on Admission: I have personally reviewed following labs and imaging studies  CBC: Recent Labs  Lab 06/06/21 2212 06/11/21 1507 06/12/21 0120  WBC 6.7 16.1* 24.2*  NEUTROABS  --  12.9* 22.8*  HGB 11.4* 13.0 11.8*  HCT 33.1* 36.6* 33.5*  MCV 88.7 84 86.6  PLT 203 491* 810   Basic Metabolic Panel: Recent Labs  Lab 06/06/21 2212 06/08/21 0532 06/12/21 0120  NA 133* 131* 130*  K 4.8 4.4 3.9  CL 103 104 96*  CO2 20* 22 22  GLUCOSE 196* 141* 153*  BUN 38* 26* 26*  CREATININE 2.28* 1.39* 1.36*  CALCIUM 8.7* 8.4* 8.5*   GFR: Estimated Creatinine Clearance: 68.5 mL/min (A) (by C-G formula based on SCr of 1.36 mg/dL (H)). Liver Function Tests: Recent Labs  Lab 06/12/21 0120  AST 50*  ALT 72*  ALKPHOS 156*  BILITOT 1.1  PROT 7.0  ALBUMIN 2.9*   No results for input(s): LIPASE, AMYLASE in the last 168 hours. No results for input(s): AMMONIA in the last 168 hours. Coagulation Profile: No results for input(s): INR, PROTIME in the last 168 hours. Cardiac Enzymes: No results for input(s): CKTOTAL, CKMB, CKMBINDEX, TROPONINI in the last 168 hours. BNP (last 3  results) No results for input(s): PROBNP in the last 8760 hours. HbA1C: No results for input(s): HGBA1C in the last 72 hours. CBG: Recent Labs  Lab 06/07/21 0835 06/07/21 1023 06/07/21 1635 06/08/21 0853  GLUCAP 122* 138* 124* 118*   Lipid Profile: No results for input(s): CHOL, HDL,  LDLCALC, TRIG, CHOLHDL, LDLDIRECT in the last 72 hours. Thyroid Function Tests: No results for input(s): TSH, T4TOTAL, FREET4, T3FREE, THYROIDAB in the last 72 hours. Anemia Panel: No results for input(s): VITAMINB12, FOLATE, FERRITIN, TIBC, IRON, RETICCTPCT in the last 72 hours. Urine analysis:    Component Value Date/Time   COLORURINE YELLOW (A) 06/12/2021 0338   APPEARANCEUR CLEAR (A) 06/12/2021 0338   APPEARANCEUR Clear 06/11/2021 1507   LABSPEC 1.032 (H) 06/12/2021 0338   PHURINE 5.0 06/12/2021 0338   GLUCOSEU NEGATIVE 06/12/2021 0338   HGBUR SMALL (A) 06/12/2021 0338   BILIRUBINUR NEGATIVE 06/12/2021 0338   BILIRUBINUR Negative 06/11/2021 1507   KETONESUR 5 (A) 06/12/2021 0338   PROTEINUR 30 (A) 06/12/2021 0338   NITRITE NEGATIVE 06/12/2021 0338   LEUKOCYTESUR TRACE (A) 06/12/2021 0338   Sepsis Labs: _0 (procalcitonin:4,lacticidven:4) ) Recent Results (from the past 240 hour(s))  Culture, blood (Routine X 2) w Reflex to ID Panel     Status: None   Collection Time: 06/07/21  3:01 AM   Specimen: Right Antecubital; Blood  Result Value Ref Range Status   Specimen Description RIGHT ANTECUBITAL  Final   Special Requests   Final    BOTTLES DRAWN AEROBIC AND ANAEROBIC Blood Culture results may not be optimal due to an excessive volume of blood received in culture bottles   Culture   Final    NO GROWTH 5 DAYS Performed at [facility], [address] [place], [place] [address]    Report Status 06/12/2021 FINAL  Final  Resp Panel by RT-PCR (Flu A&B, Covid) Nasopharyngeal Swab     Status: None   Collection Time: 06/07/21  3:02 AM   Specimen: Nasopharyngeal Swab;  Nasopharyngeal(NP) swabs in vial transport medium  Result Value Ref Range Status   SARS Coronavirus 2 by RT PCR NEGATIVE NEGATIVE Final    Comment: (NOTE) SARS-CoV-2 target nucleic acids are NOT DETECTED.  The SARS-CoV-2 RNA is generally detectable in upper respiratory specimens during the acute phase of infection. The lowest concentration of SARS-CoV-2 viral copies this assay can detect is 138 copies/mL. A negative result does not preclude SARS-Cov-2 infection and should not be used as the sole basis for treatment or other patient management decisions. A negative result may occur with  improper specimen collection/handling, submission of specimen other than nasopharyngeal swab, presence of viral mutation(s) within the areas targeted by this assay, and inadequate number of viral copies(<138 copies/mL). A negative result must be combined with clinical observations, patient history, and epidemiological information. The expected result is Negative.  Fact Sheet for Patients:  EntrepreneurPulse.com.au  Fact Sheet for Healthcare Providers:  IncredibleEmployment.be  This test is no t yet approved or cleared by the [place] FDA and  has been authorized for detection and/or diagnosis of SARS-CoV-2 by FDA under an Emergency Use Authorization (EUA). This EUA will remain  in effect (meaning this test can be used) for the duration of the COVID-19 declaration under Section 564(b)(1) of the Act, 21 U.S.C.section 360bbb-3(b)(1), unless the authorization is terminated  or revoked sooner.       Influenza A by PCR NEGATIVE NEGATIVE Final   Influenza B by PCR NEGATIVE NEGATIVE Final    Comment: (NOTE) The Xpert Xpress SARS-CoV-2/FLU/RSV plus assay is intended as an aid in the diagnosis of influenza from Nasopharyngeal swab specimens and should not be used as a sole basis for treatment. Nasal washings and aspirates are unacceptable for Xpert Xpress  SARS-CoV-2/FLU/RSV testing.  Fact Sheet for Patients: EntrepreneurPulse.com.au  Fact  Sheet for Healthcare Providers: IncredibleEmployment.be  This test is not yet approved or cleared by the [place] and has been authorized for detection and/or diagnosis of SARS-CoV-2 by FDA under an Emergency Use Authorization (EUA). This EUA will remain in effect (meaning this test can be used) for the duration of the COVID-19 declaration under Section 564(b)(1) of the Act, 21 U.S.C. section 360bbb-3(b)(1), unless the authorization is terminated or revoked.  Performed at [facility], [address] [place], [place] [address]   Culture, blood (Routine X 2) w Reflex to ID Panel     Status: None   Collection Time: 06/07/21  3:06 AM   Specimen: BLOOD LEFT FOREARM  Result Value Ref Range Status   Specimen Description BLOOD LEFT FOREARM  Final   Special Requests   Final    BOTTLES DRAWN AEROBIC AND ANAEROBIC Blood Culture adequate volume   Culture   Final    NO GROWTH 5 DAYS Performed at [facility], [address], [place], [place] [address]    Report Status 06/12/2021 FINAL  Final  Urine Culture     Status: None   Collection Time: 06/07/21  3:13 AM   Specimen: Urine, Clean Catch  Result Value Ref Range Status   Specimen Description   Final    URINE, CLEAN CATCH Performed at [facility], [address], [place], [place] [address]    Special Requests   Final    NONE Performed at [facility], [address], [place], [place] [address]    Culture   Final    NO GROWTH Performed at [facility], [place] [address] [place], [place] [address]    Report Status 06/08/2021 FINAL  Final  Microscopic Examination     Status: Abnormal   Collection Time: 06/11/21  3:07 PM   BLD  Result Value Ref Range Status   WBC, UA None seen 0 - 5 /hpf Final   RBC 0-2 0 - 2 /hpf Final   Epithelial Cells (non renal) None  seen 0 - 10 /hpf Final   Casts Present (A) None seen /lpf Final   Cast Type Hyaline casts N/A Final   Crystals Present (A) N/A Final   Crystal Type Uric Acid N/A Final   Mucus, UA Present (A) Not Estab. Final   Bacteria, UA None seen None seen/Few Final  Blood culture (routine x 2)     Status: None (Preliminary result)   Collection Time: 06/12/21  1:27 AM   Specimen: BLOOD  Result Value Ref Range Status   Specimen Description BLOOD LEFT FA  Final   Special Requests   Final    BOTTLES DRAWN AEROBIC AND ANAEROBIC Blood Culture adequate volume   Culture   Final    NO GROWTH < 12 HOURS Performed at [facility], [address] [place], [place] [address]    Report Status PENDING  Incomplete  Blood culture (routine x 2)     Status: None (Preliminary result)   Collection Time: 06/12/21  1:27 AM   Specimen: BLOOD  Result Value Ref Range Status   Specimen Description BLOOD LEFT HAND  Final   Special Requests   Final    BOTTLES DRAWN AEROBIC AND ANAEROBIC Blood Culture adequate volume   Culture   Final    NO GROWTH < 12 HOURS Performed at [facility], [address] [place], [place] [address]    Report Status PENDING  Incomplete  Resp Panel by  RT-PCR (Flu A&B, Covid) Nasopharyngeal Swab     Status: None   Collection Time: 06/12/21  1:29 AM   Specimen: Nasopharyngeal Swab; Nasopharyngeal(NP) swabs in vial transport medium  Result Value Ref Range Status   SARS Coronavirus 2 by RT PCR NEGATIVE NEGATIVE Final    Comment: (NOTE) SARS-CoV-2 target nucleic acids are NOT DETECTED.  The SARS-CoV-2 RNA is generally detectable in upper respiratory specimens during the acute phase of infection. The lowest concentration of SARS-CoV-2 viral copies this assay can detect is 138 copies/mL. A negative result does not preclude SARS-Cov-2 infection and should not be used as the sole basis for treatment or other patient management decisions. A negative result may occur with   improper specimen collection/handling, submission of specimen other than nasopharyngeal swab, presence of viral mutation(s) within the areas targeted by this assay, and inadequate number of viral copies(<138 copies/mL). A negative result must be combined with clinical observations, patient history, and epidemiological information. The expected result is Negative.  Fact Sheet for Patients:  EntrepreneurPulse.com.au  Fact Sheet for Healthcare Providers:  IncredibleEmployment.be  This test is no t yet approved or cleared by the [place] FDA and  has been authorized for detection and/or diagnosis of SARS-CoV-2 by FDA under an Emergency Use Authorization (EUA). This EUA will remain  in effect (meaning this test can be used) for the duration of the COVID-19 declaration under Section 564(b)(1) of the Act, 21 U.S.C.section 360bbb-3(b)(1), unless the authorization is terminated  or revoked sooner.       Influenza A by PCR NEGATIVE NEGATIVE Final   Influenza B by PCR NEGATIVE NEGATIVE Final    Comment: (NOTE) The Xpert Xpress SARS-CoV-2/FLU/RSV plus assay is intended as an aid in the diagnosis of influenza from Nasopharyngeal swab specimens and should not be used as a sole basis for treatment. Nasal washings and aspirates are unacceptable for Xpert Xpress SARS-CoV-2/FLU/RSV testing.  Fact Sheet for Patients: EntrepreneurPulse.com.au  Fact Sheet for Healthcare Providers: IncredibleEmployment.be  This test is not yet approved or cleared by the [place] FDA and has been authorized for detection and/or diagnosis of SARS-CoV-2 by FDA under an Emergency Use Authorization (EUA). This EUA will remain in effect (meaning this test can be used) for the duration of the COVID-19 declaration under Section 564(b)(1) of the Act, 21 U.S.C. section 360bbb-3(b)(1), unless the authorization is terminated  or revoked.  Performed at [facility], [address] [place], [place] [address]      Radiological Exams on Admission: CT ABDOMEN PELVIS W CONTRAST  Result Date: 06/12/2021 CLINICAL DATA:  Abdominal pain, fever EXAM: CT ABDOMEN AND PELVIS WITH CONTRAST TECHNIQUE: Multidetector CT imaging of the abdomen and pelvis was performed using the standard protocol following bolus administration of intravenous contrast. CONTRAST:  161m OMNIPAQUE IOHEXOL 350 MG/ML SOLN COMPARISON:  None. FINDINGS: Lower chest: Mild bibasilar atelectasis. Hepatobiliary: 2.8 cm cyst in segment 6. Gallbladder is unremarkable. No intrahepatic or extrahepatic ductal dilatation. Pancreas: Within normal limits. Spleen: Within normal limits. Adrenals/Urinary Tract: Adrenal glands are within normal limits. Kidneys are within normal limits.  No hydronephrosis. Bladder is within normal limits. Stomach/Bowel: Stomach is within normal limits. Mild wall thickening involving the proximal duodenum with suspected small duodenal diverticulum (coronal image 46). No evidence of bowel obstruction. Normal appendix (series 2/image 71). No colonic wall thickening or inflammatory changes. Vascular/Lymphatic: No evidence of abdominal aortic aneurysm. Atherosclerotic calcifications of the abdominal aorta and branch vessels. No suspicious abdominopelvic lymphadenopathy. Reproductive: Prostate is unremarkable. Other: No abdominopelvic  ascites. Musculoskeletal: Degenerative changes of the visualized thoracolumbar spine. IMPRESSION: Mild wall thickening involving the proximal duodenum, raising the possibility of duodenitis. Adjacent small duodenal diverticulum. No evidence of bowel obstruction.  Normal appendix. Electronically Signed   By: Julian Hy M.D.   On: 06/12/2021 03:27   MR FOOT RIGHT WO CONTRAST  Result Date: 06/12/2021 CLINICAL DATA:  Swelling of the foot, diabetes. Recent (06/07/2021) amputation of the right second toe at the  diaphyseal level of the proximal phalanx. EXAM: MRI OF THE RIGHT FOREFOOT WITHOUT CONTRAST TECHNIQUE: Multiplanar, multisequence MR imaging of the right forefoot was performed. No intravenous contrast was administered. COMPARISON:  06/12/2021 radiographs and MRI from 06/07/2021 FINDINGS: Despite efforts by the technologist and patient, prominent motion artifact is present on today's exam and could not be eliminated. This reduces exam sensitivity and specificity. Bones/Joint/Cartilage Amputation of the second toe at the level of the mid diaphysis of the proximal phalanx. Trace fluid signal intensity just distal to the bony margin on image 19 series 10 but no findings of active osteomyelitis involving the remainder of the proximal phalanx. Stable arthropathy between the middle cuneiform and the base of the second metatarsal, with associated marrow edema and erosion or subcortical cyst along the base of the second metatarsal on image 9 series 7. We again demonstrate substantial erosions along the medial, lateral, and distal articular margins of the first metatarsal head best depicted on the T1 weighted images such as images 10 through 13 of series 7. There is associated marrow edema in the left first metatarsal head and distal shaft. Degenerative findings but no substantial degree of edema in the proximal phalanx of the great toe. Effusion of the first MTP joint is again noted particularly dorsally where there a septated and somewhat heterogeneous effusion on image 26 series 10 similar to prior. Mild sclerosis of the first digit medial sesamoid. Overall these findings are not changed from prior, with the erosions along the first metatarsal head potentially from gout/crystal arthropathy or septic joint. Small erosion or degenerative subcortical cyst in the head of the fifth metatarsal, image 12 series 7, similar to prior. There is low-level marrow edema in the proximal diaphysis of the fifth metatarsal which is  unchanged and probably from stress reaction. Additional mild degenerative findings noted along the midfoot and Chopart joint. Ligaments The Lisfranc ligament appears intact. Muscles and Tendons Flexor digitorum longus tenosynovitis and peritendinitis distally, mildly increased from the 06/07/2021 exam. Regional muscular atrophy with low-level accentuated T2 signal along the regional musculature which is probably neuropathic. Soft tissues Mild dorsal subcutaneous edema along the forefoot. IMPRESSION: 1. Interval amputation of the second toe at the level of the mid diaphysis of the proximal phalanx, without evidence of osteomyelitis involving the remaining portion of the proximal phalanx. 2. Stable appearance of substantial erosive arthropathy at the first MTP joint especially involving the first metatarsal head, with associated marrow edema in the metatarsal head and distal shaft. Complex dorsal effusion. The appearance tends to favor gout arthropathy, but as before, infection is not readily excluded. No substantial change over the last 5 days. 3. Stable stress reaction in the fifth metatarsal, with low-level marrow edema. Small erosion or degenerative subcortical cyst in the head of the fifth metatarsal, stable. 4. Increased flexor digitorum longus tenosynovitis and peritendinitis. 5. Mild dorsal subcutaneous edema along the forefoot, cellulitis is not excluded. Electronically Signed   By: Van Clines M.D.   On: 06/12/2021 12:13   DG Chest Portable 1 View  Result Date:  06/12/2021 CLINICAL DATA:  Weakness EXAM: PORTABLE CHEST 1 VIEW COMPARISON:  None. FINDINGS: Lungs are clear.  No pleural effusion or pneumothorax. The heart is top-normal in size. IMPRESSION: No evidence of acute cardiopulmonary disease. Electronically Signed   By: Julian Hy M.D.   On: 06/12/2021 01:55   DG Foot Complete Right  Result Date: 06/12/2021 CLINICAL DATA:  Right toe amputation on 10/16 for osteo, weakness EXAM:  RIGHT FOOT COMPLETE - 3+ VIEW COMPARISON:  None. FINDINGS: No fracture or dislocation is seen. Status post amputation of the 2nd digit at the level of the base of the proximal phalanx. Mild soft tissue swelling. The joint spaces are preserved. IMPRESSION: Status post 2nd digit amputation. No acute findings. Electronically Signed   By: Julian Hy M.D.   On: 06/12/2021 01:55     EKG: I have personally reviewed.  Sinus rhythm, QTC 480, RAD, right bundle blockade, early R wave progression   Aassessment/Plan Principal Problem:   Severe sepsis (HCC) Active Problems:   Essential hypertension   Normocytic anemia   UTI (urinary tract infection)   Cellulitis of second toe of right foot   CKD (chronic kidney disease), stage IIIa   Duodenitis   Elevated troponin   Abnormal LFTs   Severe sepsis: Patient meets criteria for severe sepsis with WBC 24.2, tachycardia with heart rate 114, tachypnea with RR 28.  Lactic acid is elevated 2.4 --> 1.9.  Initially hypotensive which responded to IV fluid resuscitation.  Source of infection is not completely clear.  Patient has history of R second toe osteomyelitis, he is s/p of second toe amputation.  MRI of right foot is negative for evidence of osteomyelitis, but cannot rule out cellulitis of right foot.  Patient also has possible UTI.  CT of abdomen/pelvis showed possible duodenitis.  - Admitted to MedSurg bed as inpatient - Empiric antimicrobial treatment with vancomycin and zosyn - PRN Zofran for nausea, morphine for pain - Blood cultures x 2  - ESR --> 52 - CRP - will get Procalcitonin and trend lactic acid - IVF: 2.5 L of NS bolus in ED, followed by 100 cc/h  Cellulitis of second toe of right foot: s/p of right second toe amputation.  Consulted Dr. Amalia Hailey of podiatry -On IV antibiotics as above -Wound care consult  UTI (urinary tract infection); -on broad antibiotics as above -Follow-up urine culture  Essential hypertension -Hold all blood  pressure medications due to hypotension  Normocytic anemia: Hemoglobin 11.8, stable -Follow-up with CBC  CKD (chronic kidney disease), stage IIIa:: Renal function close to baseline.  Recent baseline creatinine 1.15-1.39, his creatinine is 1.36, BUN 26. -Follow-up by BMP  Possible duodenitis: As shown by CT scan of abdomen/pelvis -Protonix 40 mg twice daily by IV -Zofran for nausea  Abnormal liver function: Patient has some mild abnormal liver function, likely due to sepsis. -Check hepatitis panel -Avoid using Tylenol  Elevated troponin: Troponin level 60, 69, no chest pain, likely due to demand ischemia. -Trend troponin -Will not start aspirin due to duodenitis -Check A1c, FLP       DVT ppx: SQ Heparin   Code Status: Full code Family Communication:   Yes, patient's   wife at bed side Disposition Plan:  Anticipate discharge back to previous environment Consults called:   Dr. Amalia Hailey of podiatry is consulted. Admission status and Level of care: Med-Surg:   as inpt        Status is: Inpatient  Remains inpatient appropriate because: Patient has multiple comorbidities, now  presents with severe sepsis with unclear source of infection.  Patient also has abnormal liver function, possible duodenitis and elevated troponin.  His presentation is highly complicated.  He was initially hypotensive. Patient is at high risk of deteriorating.  Will need to be treated in hospital for at least 2 days          Date of Service 06/12/2021    [facility]   If 7PM-7AM, please contact night-coverage [email or web address] 06/12/2021, 2:41 PM

## 2021-06-12 NOTE — ED Triage Notes (Addendum)
Pt to ED via EMS from home c/o right toe from amputation on 06/07/21.  Now complaining worsening weakness and vomiting.  Seen for follow up today by surgeon stating wound looks good but started on sulfa abx d/t WBC 16.5.  EMS vitals 100.6, 125/88 BP, 120 HR, 40 RR, 95% 4L [place] and not normally on oxygen.  Hx of L leg amputation.  Pt presents A&Ox4, chest rise even and unlabored at this time.

## 2021-06-12 NOTE — Consult Note (Signed)
Pharmacy Antibiotic Note  Daniel Leon is a 70 y.o. male admitted on 06/12/2021 with  severe sepsis of unknown origin with concern for possible osteomyelitis and/or UTI   s/p recent amputation of second toe for osteomyelitis on 06/07/21. He was discharged on doxycycline but stopped taking the medication because he thought it was making him nauseous. He was then switched to Bactrim on 10/20 and although the Podiatry had stated the surgical site was healing well, he became progressively weak and unable to get out of bed. Pharmacy has been consulted for Vancomycin and Zosyn dosing.  Plan: Zosyn 3.375g IV q8h (4 hour infusion).  Given Vancomycin 2g IV x 1 loading dose Vancomycin 1500 mg IV Q 24 hrs. Goal AUC 400-550. Expected AUC: 481.7 Expected Css: 10.5 SCr used: 1.36   Height: 6\' 4"  (193 cm) Weight: 112.9 kg (249 lb) IBW/kg (Calculated) : 86.8  Temp (24hrs), Avg:97.9 F (36.6 C), Min:97.6 F (36.4 C), Max:98.2 F (36.8 C)  Recent Labs  Lab 06/06/21 2212 06/07/21 0545 06/08/21 0532 06/11/21 1507 06/12/21 0120 06/12/21 0338  WBC 6.7  --   --  16.1* 24.2*  --   CREATININE 2.28*  --  1.39*  --  1.36*  --   LATICACIDVEN  --  0.7  --   --  2.4* 1.8    Estimated Creatinine Clearance: 68.5 mL/min (A) (by C-G formula based on SCr of 1.36 mg/dL (H)).    Allergies  Allergen Reactions   Oxycodone Itching    Antimicrobials this admission: Vancomycin 10/21 >>  Zosyn 10/21 >>    Microbiology results: 10/21 BCx: pending 10/21 UCx: pending    Thank you for allowing pharmacy to be a part of this patient's care.  Daniel Leon A Daniel Leon 06/12/2021 9:17 AM

## 2021-06-12 NOTE — ED Notes (Signed)
Informed RN bed assigned 

## 2021-06-12 NOTE — ED Notes (Signed)
Pt to MRI at this time. Wife remains in room

## 2021-06-12 NOTE — ED Notes (Signed)
Nurse alert on 2nd floor that we are enroute.

## 2021-06-12 NOTE — ED Provider Notes (Signed)
[facility]  ____________________________________________   Event Date/Time   First MD Initiated Contact with Patient 06/12/21 0113     (approximate)  I have reviewed the triage vital signs and the nursing notes.   HISTORY  Chief Complaint Wound Infection    HPI QUINNTEN CALVIN is a 70 y.o. male with past medical history of hypertension, diet-controlled diabetes, recent toe amputation secondary to osteomyelitis who presents with fatigue and weakness.  Had his second toe amputated on 10/16.  Was discharged on 10/17.  He was discharged with doxycycline.  Since the surgery he has had significant nausea and decreased p.o. intake.  He saw his podiatrist today and was told that he could stop taking antibiotics because his wound looked good.  However he apparently was also prescribed Bactrim which he took 1 dose of.  He endorses nausea and significant fatigue.  For his wife he has had progressively difficult time getting out of bed and is now barely able to stand.  Patient denies shortness of breath cough or chest pain.  He denies abdominal pain diarrhea constipation.  No fevers at home.         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    Charcot ankle, left    collapse   Charcot's joint of foot    Diabetes mellitus without complication (HCC)    DIET-CONTROLLED   Early cataracts, bilateral    Essential hypertension    Right bundle branch block (RBBB)    Traumatic arthropathy of ankle and foot    Wears glasses     Patient Active Problem List   Diagnosis Date Noted   Cellulitis of second toe of right foot 06/07/2021   AKI (acute kidney injury) ([place]) 06/07/2021   Hematuria 06/07/2021   Senile purpura ([place]) 12/03/2019   History of below-knee amputation of left lower extremity ([place]) 10/13/2018   Charcot foot due to diabetes mellitus ([place]) 01/26/2018   Neuropathy 12/29/2016   BPH (benign prostatic  hyperplasia) 12/29/2016   Advanced care planning/counseling discussion 12/29/2016   DM type 2 with diabetic peripheral neuropathy ([place]) 06/30/2016   Anemia 07/28/2015   Essential hypertension     Past Surgical History:  Procedure Laterality Date   AMPUTATION Left 10/13/2018   Procedure: LEFT BELOW KNEE AMPUTATION;  Surgeon: Newt Minion, MD;  Location: [place];  Service: Orthopedics;  Laterality: Left;   AMPUTATION TOE Right 06/07/2021   Procedure: AMPUTATION TOE;  Surgeon: Felipa Furnace, DPM;  Location: [facility];  Service: Podiatry;  Laterality: Right;   COLONOSCOPY     ELBOW SURGERY Right 1966   EYE SURGERY  03/2019   cataracts- [place]    EYE SURGERY  02/2019   cataracts - [place]   FOOT ARTHRODESIS, SUBTALAR Right 2019   TONSILLECTOMY     TOTAL ANKLE REPLACEMENT     ULNAR NERVE TRANSPOSITION Right 09/05/2015   Procedure: ULNAR NERVE DECOMPRESSION/TRANSPOSITION;  Surgeon: Christophe Louis, MD;  Location: [facility];  Service: Orthopedics;  Laterality: Right;    Prior to Admission medications   Medication Sig Start Date End Date Taking? Authorizing Provider  acetaminophen (TYLENOL) 325 MG tablet Take 2 tablets (650 mg total) by mouth every 6 (six) hours as needed for up to 14 days for mild pain, fever or headache (or Fever >/= 101). 06/08/21 06/22/21  Pokhrel, Corrie Mckusick, MD  Cholecalciferol (VITAMIN D3) 125 MCG (5000 UT) CAPS Take 5,000 Units by mouth daily.  [provider]  diclofenac sodium (VOLTAREN) 1 % GEL Apply 1 application topically 4 (four) times daily as needed (pain).    [provider]  doxycycline (VIBRA-TABS) 100 MG tablet Take 1 tablet (100 mg total) by mouth 2 (two) times daily for 14 days. Patient not taking: Reported on 06/11/2021 06/08/21 06/22/21  Pokhrel, Corrie Mckusick, MD  gabapentin (NEURONTIN) 400 MG capsule TAKE 1 CAPSULE BY MOUTH TWICE DAILY. MAY TAKE A THIRD CAPSULE AS NEEDED FOR PAIN Patient taking differently: Take 400-800 mg by mouth 2  (two) times daily. TAKE 1 CAPSULE BY MOUTH in the morning and 2 capsules at night for pain. 01/08/21   Johnson, Megan P, DO  lisinopril (ZESTRIL) 10 MG tablet Take 1.5 tablets (15 mg total) by mouth daily. 01/08/21   Park Liter P, DO  loperamide (IMODIUM) 2 MG capsule Take 1 capsule (2 mg total) by mouth as needed for diarrhea or loose stools. 06/09/21   Felipa Furnace, DPM  meloxicam (MOBIC) 15 MG tablet Take 15 mg by mouth daily. 05/12/21   [provider]  Multiple Vitamin (MULTIVITAMIN) tablet Take 1 tablet by mouth daily.    [provider]  ondansetron (ZOFRAN) 8 MG tablet Take 1 tablet (8 mg total) by mouth every 8 (eight) hours as needed for nausea or vomiting. 06/11/21   Vigg, Avanti, MD  sulfamethoxazole-trimethoprim (BACTRIM DS) 800-160 MG tablet Take 1 tablet by mouth 2 (two) times daily. Patient not taking: Reported on 06/11/2021 06/10/21   Felipa Furnace, DPM    Allergies Oxycodone  Family History  Problem Relation Age of Onset   Heart disease Mother    Hypertension Mother    Heart disease Father    Hypertension Father    Hypertension Brother    Prostate cancer Neg Hx    Bladder Cancer Neg Hx    Kidney cancer Neg Hx     Social History Social History   Tobacco Use   Smoking status: Former    Packs/day: 0.50    Years: 10.00    Pack years: 5.00    Types: Cigarettes    Quit date: 07/27/2000    Years since quitting: 20.8   Smokeless tobacco: Never  Vaping Use   Vaping Use: Never used  Substance Use Topics   Alcohol use: Yes    Comment: very little   Drug use: No    Review of Systems   Review of Systems  Constitutional:  Positive for activity change, appetite change and fatigue. Negative for chills and fever.  Respiratory:  Negative for cough and shortness of breath.   Cardiovascular:  Negative for chest pain.  Gastrointestinal:  Negative for abdominal pain, diarrhea, nausea and vomiting.  Neurological:  Positive for weakness. Negative for  headaches.  All other systems reviewed and are negative.  Physical Exam Updated Vital Signs BP 113/77   Pulse (!) 104   Temp 98.2 F (36.8 C) (Oral)   Resp 18   Ht 6' 4" (1.93 m)   Wt 112.9 kg   SpO2 92%   BMI 30.31 kg/m   Physical Exam Vitals and nursing note reviewed.  Constitutional:      General: He is not in acute distress.    Appearance: He is ill-appearing.     Comments: Patient appears fatigued  HENT:     Head: Normocephalic and atraumatic.     Mouth/Throat:     Mouth: Mucous membranes are dry.  Eyes:     General: No scleral icterus.  Conjunctiva/sclera: Conjunctivae normal.  Cardiovascular:     Rate and Rhythm: Tachycardia present.  Pulmonary:     Effort: Pulmonary effort is normal. No respiratory distress.     Breath sounds: Normal breath sounds. No wheezing.  Abdominal:     General: Abdomen is flat. There is no distension.     Palpations: Abdomen is soft.     Tenderness: There is no abdominal tenderness. There is no guarding.  Musculoskeletal:        General: No deformity or signs of injury.     Cervical back: Normal range of motion.     Comments: Right foot status post amputation of the right second digit, there is no crepitus, erythema or drainage, wound appears well-healed  Skin:    Coloration: Skin is not jaundiced or pale.  Neurological:     General: No focal deficit present.     Mental Status: He is alert and oriented to person, place, and time. Mental status is at baseline.  Psychiatric:        Mood and Affect: Mood normal.        Behavior: Behavior normal.     LABS (all labs ordered are listed, but only abnormal results are displayed)  Labs Reviewed  COMPREHENSIVE METABOLIC PANEL - Abnormal; Notable for the following components:      Result Value   Sodium 130 (*)    Chloride 96 (*)    Glucose, Bld 153 (*)    BUN 26 (*)    Creatinine, Ser 1.36 (*)    Calcium 8.5 (*)    Albumin 2.9 (*)    AST 50 (*)    ALT 72 (*)    Alkaline  Phosphatase 156 (*)    GFR, Estimated 56 (*)    All other components within normal limits  CBC WITH DIFFERENTIAL/PLATELET - Abnormal; Notable for the following components:   WBC 24.2 (*)    RBC 3.87 (*)    Hemoglobin 11.8 (*)    HCT 33.5 (*)    Neutro Abs 22.8 (*)    Lymphs Abs 0.2 (*)    Abs Immature Granulocytes 0.46 (*)    All other components within normal limits  LACTIC ACID, PLASMA - Abnormal; Notable for the following components:   Lactic Acid, Venous 2.4 (*)    All other components within normal limits  SEDIMENTATION RATE - Abnormal; Notable for the following components:   Sed Rate 52 (*)    All other components within normal limits  URINALYSIS, COMPLETE (UACMP) WITH MICROSCOPIC - Abnormal; Notable for the following components:   Color, Urine YELLOW (*)    APPearance CLEAR (*)    Specific Gravity, Urine 1.032 (*)    Hgb urine dipstick SMALL (*)    Ketones, ur 5 (*)    Protein, ur 30 (*)    Leukocytes,Ua TRACE (*)    All other components within normal limits  TROPONIN I (HIGH SENSITIVITY) - Abnormal; Notable for the following components:   Troponin I (High Sensitivity) 60 (*)    All other components within normal limits  TROPONIN I (HIGH SENSITIVITY) - Abnormal; Notable for the following components:   Troponin I (High Sensitivity) 69 (*)    All other components within normal limits  RESP PANEL BY RT-PCR (FLU A&B, COVID) ARPGX2  CULTURE, BLOOD (ROUTINE X 2)  CULTURE, BLOOD (ROUTINE X 2)  LACTIC ACID, PLASMA   ____________________________________________  EKG  Right bundle branch block, normal sinus rhythm, no acute ischemic changes ____________________________________________  RADIOLOGY  I, Madelin Headings, personally viewed and evaluated these images (plain radiographs) as part of my medical decision making, as well as reviewing the written report by the radiologist.  ED MD interpretation: I reviewed the chest x-ray which does not show any acute process    ____________________________________________   PROCEDURES  Procedure(s) performed (including Critical Care):  Procedures   ____________________________________________   INITIAL IMPRESSION / [place] / ED COURSE     Patient is a 70 year old male who presents with weakness and fatigue and nausea after recent amputation of the second toe for osteomyelitis about a week ago.  He was discharged on doxycycline which he thought was making him significantly nauseous.  Stopped taking this and was switched to Bactrim today.  Saw podiatry who was happy with the way the wound was healing.  He has become progressively weak and unable to get out of bed.  He is hypotensive on arrival 100s over 50s and tachycardic. Mildly decreased pulse ox to 94%.  He is afebrile.  Exam he looks fatigued but nontoxic.  Dry mucous membranes.  Abdomen is benign.  The foot does not look acutely infected.  Labs are notable for leukocytosis of 24, lactate of 2.8, increased alk phos and mildly increased AST and ALT, no function at baseline.  Sed rate is 50s, this was not obtained during prior admission.  He was given 2 L normal saline with improvement in his blood pressure.  Covered broadly with Vanco and Zosyn, UA with trace leuks, not very convincing for UTI.  Blood culture x2 sent.  COVID test is negative.  Source of sepsis unclear.  Chest x-ray within normal limits.  Obtained a CT of the abdomen pelvis which shows potentially duodenitis but no other infectious source.  Suspect potential underlying bacteremia related to his osteomyelitis.  He was covered broadly with vancomycin and Zosyn.  Will be admitted to medicine service.  Clinical Course as of 06/12/21 0446  Fri Jun 12, 2021  0153 WBC(!): 24.2 [KM]  0212 Troponin I (High Sensitivity)(!): 60 [KM]  0338  IMPRESSION: Mild wall thickening involving the proximal duodenum, raising the possibility of duodenitis. Adjacent small duodenal diverticulum.   No  evidence of bowel obstruction.  Normal appendix.   [KM]  5697 Leukocytes,Ua(!): TRACE [KM]  0433 WBC, UA: 6-10 [KM]    Clinical Course User Index [KM] Rada Hay, MD     ____________________________________________   FINAL CLINICAL IMPRESSION(S) / ED DIAGNOSES  Final diagnoses:  Sepsis, due to unspecified organism, unspecified whether acute organ dysfunction present [facility])     ED Discharge Orders     None        Note:  This document was prepared using Dragon voice recognition software and may include unintentional dictation errors.    Rada Hay, MD 06/12/21 [phone]

## 2021-06-12 NOTE — ED Notes (Signed)
Pt to ED via EMS from home c/o right toe from amputation on 06/07/21. Pt reports feeling weak.

## 2021-06-12 NOTE — ED Notes (Signed)
Upon arrivel to 2nd floor RN on unit was alterted that room assigned for patient was dirty. This RN was advised to place pt in room 221. Pt assisted to bed with RN on floor.

## 2021-06-13 DIAGNOSIS — R6520 Severe sepsis without septic shock: Secondary | ICD-10-CM | POA: Diagnosis not present

## 2021-06-13 DIAGNOSIS — A419 Sepsis, unspecified organism: Secondary | ICD-10-CM | POA: Diagnosis not present

## 2021-06-13 LAB — BASIC METABOLIC PANEL
Anion gap: 5 (ref 5–15)
BUN: 29 mg/dL — ABNORMAL HIGH (ref 8–23)
CO2: 23 mmol/L (ref 22–32)
Calcium: 7.8 mg/dL — ABNORMAL LOW (ref 8.9–10.3)
Chloride: 103 mmol/L (ref 98–111)
Creatinine, Ser: 1.38 mg/dL — ABNORMAL HIGH (ref 0.61–1.24)
GFR, Estimated: 55 mL/min — ABNORMAL LOW (ref >60)
Glucose, Bld: 97 mg/dL (ref 70–99)
Potassium: 4.1 mmol/L (ref 3.5–5.1)
Sodium: 131 mmol/L — ABNORMAL LOW (ref 135–145)

## 2021-06-13 LAB — LIPID PANEL
Cholesterol: 128 mg/dL (ref 0–200)
HDL: 22 mg/dL — ABNORMAL LOW (ref >40)
LDL Cholesterol: 84 mg/dL (ref 0–99)
Total CHOL/HDL Ratio: 5.8 RATIO
Triglycerides: 108 mg/dL (ref <150)
VLDL: 22 mg/dL (ref 0–40)

## 2021-06-13 LAB — CBC
HCT: 29 % — ABNORMAL LOW (ref 39.0–52.0)
Hemoglobin: 9.7 g/dL — ABNORMAL LOW (ref 13.0–17.0)
MCH: 29.3 pg (ref 26.0–34.0)
MCHC: 33.4 g/dL (ref 30.0–36.0)
MCV: 87.6 fL (ref 80.0–100.0)
Platelets: 286 10*3/uL (ref 150–400)
RBC: 3.31 MIL/uL — ABNORMAL LOW (ref 4.22–5.81)
RDW: 14.5 % (ref 11.5–15.5)
WBC: 16.3 10*3/uL — ABNORMAL HIGH (ref 4.0–10.5)
nRBC: 0 % (ref 0.0–0.2)

## 2021-06-13 LAB — URINE CULTURE: Culture: NO GROWTH

## 2021-06-13 MED ORDER — CIPROFLOXACIN IN D5W 400 MG/200ML IV SOLN
400.0000 mg | Freq: Two times a day (BID) | INTRAVENOUS | Status: DC
  Administered 2021-06-13 – 2021-06-15 (×5): 400 mg via INTRAVENOUS
  Filled 2021-06-13 (×6): qty 200

## 2021-06-13 MED ORDER — METRONIDAZOLE 500 MG/100ML IV SOLN
500.0000 mg | Freq: Three times a day (TID) | INTRAVENOUS | Status: DC
  Administered 2021-06-13 – 2021-06-15 (×7): 500 mg via INTRAVENOUS
  Filled 2021-06-13 (×8): qty 100

## 2021-06-13 MED ORDER — HYDRALAZINE HCL 20 MG/ML IJ SOLN: 10.0000 mg | INTRAMUSCULAR | Status: DC | PRN

## 2021-06-13 NOTE — Consult Note (Signed)
PODIATRY CONSULTATION  NAME Daniel Leon MRN [id number] DOB 11/29/1950 DOA 06/12/2021   Reason for consult: s/p amputation RT 2nd toe. Sepsis.  Chief Complaint  Patient presents with   Wound Infection    Consulting physician: Lorretta Harp MD  History of present illness: 70 y.o. male PMHx DM diet controlled, HTN, RT bundle blockage, peripheral neuropathy, CKD stage III, h/o BKA LLE presenting to the hospital after recent RT 2nd toe amputation 06/07/2021 with Dr. Allena Katz due to OM of the toe. Patient has not been feeling well over the past few days and was seen by Family Medicine 06/11/21 who did bloodwork and sent to hospital for further workup and evaluation. Podiatry consulted after admission.  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    Charcot ankle, left    collapse   Charcot's joint of foot    Diabetes mellitus without complication (HCC)    DIET-CONTROLLED   Early cataracts, bilateral    Essential hypertension    Right bundle branch block (RBBB)    Traumatic arthropathy of ankle and foot    Wears glasses     CBC Latest Ref Rng & Units 06/13/2021 06/12/2021 06/11/2021  WBC 4.0 - 10.5 K/uL 16.3(H) 24.2(H) 16.1(H)  Hemoglobin 13.0 - 17.0 g/dL 0.1(B) 11.8(L) 13.0  Hematocrit 39.0 - 52.0 % 29.0(L) 33.5(L) 36.6(L)  Platelets 150 - 400 K/uL 286 327 491(H)    BMP Latest Ref Rng & Units 06/13/2021 06/12/2021 06/08/2021  Glucose 70 - 99 mg/dL 97 510(C) 585(I)  BUN 8 - 23 mg/dL 77(O) 24(M) 35(T)  Creatinine 0.61 - 1.24 mg/dL 6.14(E) 3.15(Q) 0.08(Q)  BUN/Creat Ratio 10 - 24 - - -  Sodium 135 - 145 mmol/L 131(L) 130(L) 131(L)  Potassium 3.5 - 5.1 mmol/L 4.1 3.9 4.4  Chloride 98 - 111 mmol/L 103 96(L) 104  CO2 22 - 32 mmol/L 23 22 22   Calcium 8.9 - 10.3 mg/dL 7.8(L) 8.5(L) 8.4(L)       Physical Exam: General: The patient is alert and oriented x3 in no acute distress.   Dermatology: The skin to the RT foot is cool  with a well healing amputation site of the toe. No erythema or edema noted in the foot.  Vascular: Palpable pulses RT. Capillary refill normal   Neurological: Light touch and protective threshold absent.  Musculoskeletal Exam: H/o BKA LLE. RT 2nd toe amputation 06/07/21.    ASSESSMENT/PLAN OF CARE S/p RT 2nd toe amputation 06/07/2021 - MRI reviewed which essentially demonstrates no changes since MRI taken 06/07/2021. - clinically I do not find any evidence of acute infection or inflammation of the foot. MRI findings suggest no change from MRI taken 5 days prior.  - after evaluating the patient, I do not believe the source of sepsis is coming from the foot. This looks like a well healing toe amputation.  - podiatry will continue to observe for now while inpatient. No intervention warranted at this time from a foot standpoint     Thank you for the consult.  Please contact me directly with any questions or concerns.  Cell [phone]   [phone], DPM [facility]  Dr. Felecia [address]ing, DPM    [address]  [place], [place] [address]                Office [phone]  Fax [phone]

## 2021-06-13 NOTE — Consult Note (Signed)
WOC Nurse Consult Note: Reason for Consult:Right foot, second digit amputation site with sutures intact.  Surgery performed 10/16. Dr. Logan Bores saw yesterday. Requested for topical care guidance. Wound type:surgical Pressure Injury POA: N/A Measurement:N/A Wound TMY:TRZNBVA approximately Drainage (amount, consistency, odor) none Periwound: intact, mild erythema (appropriate post op response) Dressing procedure/placement/frequency: I have provided guidance for Nursing to keep the incision clean and closely approximated using a daily saline cleanse, pat dry and following with a betadine swabstick application over the sutures and topping with a dry gauze 2x2 when dry.   WOC nursing team will not follow, but will remain available to this patient, the nursing and medical teams.  Please re-consult if needed. Thanks, Ladona Mow, MSN, RN, GNP, [place]  Pager# [phone]

## 2021-06-13 NOTE — Progress Notes (Signed)
PROGRESS NOTE    Daniel Leon  HWK:[id number] DOB: 1951-07-10 DOA: 06/12/2021 PCP: Valerie Roys, DO    Brief Narrative:  70 y.o. male with medical history significant of hypertension, diet-controlled diabetes, right bundle blockade, peripheral neuropathy, BPH, anemia, s/p of left BKA, CKD-3A, s/p of right second toe amputation due to to osteomyelitis, who presents with weakness and nausea.   Patient has history of right second toe osteomyelitis.  Patient is s/p of right second toe amputation on 06/07/2021.  Patient was initially treated with doxycycline.  He states that he has not been feeling well in the past several days.  Patient has severe generalized weakness, poor appetite, decreased oral intake.  He has nausea and dry heaves, no diarrhea or abdominal pain.  No fever or chills.  Patient has mild dry cough, no chest pain or shortness of breath.  Denies symptoms of UTI. He states that he does not have worsening pain in in the surgical site.  No active drainage noted.  Patient was seen by his podiatrist yesterday, who thought that his wound looks okay.  Patient was started on Bactrim.  On admission patient was seen in consultation by podiatry who did not feel that the toe was the source of sepsis.  No surgical intervention warranted.  CT imaging survey concerning for duodenitis.  Patient does report subjective improvement since initiation of antibiotics and IV fluids.   Assessment & Plan:   Principal Problem:   Severe sepsis ([place]) Active Problems:   Essential hypertension   Normocytic anemia   UTI (urinary tract infection)   Cellulitis of second toe of right foot   CKD (chronic kidney disease), stage IIIa   Duodenitis   Elevated troponin   Abnormal LFTs  Duodenitis Right lower extremity cellulitis Possible urinary tract infection Severe sepsis secondary to above Criteria met with leukocytosis, tachycardia, tachypnea, elevated lactic acid Initial hypotension resolved  with IV fluid resuscitation Suspect that duodenitis is primary driver for infection No evidence of osteomyelitis No surgical intervention from podiatry standpoint Plan: DC vancomycin and Zosyn Start ciprofloxacin and metronidazole for intra-abdominal coverage IV fluids As needed pain control As needed antiemetics Follow cultures Monitor vitals and fever curve  Essential hypertension Blood pressure currently stable off of medications Continue to hold BP home medications And as needed IV hydralazine Restart as appropriate  Normocytic anemia Suspect anemia of chronic kidney disease Hemoglobin 11.8, stable    CKD (chronic kidney disease), stage IIIa::  Renal function close to baseline.  Recent baseline creatinine 1.15-1.39, his creatinine is 1.36, BUN 26.   Abnormal liver function Patient has some mild abnormal liver function, likely due to sepsis. -Monitor   Elevated troponin:  Troponin level 60, 69, no chest pain, likely due to demand ischemia. -Monitor     DVT prophylaxis: SQ heparin Code Status: Full Family Communication: None today.  Offered to call but the patient declined Disposition Plan: Status is: Inpatient  Remains inpatient appropriate because: Severe sepsis secondary to likely duodenitis.  Sepsis physiology improving or minimal.  Anticipate disposition within 48 hours.       Level of care: Med-Surg  Consultants:  Podiatry  Procedures:  None  Antimicrobials: Ciprofloxacin Metronidazole   Subjective: Patient seen and examined.  Reports improvement in subjective symptoms since admission.  No pain complaints.  Does endorse nausea  Objective: Vitals:   06/12/21 1513 06/12/21 1540 06/12/21 1930 06/13/21 0700  BP: 103/71 100/66 107/71 113/70  Pulse: 96 92 96 98  Resp: '20 19 16 ' 20  Temp:  98.5 F (36.9 C) 97.8 F (36.6 C) 98.7 F (37.1 C)  TempSrc:  Oral Oral Oral  SpO2: 96% 96% 94% 94%  Weight:      Height:        Intake/Output  Summary (Last 24 hours) at 06/13/2021 1234 Last data filed at 06/12/2021 2100 Gross per 24 hour  Intake 1810.11 ml  Output 550 ml  Net 1260.11 ml   Filed Weights   06/12/21 0121  Weight: 112.9 kg    Examination:  General exam: Appears calm and comfortable  Respiratory system: Lungs clear.  Normal work of breathing.  Room air Cardiovascular system: S1-S2, RRR, no murmurs Gastrointestinal system: Obese, soft, nondistended, TTP epigastrium Central nervous system: Alert and oriented. No focal neurological deficits. Extremities: Status post left BKA status post right second toe amputation Skin: No rashes, lesions or ulcers Psychiatry: Judgement and insight appear normal. Mood & affect appropriate.     Data Reviewed: I have personally reviewed following labs and imaging studies  CBC: Recent Labs  Lab 06/06/21 2212 06/11/21 1507 06/12/21 0120 06/13/21 0503  WBC 6.7 16.1* 24.2* 16.3*  NEUTROABS  --  12.9* 22.8*  --   HGB 11.4* 13.0 11.8* 9.7*  HCT 33.1* 36.6* 33.5* 29.0*  MCV 88.7 84 86.6 87.6  PLT 203 491* 327 295   Basic Metabolic Panel: Recent Labs  Lab 06/06/21 2212 06/08/21 0532 06/12/21 0120 06/13/21 0503  NA 133* 131* 130* 131*  K 4.8 4.4 3.9 4.1  CL 103 104 96* 103  CO2 20* '22 22 23  ' GLUCOSE 196* 141* 153* 97  BUN 38* 26* 26* 29*  CREATININE 2.28* 1.39* 1.36* 1.38*  CALCIUM 8.7* 8.4* 8.5* 7.8*   GFR: Estimated Creatinine Clearance: 67.5 mL/min (A) (by C-G formula based on SCr of 1.38 mg/dL (H)). Liver Function Tests: Recent Labs  Lab 06/12/21 0120  AST 50*  ALT 72*  ALKPHOS 156*  BILITOT 1.1  PROT 7.0  ALBUMIN 2.9*   No results for input(s): LIPASE, AMYLASE in the last 168 hours. No results for input(s): AMMONIA in the last 168 hours. Coagulation Profile: Recent Labs  Lab 06/12/21 1556  INR 1.3*   Cardiac Enzymes: No results for input(s): CKTOTAL, CKMB, CKMBINDEX, TROPONINI in the last 168 hours. BNP (last 3 results) No results for  input(s): PROBNP in the last 8760 hours. HbA1C: No results for input(s): HGBA1C in the last 72 hours. CBG: Recent Labs  Lab 06/07/21 0835 06/07/21 1023 06/07/21 1635 06/08/21 0853  GLUCAP 122* 138* 124* 118*   Lipid Profile: Recent Labs    06/13/21 0503  CHOL 128  HDL 22*  LDLCALC 84  TRIG 108  CHOLHDL 5.8   Thyroid Function Tests: No results for input(s): TSH, T4TOTAL, FREET4, T3FREE, THYROIDAB in the last 72 hours. Anemia Panel: No results for input(s): VITAMINB12, FOLATE, FERRITIN, TIBC, IRON, RETICCTPCT in the last 72 hours. Sepsis Labs: Recent Labs  Lab 06/06/21 2212 06/07/21 0545 06/12/21 0120 06/12/21 0338 06/12/21 1556  PROCALCITON 0.28  --   --   --  4.63  LATICACIDVEN  --  0.7 2.4* 1.8  --     Recent Results (from the past 240 hour(s))  Culture, blood (Routine X 2) w Reflex to ID Panel     Status: None   Collection Time: 06/07/21  3:01 AM   Specimen: Right Antecubital; Blood  Result Value Ref Range Status   Specimen Description RIGHT ANTECUBITAL  Final   Special Requests   Final  BOTTLES DRAWN AEROBIC AND ANAEROBIC Blood Culture results may not be optimal due to an excessive volume of blood received in culture bottles   Culture   Final    NO GROWTH 5 DAYS Performed at [facility], [address] [place], [place] [address]    Report Status 06/12/2021 FINAL  Final  Resp Panel by RT-PCR (Flu A&B, Covid) Nasopharyngeal Swab     Status: None   Collection Time: 06/07/21  3:02 AM   Specimen: Nasopharyngeal Swab; Nasopharyngeal(NP) swabs in vial transport medium  Result Value Ref Range Status   SARS Coronavirus 2 by RT PCR NEGATIVE NEGATIVE Final    Comment: (NOTE) SARS-CoV-2 target nucleic acids are NOT DETECTED.  The SARS-CoV-2 RNA is generally detectable in upper respiratory specimens during the acute phase of infection. The lowest concentration of SARS-CoV-2 viral copies this assay can detect is 138 copies/mL. A negative result does  not preclude SARS-Cov-2 infection and should not be used as the sole basis for treatment or other patient management decisions. A negative result may occur with  improper specimen collection/handling, submission of specimen other than nasopharyngeal swab, presence of viral mutation(s) within the areas targeted by this assay, and inadequate number of viral copies(<138 copies/mL). A negative result must be combined with clinical observations, patient history, and epidemiological information. The expected result is Negative.  Fact Sheet for Patients:  EntrepreneurPulse.com.au  Fact Sheet for Healthcare Providers:  IncredibleEmployment.be  This test is no t yet approved or cleared by the [place] FDA and  has been authorized for detection and/or diagnosis of SARS-CoV-2 by FDA under an Emergency Use Authorization (EUA). This EUA will remain  in effect (meaning this test can be used) for the duration of the COVID-19 declaration under Section 564(b)(1) of the Act, 21 U.S.C.section 360bbb-3(b)(1), unless the authorization is terminated  or revoked sooner.       Influenza A by PCR NEGATIVE NEGATIVE Final   Influenza B by PCR NEGATIVE NEGATIVE Final    Comment: (NOTE) The Xpert Xpress SARS-CoV-2/FLU/RSV plus assay is intended as an aid in the diagnosis of influenza from Nasopharyngeal swab specimens and should not be used as a sole basis for treatment. Nasal washings and aspirates are unacceptable for Xpert Xpress SARS-CoV-2/FLU/RSV testing.  Fact Sheet for Patients: EntrepreneurPulse.com.au  Fact Sheet for Healthcare Providers: IncredibleEmployment.be  This test is not yet approved or cleared by the [place] FDA and has been authorized for detection and/or diagnosis of SARS-CoV-2 by FDA under an Emergency Use Authorization (EUA). This EUA will remain in effect (meaning this test can be used) for the  duration of the COVID-19 declaration under Section 564(b)(1) of the Act, 21 U.S.C. section 360bbb-3(b)(1), unless the authorization is terminated or revoked.  Performed at [facility], [address] [place], [place] [address]   Culture, blood (Routine X 2) w Reflex to ID Panel     Status: None   Collection Time: 06/07/21  3:06 AM   Specimen: BLOOD LEFT FOREARM  Result Value Ref Range Status   Specimen Description BLOOD LEFT FOREARM  Final   Special Requests   Final    BOTTLES DRAWN AEROBIC AND ANAEROBIC Blood Culture adequate volume   Culture   Final    NO GROWTH 5 DAYS Performed at [facility], [address] [place], [place] [address]    Report Status 06/12/2021 FINAL  Final  Urine Culture     Status: None   Collection Time: 06/07/21  3:13 AM  Specimen: Urine, Clean Catch  Result Value Ref Range Status   Specimen Description   Final    URINE, CLEAN CATCH Performed at [facility], [address] [place], [place] [address]    Special Requests   Final    NONE Performed at [facility], [address], [place], [place] [address]    Culture   Final    NO GROWTH Performed at [facility], [place] [address], [place], [place] [address]    Report Status 06/08/2021 FINAL  Final  Microscopic Examination     Status: Abnormal   Collection Time: 06/11/21  3:07 PM   BLD  Result Value Ref Range Status   WBC, UA None seen 0 - 5 /hpf Final   RBC 0-2 0 - 2 /hpf Final   Epithelial Cells (non renal) None seen 0 - 10 /hpf Final   Casts Present (A) None seen /lpf Final   Cast Type Hyaline casts N/A Final   Crystals Present (A) N/A Final   Crystal Type Uric Acid N/A Final   Mucus, UA Present (A) Not Estab. Final   Bacteria, UA None seen None seen/Few Final  Blood culture (routine x 2)     Status: None (Preliminary result)   Collection Time: 06/12/21  1:27 AM   Specimen: BLOOD  Result Value Ref Range Status   Specimen Description BLOOD  LEFT FA  Final   Special Requests   Final    BOTTLES DRAWN AEROBIC AND ANAEROBIC Blood Culture adequate volume   Culture   Final    NO GROWTH < 12 HOURS Performed at [facility], [address] [place], [place] [address]    Report Status PENDING  Incomplete  Blood culture (routine x 2)     Status: None (Preliminary result)   Collection Time: 06/12/21  1:27 AM   Specimen: BLOOD  Result Value Ref Range Status   Specimen Description BLOOD LEFT HAND  Final   Special Requests   Final    BOTTLES DRAWN AEROBIC AND ANAEROBIC Blood Culture adequate volume   Culture   Final    NO GROWTH < 12 HOURS Performed at [facility], [address] [place], [place] [address]    Report Status PENDING  Incomplete  Resp Panel by RT-PCR (Flu A&B, Covid) Nasopharyngeal Swab     Status: None   Collection Time: 06/12/21  1:29 AM   Specimen: Nasopharyngeal Swab; Nasopharyngeal(NP) swabs in vial transport medium  Result Value Ref Range Status   SARS Coronavirus 2 by RT PCR NEGATIVE NEGATIVE Final    Comment: (NOTE) SARS-CoV-2 target nucleic acids are NOT DETECTED.  The SARS-CoV-2 RNA is generally detectable in upper respiratory specimens during the acute phase of infection. The lowest concentration of SARS-CoV-2 viral copies this assay can detect is 138 copies/mL. A negative result does not preclude SARS-Cov-2 infection and should not be used as the sole basis for treatment or other patient management decisions. A negative result may occur with  improper specimen collection/handling, submission of specimen other than nasopharyngeal swab, presence of viral mutation(s) within the areas targeted by this assay, and inadequate number of viral copies(<138 copies/mL). A negative result must be combined with clinical observations, patient history, and epidemiological information. The expected result is Negative.  Fact Sheet for Patients:  EntrepreneurPulse.com.au  Fact  Sheet for Healthcare Providers:  IncredibleEmployment.be  This test is no t yet approved or cleared by the [place] and  has been authorized  for detection and/or diagnosis of SARS-CoV-2 by FDA under an Emergency Use Authorization (EUA). This EUA will remain  in effect (meaning this test can be used) for the duration of the COVID-19 declaration under Section 564(b)(1) of the Act, 21 U.S.C.section 360bbb-3(b)(1), unless the authorization is terminated  or revoked sooner.       Influenza A by PCR NEGATIVE NEGATIVE Final   Influenza B by PCR NEGATIVE NEGATIVE Final    Comment: (NOTE) The Xpert Xpress SARS-CoV-2/FLU/RSV plus assay is intended as an aid in the diagnosis of influenza from Nasopharyngeal swab specimens and should not be used as a sole basis for treatment. Nasal washings and aspirates are unacceptable for Xpert Xpress SARS-CoV-2/FLU/RSV testing.  Fact Sheet for Patients: EntrepreneurPulse.com.au  Fact Sheet for Healthcare Providers: IncredibleEmployment.be  This test is not yet approved or cleared by the [place] FDA and has been authorized for detection and/or diagnosis of SARS-CoV-2 by FDA under an Emergency Use Authorization (EUA). This EUA will remain in effect (meaning this test can be used) for the duration of the COVID-19 declaration under Section 564(b)(1) of the Act, 21 U.S.C. section 360bbb-3(b)(1), unless the authorization is terminated or revoked.  Performed at [facility], [address], [place], [place] [address]   Urine Culture     Status: None   Collection Time: 06/12/21  3:36 AM   Specimen: Urine, Random  Result Value Ref Range Status   Specimen Description   Final    URINE, RANDOM Performed at [facility], [address] [place], [place] [address]    Special Requests   Final    NONE Performed at [facility], [address], [place],  [place] [address]    Culture   Final    NO GROWTH Performed at [facility], [place] [address], [place], [place] [address]    Report Status 06/13/2021 FINAL  Final         Radiology Studies: CT ABDOMEN PELVIS W CONTRAST  Result Date: 06/12/2021 CLINICAL DATA:  Abdominal pain, fever EXAM: CT ABDOMEN AND PELVIS WITH CONTRAST TECHNIQUE: Multidetector CT imaging of the abdomen and pelvis was performed using the standard protocol following bolus administration of intravenous contrast. CONTRAST:  149m OMNIPAQUE IOHEXOL 350 MG/ML SOLN COMPARISON:  None. FINDINGS: Lower chest: Mild bibasilar atelectasis. Hepatobiliary: 2.8 cm cyst in segment 6. Gallbladder is unremarkable. No intrahepatic or extrahepatic ductal dilatation. Pancreas: Within normal limits. Spleen: Within normal limits. Adrenals/Urinary Tract: Adrenal glands are within normal limits. Kidneys are within normal limits.  No hydronephrosis. Bladder is within normal limits. Stomach/Bowel: Stomach is within normal limits. Mild wall thickening involving the proximal duodenum with suspected small duodenal diverticulum (coronal image 46). No evidence of bowel obstruction. Normal appendix (series 2/image 71). No colonic wall thickening or inflammatory changes. Vascular/Lymphatic: No evidence of abdominal aortic aneurysm. Atherosclerotic calcifications of the abdominal aorta and branch vessels. No suspicious abdominopelvic lymphadenopathy. Reproductive: Prostate is unremarkable. Other: No abdominopelvic ascites. Musculoskeletal: Degenerative changes of the visualized thoracolumbar spine. IMPRESSION: Mild wall thickening involving the proximal duodenum, raising the possibility of duodenitis. Adjacent small duodenal diverticulum. No evidence of bowel obstruction.  Normal appendix. Electronically Signed   By: SJulian HyM.D.   On: 06/12/2021 03:27   MR FOOT RIGHT WO CONTRAST  Result Date: 06/12/2021 CLINICAL DATA:  Swelling of the foot, diabetes.  Recent (06/07/2021) amputation of the right second toe at the diaphyseal level of the proximal phalanx. EXAM: MRI OF THE RIGHT FOREFOOT WITHOUT CONTRAST TECHNIQUE:  Multiplanar, multisequence MR imaging of the right forefoot was performed. No intravenous contrast was administered. COMPARISON:  06/12/2021 radiographs and MRI from 06/07/2021 FINDINGS: Despite efforts by the technologist and patient, prominent motion artifact is present on today's exam and could not be eliminated. This reduces exam sensitivity and specificity. Bones/Joint/Cartilage Amputation of the second toe at the level of the mid diaphysis of the proximal phalanx. Trace fluid signal intensity just distal to the bony margin on image 19 series 10 but no findings of active osteomyelitis involving the remainder of the proximal phalanx. Stable arthropathy between the middle cuneiform and the base of the second metatarsal, with associated marrow edema and erosion or subcortical cyst along the base of the second metatarsal on image 9 series 7. We again demonstrate substantial erosions along the medial, lateral, and distal articular margins of the first metatarsal head best depicted on the T1 weighted images such as images 10 through 13 of series 7. There is associated marrow edema in the left first metatarsal head and distal shaft. Degenerative findings but no substantial degree of edema in the proximal phalanx of the great toe. Effusion of the first MTP joint is again noted particularly dorsally where there a septated and somewhat heterogeneous effusion on image 26 series 10 similar to prior. Mild sclerosis of the first digit medial sesamoid. Overall these findings are not changed from prior, with the erosions along the first metatarsal head potentially from gout/crystal arthropathy or septic joint. Small erosion or degenerative subcortical cyst in the head of the fifth metatarsal, image 12 series 7, similar to prior. There is low-level marrow edema in  the proximal diaphysis of the fifth metatarsal which is unchanged and probably from stress reaction. Additional mild degenerative findings noted along the midfoot and Chopart joint. Ligaments The Lisfranc ligament appears intact. Muscles and Tendons Flexor digitorum longus tenosynovitis and peritendinitis distally, mildly increased from the 06/07/2021 exam. Regional muscular atrophy with low-level accentuated T2 signal along the regional musculature which is probably neuropathic. Soft tissues Mild dorsal subcutaneous edema along the forefoot. IMPRESSION: 1. Interval amputation of the second toe at the level of the mid diaphysis of the proximal phalanx, without evidence of osteomyelitis involving the remaining portion of the proximal phalanx. 2. Stable appearance of substantial erosive arthropathy at the first MTP joint especially involving the first metatarsal head, with associated marrow edema in the metatarsal head and distal shaft. Complex dorsal effusion. The appearance tends to favor gout arthropathy, but as before, infection is not readily excluded. No substantial change over the last 5 days. 3. Stable stress reaction in the fifth metatarsal, with low-level marrow edema. Small erosion or degenerative subcortical cyst in the head of the fifth metatarsal, stable. 4. Increased flexor digitorum longus tenosynovitis and peritendinitis. 5. Mild dorsal subcutaneous edema along the forefoot, cellulitis is not excluded. Electronically Signed   By: Van Clines M.D.   On: 06/12/2021 12:13   DG Chest Portable 1 View  Result Date: 06/12/2021 CLINICAL DATA:  Weakness EXAM: PORTABLE CHEST 1 VIEW COMPARISON:  None. FINDINGS: Lungs are clear.  No pleural effusion or pneumothorax. The heart is top-normal in size. IMPRESSION: No evidence of acute cardiopulmonary disease. Electronically Signed   By: Julian Hy M.D.   On: 06/12/2021 01:55   DG Foot Complete Right  Result Date: 06/12/2021 CLINICAL DATA:   Right toe amputation on 10/16 for osteo, weakness EXAM: RIGHT FOOT COMPLETE - 3+ VIEW COMPARISON:  None. FINDINGS: No fracture or dislocation is seen. Status post amputation of  the 2nd digit at the level of the base of the proximal phalanx. Mild soft tissue swelling. The joint spaces are preserved. IMPRESSION: Status post 2nd digit amputation. No acute findings. Electronically Signed   By: Julian Hy M.D.   On: 06/12/2021 01:55        Scheduled Meds:  cholecalciferol  5,000 Units Oral Daily   gabapentin  400 mg Oral Daily   gabapentin  800 mg Oral QHS   heparin  5,000 Units Subcutaneous Q8H   multivitamin with minerals  1 tablet Oral Daily   pantoprazole (PROTONIX) IV  40 mg Intravenous Q12H   Continuous Infusions:  sodium chloride 100 mL/hr at 06/13/21 0134   ciprofloxacin 400 mg (06/13/21 1215)   metronidazole 500 mg (06/13/21 1051)     LOS: 1 day    Time spent: 35 minutes    Sidney Ace, MD [facility]   If 7PM-7AM, please contact night-coverage  06/13/2021, 12:34 PM

## 2021-06-14 DIAGNOSIS — A419 Sepsis, unspecified organism: Secondary | ICD-10-CM | POA: Diagnosis not present

## 2021-06-14 DIAGNOSIS — R6520 Severe sepsis without septic shock: Secondary | ICD-10-CM | POA: Diagnosis not present

## 2021-06-14 LAB — URINE CULTURE: Organism ID, Bacteria: NO GROWTH

## 2021-06-14 MED ORDER — ENSURE ENLIVE PO LIQD
237.0000 mL | Freq: Two times a day (BID) | ORAL | Status: DC
  Administered 2021-06-14 – 2021-06-21 (×15): 237 mL via ORAL

## 2021-06-14 MED ORDER — LISINOPRIL 10 MG PO TABS
15.0000 mg | ORAL_TABLET | Freq: Every day | ORAL | Status: DC
  Administered 2021-06-14 – 2021-06-23 (×10): 15 mg via ORAL
  Filled 2021-06-14 (×10): qty 2

## 2021-06-14 MED ORDER — ONDANSETRON HCL 4 MG/2ML IJ SOLN
4.0000 mg | Freq: Four times a day (QID) | INTRAMUSCULAR | Status: DC | PRN
Start: 2021-06-14 — End: 2021-06-23

## 2021-06-14 NOTE — Progress Notes (Signed)
PROGRESS NOTE    Daniel Leon  URK:[id number] DOB: 05-Mar-1951 DOA: 06/12/2021 PCP: Valerie Roys, DO    Brief Narrative:  70 y.o. male with medical history significant of hypertension, diet-controlled diabetes, right bundle blockade, peripheral neuropathy, BPH, anemia, s/p of left BKA, CKD-3A, s/p of right second toe amputation due to to osteomyelitis, who presents with weakness and nausea.   Patient has history of right second toe osteomyelitis.  Patient is s/p of right second toe amputation on 06/07/2021.  Patient was initially treated with doxycycline.  He states that he has not been feeling well in the past several days.  Patient has severe generalized weakness, poor appetite, decreased oral intake.  He has nausea and dry heaves, no diarrhea or abdominal pain.  No fever or chills.  Patient has mild dry cough, no chest pain or shortness of breath.  Denies symptoms of UTI. He states that he does not have worsening pain in in the surgical site.  No active drainage noted.  Patient was seen by his podiatrist yesterday, who thought that his wound looks okay.  Patient was started on Bactrim.  On admission patient was seen in consultation by podiatry who did not feel that the toe was the source of sepsis.  No surgical intervention warranted.  CT imaging survey concerning for duodenitis.  Patient does report subjective improvement since initiation of antibiotics and IV fluids.   Assessment & Plan:   Principal Problem:   Severe sepsis ([place]) Active Problems:   Essential hypertension   Normocytic anemia   UTI (urinary tract infection)   Cellulitis of second toe of right foot   CKD (chronic kidney disease), stage IIIa   Duodenitis   Elevated troponin   Abnormal LFTs  Duodenitis Right lower extremity cellulitis Possible urinary tract infection Severe sepsis secondary to above Criteria met with leukocytosis, tachycardia, tachypnea, elevated lactic acid Initial hypotension resolved  with IV fluid resuscitation Suspect that duodenitis is primary driver for infection No evidence of osteomyelitis No surgical intervention from podiatry standpoint Plan: Continue Cipro plus Flagyl Continue IVF Added supplemental shakes As needed pain control As needed antiemetics Follow cultures Monitor vitals and fever curve Tentative discharge 7/24  Essential hypertension Restart home lisinopril Continue as needed IV hydralazine  Normocytic anemia Suspect anemia of chronic kidney disease Hemoglobin 11.8, stable    CKD (chronic kidney disease), stage IIIa::  Renal function close to baseline.  Recent baseline creatinine 1.15-1.39, his creatinine is 1.36, BUN 26.   Abnormal liver function Patient has some mild abnormal liver function, likely due to sepsis. -Monitor   Elevated troponin:  Troponin level 60, 69, no chest pain, likely due to demand ischemia. -Monitor     DVT prophylaxis: SQ heparin Code Status: Full Family Communication: None today.  Offered to call but the patient declined Disposition Plan: Status is: Inpatient  Remains inpatient appropriate because: Severe sepsis secondary to likely duodenitis.  Sepsis physiology improving or minimal.  Anticipate discharge 10/24       Level of care: Med-Surg  Consultants:  Podiatry  Procedures:  None  Antimicrobials: Ciprofloxacin Metronidazole   Subjective: Patient seen and examined.  Reports improvement in subjective symptoms since admission.  No pain complaints.  Does endorse nausea and poor appetite  Objective: Vitals:   06/13/21 1551 06/13/21 2244 06/14/21 0610 06/14/21 0750  BP: 131/80 135/82 140/85 137/87  Pulse: 95 99 100 99  Resp: 20 (!) _0 Temp: 98.2 F (36.8 C) 98.1 F (36.7 C) 98.9 F (  37.2 C) 98.3 F (36.8 C)  TempSrc: Oral Oral Oral Oral  SpO2: 95% 90% 91% 91%  Weight:      Height:        Intake/Output Summary (Last 24 hours) at 06/14/2021 1020 Last data filed at  06/14/2021 0500 Gross per 24 hour  Intake 2119.69 ml  Output 1300 ml  Net 819.69 ml   Filed Weights   06/12/21 0121  Weight: 112.9 kg    Examination:  General exam: No acute distress Respiratory system: Lungs clear.  Normal work of breathing.  Room air Cardiovascular system: S1-S2, RRR, no murmurs Gastrointestinal system: Obese, soft, nondistended, nontender, positive bowel sounds Central nervous system: Alert and oriented. No focal neurological deficits. Extremities: Status post left BKA status post right second toe amputation Skin: No rashes, lesions or ulcers Psychiatry: Judgement and insight appear normal. Mood & affect appropriate.     Data Reviewed: I have personally reviewed following labs and imaging studies  CBC: Recent Labs  Lab 06/11/21 1507 06/12/21 0120 06/13/21 0503  WBC 16.1* 24.2* 16.3*  NEUTROABS 12.9* 22.8*  --   HGB 13.0 11.8* 9.7*  HCT 36.6* 33.5* 29.0*  MCV 84 86.6 87.6  PLT 491* 327 962   Basic Metabolic Panel: Recent Labs  Lab 06/08/21 0532 06/12/21 0120 06/13/21 0503  NA 131* 130* 131*  K 4.4 3.9 4.1  CL 104 96* 103  CO2 _0 GLUCOSE 141* 153* 97  BUN 26* 26* 29*  CREATININE 1.39* 1.36* 1.38*  CALCIUM 8.4* 8.5* 7.8*   GFR: Estimated Creatinine Clearance: 67.5 mL/min (A) (by C-G formula based on SCr of 1.38 mg/dL (H)). Liver Function Tests: Recent Labs  Lab 06/12/21 0120  AST 50*  ALT 72*  ALKPHOS 156*  BILITOT 1.1  PROT 7.0  ALBUMIN 2.9*   No results for input(s): LIPASE, AMYLASE in the last 168 hours. No results for input(s): AMMONIA in the last 168 hours. Coagulation Profile: Recent Labs  Lab 06/12/21 1556  INR 1.3*   Cardiac Enzymes: No results for input(s): CKTOTAL, CKMB, CKMBINDEX, TROPONINI in the last 168 hours. BNP (last 3 results) No results for input(s): PROBNP in the last 8760 hours. HbA1C: No results for input(s): HGBA1C in the last 72 hours. CBG: Recent Labs  Lab 06/07/21 1023 06/07/21 1635  06/08/21 0853  GLUCAP 138* 124* 118*   Lipid Profile: Recent Labs    06/13/21 0503  CHOL 128  HDL 22*  LDLCALC 84  TRIG 108  CHOLHDL 5.8   Thyroid Function Tests: No results for input(s): TSH, T4TOTAL, FREET4, T3FREE, THYROIDAB in the last 72 hours. Anemia Panel: No results for input(s): VITAMINB12, FOLATE, FERRITIN, TIBC, IRON, RETICCTPCT in the last 72 hours. Sepsis Labs: Recent Labs  Lab 06/12/21 0120 06/12/21 0338 06/12/21 1556  PROCALCITON  --   --  4.63  LATICACIDVEN 2.4* 1.8  --     Recent Results (from the past 240 hour(s))  Culture, blood (Routine X 2) w Reflex to ID Panel     Status: None   Collection Time: 06/07/21  3:01 AM   Specimen: Right Antecubital; Blood  Result Value Ref Range Status   Specimen Description RIGHT ANTECUBITAL  Final   Special Requests   Final    BOTTLES DRAWN AEROBIC AND ANAEROBIC Blood Culture results may not be optimal due to an excessive volume of blood received in culture bottles   Culture   Final    NO GROWTH 5 DAYS Performed at [facility], [address] [place], [place] [address]    Report Status 06/12/2021 FINAL  Final  Resp Panel by RT-PCR (Flu A&B, Covid) Nasopharyngeal Swab     Status: None   Collection Time: 06/07/21  3:02 AM   Specimen: Nasopharyngeal Swab; Nasopharyngeal(NP) swabs in vial transport medium  Result Value Ref Range Status   SARS Coronavirus 2 by RT PCR NEGATIVE NEGATIVE Final    Comment: (NOTE) SARS-CoV-2 target nucleic acids are NOT DETECTED.  The SARS-CoV-2 RNA is generally detectable in upper respiratory specimens during the acute phase of infection. The lowest concentration of SARS-CoV-2 viral copies this assay can detect is 138 copies/mL. A negative result does not preclude SARS-Cov-2 infection and should not be used as the sole basis for treatment or other patient management decisions. A negative result may occur with  improper specimen collection/handling, submission of specimen  other than nasopharyngeal swab, presence of viral mutation(s) within the areas targeted by this assay, and inadequate number of viral copies(<138 copies/mL). A negative result must be combined with clinical observations, patient history, and epidemiological information. The expected result is Negative.  Fact Sheet for Patients:  EntrepreneurPulse.com.au  Fact Sheet for Healthcare Providers:  IncredibleEmployment.be  This test is no t yet approved or cleared by the [place] FDA and  has been authorized for detection and/or diagnosis of SARS-CoV-2 by FDA under an Emergency Use Authorization (EUA). This EUA will remain  in effect (meaning this test can be used) for the duration of the COVID-19 declaration under Section 564(b)(1) of the Act, 21 U.S.C.section 360bbb-3(b)(1), unless the authorization is terminated  or revoked sooner.       Influenza A by PCR NEGATIVE NEGATIVE Final   Influenza B by PCR NEGATIVE NEGATIVE Final    Comment: (NOTE) The Xpert Xpress SARS-CoV-2/FLU/RSV plus assay is intended as an aid in the diagnosis of influenza from Nasopharyngeal swab specimens and should not be used as a sole basis for treatment. Nasal washings and aspirates are unacceptable for Xpert Xpress SARS-CoV-2/FLU/RSV testing.  Fact Sheet for Patients: EntrepreneurPulse.com.au  Fact Sheet for Healthcare Providers: IncredibleEmployment.be  This test is not yet approved or cleared by the [place] FDA and has been authorized for detection and/or diagnosis of SARS-CoV-2 by FDA under an Emergency Use Authorization (EUA). This EUA will remain in effect (meaning this test can be used) for the duration of the COVID-19 declaration under Section 564(b)(1) of the Act, 21 U.S.C. section 360bbb-3(b)(1), unless the authorization is terminated or revoked.  Performed at [facility], [address]  [place], [place] [address]   Culture, blood (Routine X 2) w Reflex to ID Panel     Status: None   Collection Time: 06/07/21  3:06 AM   Specimen: BLOOD LEFT FOREARM  Result Value Ref Range Status   Specimen Description BLOOD LEFT FOREARM  Final   Special Requests   Final    BOTTLES DRAWN AEROBIC AND ANAEROBIC Blood Culture adequate volume   Culture   Final    NO GROWTH 5 DAYS Performed at [facility], [address] [place], [place] [address]    Report Status 06/12/2021 FINAL  Final  Urine Culture     Status: None   Collection Time: 06/07/21  3:13 AM   Specimen: Urine, Clean Catch  Result Value Ref Range Status   Specimen Description   Final    URINE, CLEAN CATCH Performed at [facility], [address], [place], [place] [address]    Special Requests  Final    NONE Performed at [facility], [address], [place], [place] [address]    Culture   Final    NO GROWTH Performed at [facility], [place] [address], [place], [place] [address]    Report Status 06/08/2021 FINAL  Final  Microscopic Examination     Status: Abnormal   Collection Time: 06/11/21  3:07 PM   BLD  Result Value Ref Range Status   WBC, UA None seen 0 - 5 /hpf Final   RBC 0-2 0 - 2 /hpf Final   Epithelial Cells (non renal) None seen 0 - 10 /hpf Final   Casts Present (A) None seen /lpf Final   Cast Type Hyaline casts N/A Final   Crystals Present (A) N/A Final   Crystal Type Uric Acid N/A Final   Mucus, UA Present (A) Not Estab. Final   Bacteria, UA None seen None seen/Few Final  Urine Culture     Status: None   Collection Time: 06/11/21  3:53 PM   Specimen: Urine   UR  Result Value Ref Range Status   Urine Culture, Routine Final report  Final   Organism ID, Bacteria No growth  Final  Blood culture (routine x 2)     Status: None (Preliminary result)   Collection Time: 06/12/21  1:27 AM   Specimen: BLOOD  Result Value Ref Range Status   Specimen Description BLOOD LEFT  FA  Final   Special Requests   Final    BOTTLES DRAWN AEROBIC AND ANAEROBIC Blood Culture adequate volume   Culture   Final    NO GROWTH 2 DAYS Performed at [facility], [address] [place], [place] [address]    Report Status PENDING  Incomplete  Blood culture (routine x 2)     Status: None (Preliminary result)   Collection Time: 06/12/21  1:27 AM   Specimen: BLOOD  Result Value Ref Range Status   Specimen Description BLOOD LEFT HAND  Final   Special Requests   Final    BOTTLES DRAWN AEROBIC AND ANAEROBIC Blood Culture adequate volume   Culture   Final    NO GROWTH 2 DAYS Performed at [facility], [address] [place], [place] [address]    Report Status PENDING  Incomplete  Resp Panel by RT-PCR (Flu A&B, Covid) Nasopharyngeal Swab     Status: None   Collection Time: 06/12/21  1:29 AM   Specimen: Nasopharyngeal Swab; Nasopharyngeal(NP) swabs in vial transport medium  Result Value Ref Range Status   SARS Coronavirus 2 by RT PCR NEGATIVE NEGATIVE Final    Comment: (NOTE) SARS-CoV-2 target nucleic acids are NOT DETECTED.  The SARS-CoV-2 RNA is generally detectable in upper respiratory specimens during the acute phase of infection. The lowest concentration of SARS-CoV-2 viral copies this assay can detect is 138 copies/mL. A negative result does not preclude SARS-Cov-2 infection and should not be used as the sole basis for treatment or other patient management decisions. A negative result may occur with  improper specimen collection/handling, submission of specimen other than nasopharyngeal swab, presence of viral mutation(s) within the areas targeted by this assay, and inadequate number of viral copies(<138 copies/mL). A negative result must be combined with clinical observations, patient history, and epidemiological information. The expected result is Negative.  Fact Sheet for Patients:  EntrepreneurPulse.com.au  Fact Sheet for  Healthcare Providers:  IncredibleEmployment.be  This test is no t yet approved or cleared by the [place] and  has been authorized for detection and/or diagnosis of SARS-CoV-2 by FDA under an Emergency Use Authorization (EUA). This EUA will remain  in effect (meaning this test can be used) for the duration of the COVID-19 declaration under Section 564(b)(1) of the Act, 21 U.S.C.section 360bbb-3(b)(1), unless the authorization is terminated  or revoked sooner.       Influenza A by PCR NEGATIVE NEGATIVE Final   Influenza B by PCR NEGATIVE NEGATIVE Final    Comment: (NOTE) The Xpert Xpress SARS-CoV-2/FLU/RSV plus assay is intended as an aid in the diagnosis of influenza from Nasopharyngeal swab specimens and should not be used as a sole basis for treatment. Nasal washings and aspirates are unacceptable for Xpert Xpress SARS-CoV-2/FLU/RSV testing.  Fact Sheet for Patients: EntrepreneurPulse.com.au  Fact Sheet for Healthcare Providers: IncredibleEmployment.be  This test is not yet approved or cleared by the [place] FDA and has been authorized for detection and/or diagnosis of SARS-CoV-2 by FDA under an Emergency Use Authorization (EUA). This EUA will remain in effect (meaning this test can be used) for the duration of the COVID-19 declaration under Section 564(b)(1) of the Act, 21 U.S.C. section 360bbb-3(b)(1), unless the authorization is terminated or revoked.  Performed at [facility], [address], [place], [place] [address]   Urine Culture     Status: None   Collection Time: 06/12/21  3:36 AM   Specimen: Urine, Random  Result Value Ref Range Status   Specimen Description   Final    URINE, RANDOM Performed at [facility], [address], [place], [place] [address]    Special Requests   Final    NONE Performed at [facility], [address] [place], [place] [address]     Culture   Final    NO GROWTH Performed at [facility], [place] [address] [place], [place] [address]    Report Status 06/13/2021 FINAL  Final         Radiology Studies: MR FOOT RIGHT WO CONTRAST  Result Date: 06/12/2021 CLINICAL DATA:  Swelling of the foot, diabetes. Recent (06/07/2021) amputation of the right second toe at the diaphyseal level of the proximal phalanx. EXAM: MRI OF THE RIGHT FOREFOOT WITHOUT CONTRAST TECHNIQUE: Multiplanar, multisequence MR imaging of the right forefoot was performed. No intravenous contrast was administered. COMPARISON:  06/12/2021 radiographs and MRI from 06/07/2021 FINDINGS: Despite efforts by the technologist and patient, prominent motion artifact is present on today's exam and could not be eliminated. This reduces exam sensitivity and specificity. Bones/Joint/Cartilage Amputation of the second toe at the level of the mid diaphysis of the proximal phalanx. Trace fluid signal intensity just distal to the bony margin on image 19 series 10 but no findings of active osteomyelitis involving the remainder of the proximal phalanx. Stable arthropathy between the middle cuneiform and the base of the second metatarsal, with associated marrow edema and erosion or subcortical cyst along the base of the second metatarsal on image 9 series 7. We again demonstrate substantial erosions along the medial, lateral, and distal articular margins of the first metatarsal head best depicted on the T1 weighted images such as images 10 through 13 of series 7. There is associated marrow edema in the left first metatarsal head and distal shaft. Degenerative findings but no substantial degree of edema in the proximal phalanx of the great toe. Effusion of the first MTP joint is again noted particularly dorsally where there a septated and somewhat heterogeneous effusion on image 26 series 10 similar  to prior. Mild sclerosis of the first digit medial sesamoid. Overall these findings are  not changed from prior, with the erosions along the first metatarsal head potentially from gout/crystal arthropathy or septic joint. Small erosion or degenerative subcortical cyst in the head of the fifth metatarsal, image 12 series 7, similar to prior. There is low-level marrow edema in the proximal diaphysis of the fifth metatarsal which is unchanged and probably from stress reaction. Additional mild degenerative findings noted along the midfoot and Chopart joint. Ligaments The Lisfranc ligament appears intact. Muscles and Tendons Flexor digitorum longus tenosynovitis and peritendinitis distally, mildly increased from the 06/07/2021 exam. Regional muscular atrophy with low-level accentuated T2 signal along the regional musculature which is probably neuropathic. Soft tissues Mild dorsal subcutaneous edema along the forefoot. IMPRESSION: 1. Interval amputation of the second toe at the level of the mid diaphysis of the proximal phalanx, without evidence of osteomyelitis involving the remaining portion of the proximal phalanx. 2. Stable appearance of substantial erosive arthropathy at the first MTP joint especially involving the first metatarsal head, with associated marrow edema in the metatarsal head and distal shaft. Complex dorsal effusion. The appearance tends to favor gout arthropathy, but as before, infection is not readily excluded. No substantial change over the last 5 days. 3. Stable stress reaction in the fifth metatarsal, with low-level marrow edema. Small erosion or degenerative subcortical cyst in the head of the fifth metatarsal, stable. 4. Increased flexor digitorum longus tenosynovitis and peritendinitis. 5. Mild dorsal subcutaneous edema along the forefoot, cellulitis is not excluded. Electronically Signed   By: Van Clines M.D.   On: 06/12/2021 12:13        Scheduled Meds:  cholecalciferol  5,000 Units Oral Daily   feeding supplement  237 mL Oral BID BM   gabapentin  400 mg Oral  Daily   gabapentin  800 mg Oral QHS   heparin  5,000 Units Subcutaneous Q8H   multivitamin with minerals  1 tablet Oral Daily   pantoprazole (PROTONIX) IV  40 mg Intravenous Q12H   Continuous Infusions:  sodium chloride 100 mL/hr at 06/14/21 0322   ciprofloxacin Stopped (06/13/21 2131)   metronidazole Stopped (06/14/21 0306)     LOS: 2 days    Time spent: 25 minutes    Sidney Ace, MD [facility]   If 7PM-7AM, please contact night-coverage  06/14/2021, 10:20 AM

## 2021-06-14 NOTE — Evaluation (Signed)
Occupational Therapy Evaluation Patient Details Name: Daniel Leon MRN: [id number] DOB: 06-09-1951 Today's Date: 06/14/2021   History of Present Illness 70 y.o. male with medical history significant of hypertension, diet-controlled diabetes, right bundle blockade, peripheral neuropathy, BPH, anemia, s/p of left BKA, CKD, s/p of right second toe amputation (10/16), who presented to [facility] with weakness and nausea. Pt admitted for sepsis secondary to duodenitis   Clinical Impression   Pt seen for OT evaluation this date. At baseline, pt is mod-independent with LLE prosthetic in all ADLs and functional mobility, living in a 1-story home with wife. Pt currently presents with decreased strength and activity tolerance. Pt currently requires MIN GUARD for bed mobility and SUPERVISION/SET-UP for seated UB/LB dressing. Pt attempted sit>stand transfer this date, however was unable to clear hips from seat following MAX A of 1-person, and x2 attempts. With MOD A, pt able to perform lateral scoot at EOB. Pt reported fatigue following lateral scoot toward foot and head of bed. Upon return to supine, male purewick noted to be dislodged and this author assisted pt with change of bed linen; RN informed. Upon discharge, recommend STR to maximize return to PLOF and minimize risk of future falls, injury, caregiver burden, and readmission.     Recommendations for follow up therapy are one component of a multi-disciplinary discharge planning process, led by the attending physician.  Recommendations may be updated based on patient status, additional functional criteria and insurance authorization.   Follow Up Recommendations  SNF    Equipment Recommendations  Other (comment) (defer to next venue of care)       Precautions / Restrictions Precautions Precautions: Fall Restrictions Weight Bearing Restrictions: Yes RLE Weight Bearing: Weight bearing as tolerated Other Position/Activity Restrictions: in post op  shoe      Mobility Bed Mobility Overal bed mobility: Needs Assistance Bed Mobility: Rolling;Supine to Sit;Sit to Supine Rolling: Modified independent (Device/Increase time)   Supine to sit: Modified independent (Device/Increase time) Sit to supine: Min guard;HOB elevated        Transfers Overall transfer level: Needs assistance Equipment used: Rolling walker (2 wheeled) Transfers: Sit to/from Stand   Stand pivot transfers: Max assist;From elevated surface      Lateral/Scoot Transfers: Mod assist;From elevated surface General transfer comment: Unable to clear hips from bed following MAX A of 1-person. MOD A to laterally scoot at EOB    Balance Overall balance assessment: Needs assistance Sitting-balance support: No upper extremity supported;Feet supported Sitting balance-Leahy Scale: Good Sitting balance - Comments: Good sitting balance reaching outside BOS for LB dressing   Standing balance support: Bilateral upper extremity supported;During functional activity Standing balance-Leahy Scale: Zero Standing balance comment: Pt unable to clear hips from seat following MAX A of 1-person                           ADL either performed or assessed with clinical judgement   ADL Overall ADL's : Needs assistance/impaired                 Upper Body Dressing : Supervision/safety;Set up;Bed level Upper Body Dressing Details (indicate cue type and reason): to don/doff hospital gown Lower Body Dressing: Min guard;Set up;Sitting/lateral leans Lower Body Dressing Details (indicate cue type and reason): to don post-op shoe and prosthetic             Functional mobility during ADLs: Maximal assistance        Pertinent Vitals/Pain Pain Assessment: Faces Faces  Pain Scale: Hurts a little bit Pain Location: R knee Pain Descriptors / Indicators: Aching Pain Intervention(s): Limited activity within patient's tolerance;Monitored during session     Hand  Dominance Right   Extremity/Trunk Assessment Upper Extremity Assessment Upper Extremity Assessment: Overall WFL for tasks assessed   Lower Extremity Assessment Lower Extremity Assessment: RLE deficits/detail;LLE deficits/detail RLE Deficits / Details: s/p recent 2nd toe amputation, generalized weakness LLE Deficits / Details: chronic L BKA with prosthetic       Communication Communication Communication: No difficulties   Cognition Arousal/Alertness: Awake/alert Behavior During Therapy: WFL for tasks assessed/performed Overall Cognitive Status: Within Functional Limits for tasks assessed                                                Home Living Family/patient expects to be discharged to:: Private residence Living Arrangements: Spouse/significant other Available Help at Discharge: Family;Available 24 hours/day Type of Home: House Home Access: Level entry     Home Layout: One level     Bathroom Shower/Tub: Producer, television/film/video: Handicapped height Bathroom Accessibility: Yes   Home Equipment: Environmental consultant - 2 wheels;Cane - single point;Bedside commode;Shower seat;Grab bars - toilet;Grab bars - tub/shower   Additional Comments: wife 24.7, son lives nearby      Prior Functioning/Environment Level of Independence: Independent with assistive device(s)        Comments: 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, research (medical) (able to complete seated), drives, MOD I using LLE prosthetic, SPC as needed        OT Problem List: Decreased strength;Decreased activity tolerance;Impaired balance (sitting and/or standing)      OT Treatment/Interventions: Self-care/ADL training;Therapeutic exercise;Energy conservation;DME and/or AE instruction;Therapeutic activities;Patient/family education;Balance training    OT Goals(Current goals can be found in the care plan section) Acute Rehab OT Goals Patient Stated Goal: to feel better and then go home OT Goal Formulation:  With patient Time For Goal Achievement: 06/28/21 Potential to Achieve Goals: Fair ADL Goals Pt Will Perform Grooming: with min guard assist;standing Pt Will Perform Lower Body Dressing: with min assist;sit to/from stand Pt Will Transfer to Toilet: with min assist;stand pivot transfer;bedside commode  OT Frequency: Min 2X/week    AM-PAC OT "6 Clicks" Daily Activity     Outcome Measure Help from another person eating meals?: None Help from another person taking care of personal grooming?: A Little Help from another person toileting, which includes using toliet, bedpan, or urinal?: A Lot Help from another person bathing (including washing, rinsing, drying)?: A Lot Help from another person to put on and taking off regular upper body clothing?: A Little Help from another person to put on and taking off regular lower body clothing?: A Little 6 Click Score: 17   End of Session Equipment Utilized During Treatment: Gait belt;Rolling walker Nurse Communication: Mobility status  Activity Tolerance: Patient tolerated treatment well Patient left: in bed;with call bell/phone within reach;with bed alarm set;with family/visitor present  OT Visit Diagnosis: Other abnormalities of gait and mobility (R26.89);Muscle weakness (generalized) (M62.81)                Time: 6314-9702 OT Time Calculation (min): 50 min Charges:  OT General Charges $OT Visit: 1 Visit OT Evaluation $OT Eval Moderate Complexity: 1 Mod OT Treatments $Self Care/Home Management : 23-37 mins  Matthew Folks, OTR/L [facility] [phone]

## 2021-06-15 ENCOUNTER — Inpatient Hospital Stay (HOSPITAL_COMMUNITY)
Admit: 2021-06-15 | Discharge: 2021-06-15 | Disposition: A | Discharge status: Home / Self Care | Payer: Medicare PPO | Attending: Internal Medicine | Admitting: Internal Medicine

## 2021-06-15 ENCOUNTER — Inpatient Hospital Stay: Payer: Medicare PPO | Admitting: Family Medicine

## 2021-06-15 DIAGNOSIS — R6520 Severe sepsis without septic shock: Secondary | ICD-10-CM | POA: Diagnosis not present

## 2021-06-15 DIAGNOSIS — R9431 Abnormal electrocardiogram [ECG] [EKG]: Secondary | ICD-10-CM | POA: Diagnosis not present

## 2021-06-15 DIAGNOSIS — A419 Sepsis, unspecified organism: Secondary | ICD-10-CM | POA: Diagnosis not present

## 2021-06-15 LAB — BASIC METABOLIC PANEL
Anion gap: 6 (ref 5–15)
BUN: 22 mg/dL (ref 8–23)
CO2: 24 mmol/L (ref 22–32)
Calcium: 8.5 mg/dL — ABNORMAL LOW (ref 8.9–10.3)
Chloride: 103 mmol/L (ref 98–111)
Creatinine, Ser: 1.21 mg/dL (ref 0.61–1.24)
GFR, Estimated: >60 mL/min (ref >60)
Glucose, Bld: 97 mg/dL (ref 70–99)
Potassium: 3.9 mmol/L (ref 3.5–5.1)
Sodium: 133 mmol/L — ABNORMAL LOW (ref 135–145)

## 2021-06-15 LAB — CBC WITH DIFFERENTIAL/PLATELET
Abs Immature Granulocytes: 0.87 10*3/uL — ABNORMAL HIGH (ref 0.00–0.07)
Basophils Absolute: 0.1 10*3/uL (ref 0.0–0.1)
Basophils Relative: 1 %
Eosinophils Absolute: 0.2 10*3/uL (ref 0.0–0.5)
Eosinophils Relative: 2 %
HCT: 29.8 % — ABNORMAL LOW (ref 39.0–52.0)
Hemoglobin: 10.1 g/dL — ABNORMAL LOW (ref 13.0–17.0)
Immature Granulocytes: 9 %
Lymphocytes Relative: 13 %
Lymphs Abs: 1.3 10*3/uL (ref 0.7–4.0)
MCH: 29.9 pg (ref 26.0–34.0)
MCHC: 33.9 g/dL (ref 30.0–36.0)
MCV: 88.2 fL (ref 80.0–100.0)
Monocytes Absolute: 0.9 10*3/uL (ref 0.1–1.0)
Monocytes Relative: 9 %
Neutro Abs: 6.8 10*3/uL (ref 1.7–7.7)
Neutrophils Relative %: 66 %
Platelets: 358 10*3/uL (ref 150–400)
RBC: 3.38 MIL/uL — ABNORMAL LOW (ref 4.22–5.81)
RDW: 14.2 % (ref 11.5–15.5)
Smear Review: NORMAL
WBC: 10.2 10*3/uL (ref 4.0–10.5)
nRBC: 0 % (ref 0.0–0.2)

## 2021-06-15 LAB — ECHOCARDIOGRAM COMPLETE
Height: 76 in
S' Lateral: 3.2 cm
Weight: 3984 oz

## 2021-06-15 LAB — PROCALCITONIN: Procalcitonin: 0.48 ng/mL

## 2021-06-15 MED ORDER — CIPROFLOXACIN HCL 500 MG PO TABS
500.0000 mg | ORAL_TABLET | Freq: Two times a day (BID) | ORAL | Status: AC
  Administered 2021-06-15 – 2021-06-22 (×15): 500 mg via ORAL
  Filled 2021-06-15 (×15): qty 1

## 2021-06-15 MED ORDER — METRONIDAZOLE 500 MG PO TABS
500.0000 mg | ORAL_TABLET | Freq: Three times a day (TID) | ORAL | Status: AC
  Administered 2021-06-15 – 2021-06-22 (×23): 500 mg via ORAL
  Filled 2021-06-15 (×24): qty 1

## 2021-06-15 NOTE — Progress Notes (Signed)
Occupational Therapy Treatment Patient Details Name: Daniel Leon MRN: [id number] DOB: Apr 11, 1951 Today's Date: 06/15/2021   History of present illness 70 y.o. male with medical history significant of hypertension, diet-controlled diabetes, right bundle blockade, peripheral neuropathy, BPH, anemia, s/p of left BKA, CKD, s/p of right second toe amputation (10/16), who presented to [facility] with weakness and nausea. Pt admitted for sepsis secondary to duodenitis   OT comments  Pt seen for OT treatment on this date. Upon arrival to room, pt awake and seated upright in bed with family at bedside. Pt agreeable to OT tx. Pt currently requires SUPERVISION/SET-UP for seated grooming tasks, MIN GUARD to don prosthetic/post op shoe while seated EOB, MOD A for sit<>stand transfers (with bed elevated), and MIN GUARD to walk 6 steps forward/backward, x2. Pt continues to require significant assist for functional transfers and STR remains appropriate to maximize return to PLOF and minimize risk of future falls, injury, caregiver burden, and readmission. Discharge recommendation remains appropriate, however pt may progress to home with [facility] pending continued progress with functional transfers in subsequent sessions. At end of session, pt provided with HEP for strengthening UE/LE in preparation for functional transfers; pt verbalized understanding.    Recommendations for follow up therapy are one component of a multi-disciplinary discharge planning process, led by the attending physician.  Recommendations may be updated based on patient status, additional functional criteria and insurance authorization.    Follow Up Recommendations  Skilled nursing-short term rehab (<3 hours/day)    Assistance Recommended at Discharge Intermittent Supervision/Assistance  Equipment Recommendations  Other (comment) (defer to next venue of care)       Precautions / Restrictions Precautions Precautions: Fall Restrictions Weight  Bearing Restrictions: No RLE Weight Bearing: Weight bearing as tolerated Other Position/Activity Restrictions: in post op shoe       Mobility Bed Mobility Overal bed mobility: Needs Assistance Bed Mobility: Supine to Sit;Sit to Supine     Supine to sit: Min guard          Transfers Overall transfer level: Needs assistance Equipment used: Rolling walker (2 wheels) Transfers: Sit to/from Stand Sit to Stand: Mod assist;From elevated surface           General transfer comment: x2 sit<>stand transfers, with bed elevated prior to standing     Balance Overall balance assessment: Needs assistance Sitting-balance support: No upper extremity supported;Feet supported Sitting balance-Leahy Scale: Good Sitting balance - Comments: Good sitting balance reaching outside BOS for LB dressing   Standing balance support: Bilateral upper extremity supported;During functional activity Standing balance-Leahy Scale: Fair Standing balance comment: MIN GUARD for functional mobility of short distances (6 steps forward/backwards) with RW                           ADL either performed or assessed with clinical judgement   ADL Overall ADL's : Needs assistance/impaired     Grooming: Wash/dry face;Set up;Sitting               Lower Body Dressing: Min guard;Set up;Sitting/lateral leans Lower Body Dressing Details (indicate cue type and reason): to don post-op shoe and prosthetic             Functional mobility during ADLs: Min guard;Rolling walker (2 wheels)        Cognition Arousal/Alertness: Awake/alert Behavior During Therapy: WFL for tasks assessed/performed Overall Cognitive Status: Within Functional Limits for tasks assessed  Pertinent Vitals/ Pain       Pain Assessment: No/denies pain         Frequency  Min 2X/week        Progress Toward Goals  OT Goals(current goals  can now be found in the care plan section)  Progress towards OT goals: Progressing toward goals  Acute Rehab OT Goals OT Goal Formulation: With patient Time For Goal Achievement: 06/28/21 Potential to Achieve Goals: Fair  Plan Discharge plan remains appropriate;Frequency remains appropriate       AM-PAC OT "6 Clicks" Daily Activity     Outcome Measure   Help from another person eating meals?: None Help from another person taking care of personal grooming?: A Little Help from another person toileting, which includes using toliet, bedpan, or urinal?: A Lot Help from another person bathing (including washing, rinsing, drying)?: A Lot Help from another person to put on and taking off regular upper body clothing?: None Help from another person to put on and taking off regular lower body clothing?: A Little 6 Click Score: 18    End of Session Equipment Utilized During Treatment: Gait belt;Rolling walker (2 wheels)  OT Visit Diagnosis: Other abnormalities of gait and mobility (R26.89);Muscle weakness (generalized) (M62.81)   Activity Tolerance Patient tolerated treatment well   Patient Left in bed;with call bell/phone within reach;with family/visitor present;with nursing/sitter in room   Nurse Communication Mobility status        Time: 1451-1520 OT Time Calculation (min): 29 min  Charges: OT General Charges $OT Visit: 1 Visit OT Treatments $Self Care/Home Management : 8-22 mins $Therapeutic Activity: 8-22 mins  Matthew Folks, OTR/L [facility] [phone]

## 2021-06-15 NOTE — Progress Notes (Signed)
PHARMACIST - PHYSICIAN COMMUNICATION  DR:   Georgeann Oppenheim  CONCERNING: Antibiotic IV to Oral Route Change Policy  RECOMMENDATION:  This patient is receiving ciprofloxacin and metronidazole by the intravenous route.  Based on criteria approved by the Pharmacy and Therapeutics Committee, the antibiotic(s) is/are being converted to the equivalent oral dose form(s).   DESCRIPTION: These criteria include: Patient being treated for a respiratory tract infection, urinary tract infection, cellulitis or clostridium difficile associated diarrhea if on metronidazole The patient is not neutropenic and does not exhibit a GI malabsorption state The patient is eating (either orally or via tube) and/or has been taking other orally administered medications for a least 24 hours The patient is improving clinically and has a Tmax < 100.5  If you have questions about this conversion, please contact the Pharmacy Department

## 2021-06-15 NOTE — Progress Notes (Signed)
PROGRESS NOTE    Daniel Leon  CNO:[id number] DOB: 26-Aug-1950 DOA: 06/12/2021 PCP: Valerie Roys, DO    Brief Narrative:  70 y.o. male with medical history significant of hypertension, diet-controlled diabetes, right bundle blockade, peripheral neuropathy, BPH, anemia, s/p of left BKA, CKD-3A, s/p of right second toe amputation due to to osteomyelitis, who presents with weakness and nausea.   Patient has history of right second toe osteomyelitis.  Patient is s/p of right second toe amputation on 06/07/2021.  Patient was initially treated with doxycycline.  He states that he has not been feeling well in the past several days.  Patient has severe generalized weakness, poor appetite, decreased oral intake.  He has nausea and dry heaves, no diarrhea or abdominal pain.  No fever or chills.  Patient has mild dry cough, no chest pain or shortness of breath.  Denies symptoms of UTI. He states that he does not have worsening pain in in the surgical site.  No active drainage noted.  Patient was seen by his podiatrist yesterday, who thought that his wound looks okay.  Patient was started on Bactrim.  On admission patient was seen in consultation by podiatry who did not feel that the toe was the source of sepsis.  No surgical intervention warranted.  CT imaging survey concerning for duodenitis.  Patient does report subjective improvement since initiation of antibiotics and IV fluids.  Still significantly weak and deconditioned   Assessment & Plan:   Principal Problem:   Severe sepsis ([place]) Active Problems:   Essential hypertension   Normocytic anemia   UTI (urinary tract infection)   Cellulitis of second toe of right foot   CKD (chronic kidney disease), stage IIIa   Duodenitis   Elevated troponin   Abnormal LFTs  Duodenitis Right lower extremity cellulitis Possible urinary tract infection Severe sepsis secondary to above Criteria met with leukocytosis, tachycardia, tachypnea, elevated  lactic acid Initial hypotension resolved with IV fluid resuscitation Suspect that duodenitis is primary driver for infection No evidence of osteomyelitis No surgical intervention from podiatry standpoint Plan: Continue Cipro plus Flagyl Discontinue IVF continue nutritional supplements As needed pain control As needed antiemetics Follow cultures, no growth to date Monitor vitals and fever curve Tentative discharge 10/25  Essential hypertension Continue home lisinopril Continue as needed IV hydralazine  Normocytic anemia Suspect anemia of chronic kidney disease Hemoglobin 11.8, stable    CKD (chronic kidney disease), stage IIIa::  Renal function close to baseline.  Recent baseline creatinine 1.15-1.39, his creatinine is 1.36, BUN 26.   Abnormal liver function Patient has some mild abnormal liver function, likely due to sepsis. -Monitor   Elevated troponin:  Troponin level 60, 69, no chest pain, likely due to demand ischemia. -Check echocardiogram     DVT prophylaxis: SQ heparin Code Status: Full Family Communication: None today Disposition Plan: Status is: Inpatient  Remains inpatient appropriate because: Severe sepsis secondary to likely duodenitis.  Sepsis physiology improving or minimal.  Anticipated date of discharge 10/25       Level of care: Med-Surg  Consultants:  Podiatry  Procedures:  None  Antimicrobials: Ciprofloxacin Metronidazole   Subjective: Patient seen and examined.  Clinically improving.  Appetite picking up.  No pain complaints  Objective: Vitals:   06/14/21 0750 06/14/21 1702 06/14/21 1953 06/15/21 0450  BP: 137/87 (!) 150/90 (!) 143/81 126/76  Pulse: 99 98 (!) 102 87  Resp: '14 14 20 20  ' Temp: 98.3 F (36.8 C) 97.8 F (36.6 C) 99.4 F (37.4  C) 98.6 F (37 C)  TempSrc: Oral Oral Oral Oral  SpO2: 91% 92% 93% 95%  Weight:      Height:        Intake/Output Summary (Last 24 hours) at 06/15/2021 1200 Last data filed at  06/15/2021 1016 Gross per 24 hour  Intake 240 ml  Output 3000 ml  Net -2760 ml   Filed Weights   06/12/21 0121  Weight: 112.9 kg    Examination:  General exam: No apparent distress.  Appears fatigued Respiratory system: Lungs clear.  Normal work of breathing.  Room air Cardiovascular system: S1-S2, RRR, no murmurs Gastrointestinal system: Obese, soft, NT/ND, positive bowel sounds Central nervous system: Alert and oriented. No focal neurological deficits. Extremities: Status post left BKA status post right second toe amputation Skin: No rashes, lesions or ulcers Psychiatry: Judgement and insight appear normal. Mood & affect appropriate.     Data Reviewed: I have personally reviewed following labs and imaging studies  CBC: Recent Labs  Lab 06/11/21 1507 06/12/21 0120 06/13/21 0503 06/15/21 0427  WBC 16.1* 24.2* 16.3* 10.2  NEUTROABS 12.9* 22.8*  --  6.8  HGB 13.0 11.8* 9.7* 10.1*  HCT 36.6* 33.5* 29.0* 29.8*  MCV 84 86.6 87.6 88.2  PLT 491* 327 286 709   Basic Metabolic Panel: Recent Labs  Lab 06/12/21 0120 06/13/21 0503 06/15/21 0427  NA 130* 131* 133*  K 3.9 4.1 3.9  CL 96* 103 103  CO2 '22 23 24  ' GLUCOSE 153* 97 97  BUN 26* 29* 22  CREATININE 1.36* 1.38* 1.21  CALCIUM 8.5* 7.8* 8.5*   GFR: Estimated Creatinine Clearance: 77 mL/min (by C-G formula based on SCr of 1.21 mg/dL). Liver Function Tests: Recent Labs  Lab 06/12/21 0120  AST 50*  ALT 72*  ALKPHOS 156*  BILITOT 1.1  PROT 7.0  ALBUMIN 2.9*   No results for input(s): LIPASE, AMYLASE in the last 168 hours. No results for input(s): AMMONIA in the last 168 hours. Coagulation Profile: Recent Labs  Lab 06/12/21 1556  INR 1.3*   Cardiac Enzymes: No results for input(s): CKTOTAL, CKMB, CKMBINDEX, TROPONINI in the last 168 hours. BNP (last 3 results) No results for input(s): PROBNP in the last 8760 hours. HbA1C: No results for input(s): HGBA1C in the last 72 hours. CBG: No results for  input(s): GLUCAP in the last 168 hours.  Lipid Profile: Recent Labs    06/13/21 0503  CHOL 128  HDL 22*  LDLCALC 84  TRIG 108  CHOLHDL 5.8   Thyroid Function Tests: No results for input(s): TSH, T4TOTAL, FREET4, T3FREE, THYROIDAB in the last 72 hours. Anemia Panel: No results for input(s): VITAMINB12, FOLATE, FERRITIN, TIBC, IRON, RETICCTPCT in the last 72 hours. Sepsis Labs: Recent Labs  Lab 06/12/21 0120 06/12/21 0338 06/12/21 1556 06/15/21 0427  PROCALCITON  --   --  4.63 0.48  LATICACIDVEN 2.4* 1.8  --   --     Recent Results (from the past 240 hour(s))  Culture, blood (Routine X 2) w Reflex to ID Panel     Status: None   Collection Time: 06/07/21  3:01 AM   Specimen: Right Antecubital; Blood  Result Value Ref Range Status   Specimen Description RIGHT ANTECUBITAL  Final   Special Requests   Final    BOTTLES DRAWN AEROBIC AND ANAEROBIC Blood Culture results may not be optimal due to an excessive volume of blood received in culture bottles   Culture   Final    NO GROWTH 5  DAYS Performed at [facility], [address] [place], [place] [address]    Report Status 06/12/2021 FINAL  Final  Resp Panel by RT-PCR (Flu A&B, Covid) Nasopharyngeal Swab     Status: None   Collection Time: 06/07/21  3:02 AM   Specimen: Nasopharyngeal Swab; Nasopharyngeal(NP) swabs in vial transport medium  Result Value Ref Range Status   SARS Coronavirus 2 by RT PCR NEGATIVE NEGATIVE Final    Comment: (NOTE) SARS-CoV-2 target nucleic acids are NOT DETECTED.  The SARS-CoV-2 RNA is generally detectable in upper respiratory specimens during the acute phase of infection. The lowest concentration of SARS-CoV-2 viral copies this assay can detect is 138 copies/mL. A negative result does not preclude SARS-Cov-2 infection and should not be used as the sole basis for treatment or other patient management decisions. A negative result may occur with  improper specimen collection/handling,  submission of specimen other than nasopharyngeal swab, presence of viral mutation(s) within the areas targeted by this assay, and inadequate number of viral copies(<138 copies/mL). A negative result must be combined with clinical observations, patient history, and epidemiological information. The expected result is Negative.  Fact Sheet for Patients:  EntrepreneurPulse.com.au  Fact Sheet for Healthcare Providers:  IncredibleEmployment.be  This test is no t yet approved or cleared by the [place] FDA and  has been authorized for detection and/or diagnosis of SARS-CoV-2 by FDA under an Emergency Use Authorization (EUA). This EUA will remain  in effect (meaning this test can be used) for the duration of the COVID-19 declaration under Section 564(b)(1) of the Act, 21 U.S.C.section 360bbb-3(b)(1), unless the authorization is terminated  or revoked sooner.       Influenza A by PCR NEGATIVE NEGATIVE Final   Influenza B by PCR NEGATIVE NEGATIVE Final    Comment: (NOTE) The Xpert Xpress SARS-CoV-2/FLU/RSV plus assay is intended as an aid in the diagnosis of influenza from Nasopharyngeal swab specimens and should not be used as a sole basis for treatment. Nasal washings and aspirates are unacceptable for Xpert Xpress SARS-CoV-2/FLU/RSV testing.  Fact Sheet for Patients: EntrepreneurPulse.com.au  Fact Sheet for Healthcare Providers: IncredibleEmployment.be  This test is not yet approved or cleared by the [place] FDA and has been authorized for detection and/or diagnosis of SARS-CoV-2 by FDA under an Emergency Use Authorization (EUA). This EUA will remain in effect (meaning this test can be used) for the duration of the COVID-19 declaration under Section 564(b)(1) of the Act, 21 U.S.C. section 360bbb-3(b)(1), unless the authorization is terminated or revoked.  Performed at [facility], [address] [place], [place] [address]   Culture, blood (Routine X 2) w Reflex to ID Panel     Status: None   Collection Time: 06/07/21  3:06 AM   Specimen: BLOOD LEFT FOREARM  Result Value Ref Range Status   Specimen Description BLOOD LEFT FOREARM  Final   Special Requests   Final    BOTTLES DRAWN AEROBIC AND ANAEROBIC Blood Culture adequate volume   Culture   Final    NO GROWTH 5 DAYS Performed at [facility], [address] [place], [place] [address]    Report Status 06/12/2021 FINAL  Final  Urine Culture     Status: None   Collection Time: 06/07/21  3:13 AM   Specimen: Urine, Clean Catch  Result Value Ref Range Status   Specimen Description   Final    URINE, CLEAN CATCH Performed at [facility], [address] [place], [place]  [address]    Special Requests   Final    NONE Performed at [facility], [address] [place], [place] [address]    Culture   Final    NO GROWTH Performed at [facility], [place] [address], [place], [place] [address]    Report Status 06/08/2021 FINAL  Final  Microscopic Examination     Status: Abnormal   Collection Time: 06/11/21  3:07 PM   BLD  Result Value Ref Range Status   WBC, UA None seen 0 - 5 /hpf Final   RBC 0-2 0 - 2 /hpf Final   Epithelial Cells (non renal) None seen 0 - 10 /hpf Final   Casts Present (A) None seen /lpf Final   Cast Type Hyaline casts N/A Final   Crystals Present (A) N/A Final   Crystal Type Uric Acid N/A Final   Mucus, UA Present (A) Not Estab. Final   Bacteria, UA None seen None seen/Few Final  Urine Culture     Status: None   Collection Time: 06/11/21  3:53 PM   Specimen: Urine   UR  Result Value Ref Range Status   Urine Culture, Routine Final report  Final   Organism ID, Bacteria No growth  Final  Blood culture (routine x 2)     Status: None (Preliminary result)   Collection Time: 06/12/21  1:27 AM   Specimen: BLOOD  Result Value Ref Range Status   Specimen  Description BLOOD LEFT FA  Final   Special Requests   Final    BOTTLES DRAWN AEROBIC AND ANAEROBIC Blood Culture adequate volume   Culture   Final    NO GROWTH 3 DAYS Performed at [facility], [address], [place], [place] [address]    Report Status PENDING  Incomplete  Blood culture (routine x 2)     Status: None (Preliminary result)   Collection Time: 06/12/21  1:27 AM   Specimen: BLOOD  Result Value Ref Range Status   Specimen Description BLOOD LEFT HAND  Final   Special Requests   Final    BOTTLES DRAWN AEROBIC AND ANAEROBIC Blood Culture adequate volume   Culture   Final    NO GROWTH 3 DAYS Performed at [facility], [address] [place], [place] [address]    Report Status PENDING  Incomplete  Resp Panel by RT-PCR (Flu A&B, Covid) Nasopharyngeal Swab     Status: None   Collection Time: 06/12/21  1:29 AM   Specimen: Nasopharyngeal Swab; Nasopharyngeal(NP) swabs in vial transport medium  Result Value Ref Range Status   SARS Coronavirus 2 by RT PCR NEGATIVE NEGATIVE Final    Comment: (NOTE) SARS-CoV-2 target nucleic acids are NOT DETECTED.  The SARS-CoV-2 RNA is generally detectable in upper respiratory specimens during the acute phase of infection. The lowest concentration of SARS-CoV-2 viral copies this assay can detect is 138 copies/mL. A negative result does not preclude SARS-Cov-2 infection and should not be used as the sole basis for treatment or other patient management decisions. A negative result may occur with  improper specimen collection/handling, submission of specimen other than nasopharyngeal swab, presence of viral mutation(s) within the areas targeted by this assay, and inadequate number of viral copies(<138 copies/mL). A negative result must be combined with clinical observations, patient history, and epidemiological information. The expected result is Negative.  Fact Sheet for Patients:   EntrepreneurPulse.com.au  Fact Sheet for Healthcare Providers:  IncredibleEmployment.be  This test is no t yet approved  or cleared by the [place] and  has been authorized for detection and/or diagnosis of SARS-CoV-2 by FDA under an Emergency Use Authorization (EUA). This EUA will remain  in effect (meaning this test can be used) for the duration of the COVID-19 declaration under Section 564(b)(1) of the Act, 21 U.S.C.section 360bbb-3(b)(1), unless the authorization is terminated  or revoked sooner.       Influenza A by PCR NEGATIVE NEGATIVE Final   Influenza B by PCR NEGATIVE NEGATIVE Final    Comment: (NOTE) The Xpert Xpress SARS-CoV-2/FLU/RSV plus assay is intended as an aid in the diagnosis of influenza from Nasopharyngeal swab specimens and should not be used as a sole basis for treatment. Nasal washings and aspirates are unacceptable for Xpert Xpress SARS-CoV-2/FLU/RSV testing.  Fact Sheet for Patients: EntrepreneurPulse.com.au  Fact Sheet for Healthcare Providers: IncredibleEmployment.be  This test is not yet approved or cleared by the [place] FDA and has been authorized for detection and/or diagnosis of SARS-CoV-2 by FDA under an Emergency Use Authorization (EUA). This EUA will remain in effect (meaning this test can be used) for the duration of the COVID-19 declaration under Section 564(b)(1) of the Act, 21 U.S.C. section 360bbb-3(b)(1), unless the authorization is terminated or revoked.  Performed at [facility], [address], [place], [place] [address]   Urine Culture     Status: None   Collection Time: 06/12/21  3:36 AM   Specimen: Urine, Random  Result Value Ref Range Status   Specimen Description   Final    URINE, RANDOM Performed at [facility], [address], [place], [place] [address]    Special Requests   Final    NONE Performed at  [facility], [address] [place], [place] [address]    Culture   Final    NO GROWTH Performed at [facility], [place] [address], [place],  [address]    Report Status 06/13/2021 FINAL  Final         Radiology Studies: No results found.      Scheduled Meds:  cholecalciferol  5,000 Units Oral Daily   feeding supplement  237 mL Oral BID BM   gabapentin  400 mg Oral Daily   gabapentin  800 mg Oral QHS   heparin  5,000 Units Subcutaneous Q8H   lisinopril  15 mg Oral Daily   multivitamin with minerals  1 tablet Oral Daily   pantoprazole (PROTONIX) IV  40 mg Intravenous Q12H   Continuous Infusions:  ciprofloxacin 400 mg (06/15/21 0914)   metronidazole 500 mg (06/15/21 0747)     LOS: 3 days    Time spent: 25 minutes    Sidney Ace, MD [facility]   If 7PM-7AM, please contact night-coverage  06/15/2021, 12:00 PM

## 2021-06-15 NOTE — Evaluation (Signed)
Physical Therapy Evaluation Patient Details Name: Daniel Leon MRN: [id number] DOB: Aug 02, 1951 Today's Date: 06/15/2021  History of Present Illness  70 y.o. male with medical history significant of hypertension, diet-controlled diabetes, right bundle blockade, peripheral neuropathy, BPH, anemia, s/p of left BKA, CKD, s/p of right second toe amputation (10/16), who presented to [facility] with weakness and nausea. Pt admitted for sepsis secondary to duodenitis  Clinical Impression  Pt is a pleasant 70 year old male who was admitted for severe sepsis with complaints of nausea and weakness. Original 2nd toe amputation on 10/16-currently still using post op shoe. Pt performs bed mobility with cga, transfers with min assist, and ambulation with cga and RW at bedside. Pt demonstrates deficits with strength/mobility/endurance. Pt is very hopeful he will be able to transition to home, but is agreeable and open to SNF recommendation. Would benefit from skilled PT to address above deficits and promote optimal return to PLOF; recommend transition to STR upon discharge from acute hospitalization.       Recommendations for follow up therapy are one component of a multi-disciplinary discharge planning process, led by the attending physician.  Recommendations may be updated based on patient status, additional functional criteria and insurance authorization.  Follow Up Recommendations Skilled nursing-short term rehab (<3 hours/day)    Assistance Recommended at Discharge None  Functional Status Assessment Patient has had a recent decline in their functional status and demonstrates the ability to make significant improvements in function in a reasonable and predictable amount of time.  Equipment Recommendations       Recommendations for Other Services       Precautions / Restrictions Precautions Precautions: Fall Restrictions Weight Bearing Restrictions: Yes RLE Weight Bearing: Weight bearing as  tolerated Other Position/Activity Restrictions: in post op shoe      Mobility  Bed Mobility Overal bed mobility: Needs Assistance Bed Mobility: Supine to Sit     Supine to sit: Min guard     General bed mobility comments: safe technique. Once seated, upright posture noted and reports he feels really happy out of the bed. Requested to be left sitting at EOB at end of treatment    Transfers Overall transfer level: Needs assistance Equipment used: Rolling walker (2 wheels) Transfers: Sit to/from Stand Sit to Stand: Min assist           General transfer comment: multiple sit<>Stands requiring bed to be very elevated prior to standing. RW used.    Ambulation/Gait Ambulation/Gait assistance: Min guard Gait Distance (Feet): 3 Feet Assistive device: Rolling walker (2 wheels) Gait Pattern/deviations: Step-to pattern     General Gait Details: pre gait activities performed including marching in place and then able to take side steps up towards HOB. Very cautious with unsteadiness noted.  Stairs            Wheelchair Mobility    Modified Rankin (Stroke Patients Only)       Balance Overall balance assessment: Needs assistance Sitting-balance support: No upper extremity supported;Feet supported Sitting balance-Leahy Scale: Good     Standing balance support: Bilateral upper extremity supported;During functional activity Standing balance-Leahy Scale: Fair                               Pertinent Vitals/Pain Pain Assessment: No/denies pain    Home Living Family/patient expects to be discharged to:: Private residence Living Arrangements: Spouse/significant other Available Help at Discharge: Family;Available 24 hours/day Type of Home: House Home Access:  Level entry       Home Layout: One level Home Equipment: Agricultural consultant (2 wheels);BSC;Cane - single point;Grab bars - toilet;Grab bars - tub/shower Additional Comments: wife 24.7, son lives  nearby- reports plans to buy a lift chair    Prior Function Prior Level of Function : Independent/Modified Independent             Mobility Comments: was previously indep-using SPC if needed       Hand Dominance        Extremity/Trunk Assessment   Upper Extremity Assessment Upper Extremity Assessment: Generalized weakness (R UE grossly 4/5 with slight pain in elbow; L UE grossly 5/5)    Lower Extremity Assessment Lower Extremity Assessment: Generalized weakness RLE Deficits / Details: 4/5 LLE Deficits / Details: chronic L BKA with prosthetic       Communication   Communication: No difficulties  Cognition Arousal/Alertness: Awake/alert Behavior During Therapy: WFL for tasks assessed/performed Overall Cognitive Status: Within Functional Limits for tasks assessed                                          General Comments      Exercises Other Exercises Other Exercises: multiple sit<>Stands with RW and requires RW to be braced. Also assisted with donning prosthetic and post op shoe   Assessment/Plan    PT Assessment Patient needs continued PT services  PT Problem List Decreased strength;Decreased balance;Decreased mobility       PT Treatment Interventions Gait training;DME instruction;Therapeutic exercise    PT Goals (Current goals can be found in the Care Plan section)  Acute Rehab PT Goals Patient Stated Goal: to feel better and then go home PT Goal Formulation: With patient Time For Goal Achievement: 06/29/21 Potential to Achieve Goals: Good    Frequency Min 2X/week   Barriers to discharge        Co-evaluation               AM-PAC PT "6 Clicks" Mobility  Outcome Measure Help needed turning from your back to your side while in a flat bed without using bedrails?: A Little Help needed moving from lying on your back to sitting on the side of a flat bed without using bedrails?: A Little Help needed moving to and from a bed to a  chair (including a wheelchair)?: A Little Help needed standing up from a chair using your arms (e.g., wheelchair or bedside chair)?: A Little Help needed to walk in hospital room?: A Lot Help needed climbing 3-5 steps with a railing? : A Lot 6 Click Score: 16    End of Session Equipment Utilized During Treatment: Gait belt Activity Tolerance: Patient tolerated treatment well Patient left: in bed;with bed alarm set (seated EOB) Nurse Communication: Mobility status PT Visit Diagnosis: Unsteadiness on feet (R26.81);Muscle weakness (generalized) (M62.81);Difficulty in walking, not elsewhere classified (R26.2)    Time: 1660-6301 PT Time Calculation (min) (ACUTE ONLY): 27 min   Charges:   PT Evaluation $PT Eval Low Complexity: 1 Low PT Treatments $Therapeutic Activity: 8-22 mins        Elizabeth Palau, PT, DPT [phone]   Mikia Delaluz 06/15/2021, 12:53 PM

## 2021-06-15 NOTE — Progress Notes (Signed)
*  PRELIMINARY RESULTS* Echocardiogram 2D Echocardiogram has been performed.  Cristela Blue 06/15/2021, 8:29 AM

## 2021-06-15 NOTE — Telephone Encounter (Signed)
Please advise 

## 2021-06-15 NOTE — Care Management Important Message (Signed)
Important Message  Patient Details  Name: Daniel Leon MRN: [id number] Date of Birth: 08-24-1950   Medicare Important Message Given:  Yes     [name] 06/15/2021, 2:32 PM

## 2021-06-16 DIAGNOSIS — R6520 Severe sepsis without septic shock: Secondary | ICD-10-CM | POA: Diagnosis not present

## 2021-06-16 DIAGNOSIS — A419 Sepsis, unspecified organism: Secondary | ICD-10-CM | POA: Diagnosis not present

## 2021-06-16 LAB — CBC WITH DIFFERENTIAL/PLATELET
Abs Immature Granulocytes: 1.39 10*3/uL — ABNORMAL HIGH (ref 0.00–0.07)
Basophils Absolute: 0.1 10*3/uL (ref 0.0–0.1)
Basophils Relative: 1 %
Eosinophils Absolute: 0.3 10*3/uL (ref 0.0–0.5)
Eosinophils Relative: 3 %
HCT: 31.5 % — ABNORMAL LOW (ref 39.0–52.0)
Hemoglobin: 10.9 g/dL — ABNORMAL LOW (ref 13.0–17.0)
Immature Granulocytes: 13 %
Lymphocytes Relative: 17 %
Lymphs Abs: 1.8 10*3/uL (ref 0.7–4.0)
MCH: 30.4 pg (ref 26.0–34.0)
MCHC: 34.6 g/dL (ref 30.0–36.0)
MCV: 87.7 fL (ref 80.0–100.0)
Monocytes Absolute: 1.1 10*3/uL — ABNORMAL HIGH (ref 0.1–1.0)
Monocytes Relative: 10 %
Neutro Abs: 5.8 10*3/uL (ref 1.7–7.7)
Neutrophils Relative %: 56 %
Platelets: 402 10*3/uL — ABNORMAL HIGH (ref 150–400)
RBC: 3.59 MIL/uL — ABNORMAL LOW (ref 4.22–5.81)
RDW: 14 % (ref 11.5–15.5)
Smear Review: NORMAL
WBC: 10.4 10*3/uL (ref 4.0–10.5)
nRBC: 0.2 % (ref 0.0–0.2)

## 2021-06-16 LAB — BASIC METABOLIC PANEL
Anion gap: 11 (ref 5–15)
BUN: 23 mg/dL (ref 8–23)
CO2: 24 mmol/L (ref 22–32)
Calcium: 9 mg/dL (ref 8.9–10.3)
Chloride: 99 mmol/L (ref 98–111)
Creatinine, Ser: 1.11 mg/dL (ref 0.61–1.24)
GFR, Estimated: >60 mL/min (ref >60)
Glucose, Bld: 113 mg/dL — ABNORMAL HIGH (ref 70–99)
Potassium: 3.9 mmol/L (ref 3.5–5.1)
Sodium: 134 mmol/L — ABNORMAL LOW (ref 135–145)

## 2021-06-16 MED ORDER — PANTOPRAZOLE SODIUM 40 MG PO TBEC
40.0000 mg | DELAYED_RELEASE_TABLET | Freq: Every day | ORAL | Status: DC
  Administered 2021-06-17 – 2021-06-23 (×7): 40 mg via ORAL
  Filled 2021-06-16 (×7): qty 1

## 2021-06-16 NOTE — Progress Notes (Signed)
PROGRESS NOTE    Daniel Leon  XBM:[id number] DOB: [birth year] DOA: 06/12/2021 PCP: Valerie Roys, DO    Brief Narrative:  70 y.o. male with medical history significant of hypertension, diet-controlled diabetes, right bundle blockade, peripheral neuropathy, BPH, anemia, s/p of left BKA, CKD-3A, s/p of right second toe amputation due to to osteomyelitis, who presents with weakness and nausea.   Patient has history of right second toe osteomyelitis.  Patient is s/p of right second toe amputation on 06/07/2021.  Patient was initially treated with doxycycline.  He states that he has not been feeling well in the past several days.  Patient has severe generalized weakness, poor appetite, decreased oral intake.  He has nausea and dry heaves, no diarrhea or abdominal pain.  No fever or chills.  Patient has mild dry cough, no chest pain or shortness of breath.  Denies symptoms of UTI. He states that he does not have worsening pain in in the surgical site.  No active drainage noted.  Patient was seen by his podiatrist yesterday, who thought that his wound looks okay.  Patient was started on Bactrim.  On admission patient was seen in consultation by podiatry who did not feel that the toe was the source of sepsis.  No surgical intervention warranted.  CT imaging survey concerning for duodenitis.  Patient does report subjective improvement since initiation of antibiotics and IV fluids.  Still significantly weak and deconditioned.  Therapy is made recommendation for skilled nursing facility.  Patient is somewhat reluctant.  He is improving clinically.   Assessment & Plan:   Principal Problem:   Severe sepsis ([place]) Active Problems:   Essential hypertension   Normocytic anemia   UTI (urinary tract infection)   Cellulitis of second toe of right foot   CKD (chronic kidney disease), stage IIIa   Duodenitis   Elevated troponin   Abnormal LFTs  Duodenitis Right lower extremity cellulitis Possible  urinary tract infection Severe sepsis secondary to above Criteria met with leukocytosis, tachycardia, tachypnea, elevated lactic acid Initial hypotension resolved with IV fluid resuscitation Suspect that duodenitis is primary driver for infection No evidence of osteomyelitis No surgical intervention from podiatry standpoint Plan: Continue Cipro plus Flagyl Discontinue IV fluids tentative plan discharge 10/26 continue nutritional supplements As needed pain control As needed antiemetics Follow cultures, no growth to date Monitor vitals and fever curve Tentative discharge 10/25  Essential hypertension Continue home lisinopril Continue as needed IV hydralazine  Normocytic anemia Suspect anemia of chronic kidney disease Hemoglobin 11.8, stable    CKD (chronic kidney disease), stage IIIa::  Renal function close to baseline.  Recent baseline creatinine 1.15-1.39, his creatinine is 1.36, BUN 26.   Abnormal liver function Patient has some mild abnormal liver function, likely due to sepsis. -Monitor   Elevated troponin:  Troponin level 60, 69, no chest pain, likely due to demand ischemia. -Echocardiogram reassuring.  Normal EF.  No valvulopathy     DVT prophylaxis: SQ heparin Code Status: Full Family Communication: Spouse Chrishaun Sasso [phone] on 10/25 Disposition Plan: Status is: Inpatient  Remains inpatient appropriate because: Severe sepsis secondary to likely duodenitis.  Sepsis physiology resolved.  Anticipated date of discharge 10/26.  Will need repeat evaluations by therapy prior to disposition planning.       Level of care: Med-Surg  Consultants:  Podiatry  Procedures:  None  Antimicrobials: Ciprofloxacin Metronidazole   Subjective: Patient seen and examined.  Clinically improved.  Appetite returned to baseline.  No pain complaints  Objective: Vitals:  06/15/21 1531 06/15/21 1954 06/16/21 0503 06/16/21 0800  BP: 127/89 (!) 154/86 (!)  153/86 140/82  Pulse: (!) 105 94 87 92  Resp: '16 17 17 18  ' Temp: 98.4 F (36.9 C)  97.6 F (36.4 C) 98.3 F (36.8 C)  TempSrc: Oral  Oral Oral  SpO2: 95% 91% 92% 92%  Weight:      Height:        Intake/Output Summary (Last 24 hours) at 06/16/2021 1040 Last data filed at 06/16/2021 0500 Gross per 24 hour  Intake 556.76 ml  Output 1375 ml  Net -818.24 ml   Filed Weights   06/12/21 0121  Weight: 112.9 kg    Examination:  General exam: No acute distress.  Appears comfortable Respiratory system: Lungs clear.  Normal work of breathing.  Room air Cardiovascular system: S1-S2, RRR, no murmurs Gastrointestinal system: Obese, soft, NT/ND, normal bowel sounds Central nervous system: Alert and oriented. No focal neurological deficits. Extremities: Status post left BKA status post right second toe amputation Skin: No rashes, lesions or ulcers Psychiatry: Judgement and insight appear normal. Mood & affect appropriate.     Data Reviewed: I have personally reviewed following labs and imaging studies  CBC: Recent Labs  Lab 06/11/21 1507 06/12/21 0120 06/13/21 0503 06/15/21 0427 06/16/21 0502  WBC 16.1* 24.2* 16.3* 10.2 10.4  NEUTROABS 12.9* 22.8*  --  6.8 5.8  HGB 13.0 11.8* 9.7* 10.1* 10.9*  HCT 36.6* 33.5* 29.0* 29.8* 31.5*  MCV 84 86.6 87.6 88.2 87.7  PLT 491* 327 286 358 063*   Basic Metabolic Panel: Recent Labs  Lab 06/12/21 0120 06/13/21 0503 06/15/21 0427 06/16/21 0502  NA 130* 131* 133* 134*  K 3.9 4.1 3.9 3.9  CL 96* 103 103 99  CO2 '22 23 24 24  ' GLUCOSE 153* 97 97 113*  BUN 26* 29* 22 23  CREATININE 1.36* 1.38* 1.21 1.11  CALCIUM 8.5* 7.8* 8.5* 9.0   GFR: Estimated Creatinine Clearance: 83.9 mL/min (by C-G formula based on SCr of 1.11 mg/dL). Liver Function Tests: Recent Labs  Lab 06/12/21 0120  AST 50*  ALT 72*  ALKPHOS 156*  BILITOT 1.1  PROT 7.0  ALBUMIN 2.9*   No results for input(s): LIPASE, AMYLASE in the last 168 hours. No results  for input(s): AMMONIA in the last 168 hours. Coagulation Profile: Recent Labs  Lab 06/12/21 1556  INR 1.3*   Cardiac Enzymes: No results for input(s): CKTOTAL, CKMB, CKMBINDEX, TROPONINI in the last 168 hours. BNP (last 3 results) No results for input(s): PROBNP in the last 8760 hours. HbA1C: No results for input(s): HGBA1C in the last 72 hours. CBG: No results for input(s): GLUCAP in the last 168 hours.  Lipid Profile: No results for input(s): CHOL, HDL, LDLCALC, TRIG, CHOLHDL, LDLDIRECT in the last 72 hours.  Thyroid Function Tests: No results for input(s): TSH, T4TOTAL, FREET4, T3FREE, THYROIDAB in the last 72 hours. Anemia Panel: No results for input(s): VITAMINB12, FOLATE, FERRITIN, TIBC, IRON, RETICCTPCT in the last 72 hours. Sepsis Labs: Recent Labs  Lab 06/12/21 0120 06/12/21 0338 06/12/21 1556 06/15/21 0427  PROCALCITON  --   --  4.63 0.48  LATICACIDVEN 2.4* 1.8  --   --     Recent Results (from the past 240 hour(s))  Culture, blood (Routine X 2) w Reflex to ID Panel     Status: None   Collection Time: 06/07/21  3:01 AM   Specimen: Right Antecubital; Blood  Result Value Ref Range Status   Specimen Description  RIGHT ANTECUBITAL  Final   Special Requests   Final    BOTTLES DRAWN AEROBIC AND ANAEROBIC Blood Culture results may not be optimal due to an excessive volume of blood received in culture bottles   Culture   Final    NO GROWTH 5 DAYS Performed at [facility], [address] [place], [place] [address]    Report Status 06/12/2021 FINAL  Final  Resp Panel by RT-PCR (Flu A&B, Covid) Nasopharyngeal Swab     Status: None   Collection Time: 06/07/21  3:02 AM   Specimen: Nasopharyngeal Swab; Nasopharyngeal(NP) swabs in vial transport medium  Result Value Ref Range Status   SARS Coronavirus 2 by RT PCR NEGATIVE NEGATIVE Final    Comment: (NOTE) SARS-CoV-2 target nucleic acids are NOT DETECTED.  The SARS-CoV-2 RNA is generally detectable in upper  respiratory specimens during the acute phase of infection. The lowest concentration of SARS-CoV-2 viral copies this assay can detect is 138 copies/mL. A negative result does not preclude SARS-Cov-2 infection and should not be used as the sole basis for treatment or other patient management decisions. A negative result may occur with  improper specimen collection/handling, submission of specimen other than nasopharyngeal swab, presence of viral mutation(s) within the areas targeted by this assay, and inadequate number of viral copies(<138 copies/mL). A negative result must be combined with clinical observations, patient history, and epidemiological information. The expected result is Negative.  Fact Sheet for Patients:  EntrepreneurPulse.com.au  Fact Sheet for Healthcare Providers:  IncredibleEmployment.be  This test is no t yet approved or cleared by the [place] FDA and  has been authorized for detection and/or diagnosis of SARS-CoV-2 by FDA under an Emergency Use Authorization (EUA). This EUA will remain  in effect (meaning this test can be used) for the duration of the COVID-19 declaration under Section 564(b)(1) of the Act, 21 U.S.C.section 360bbb-3(b)(1), unless the authorization is terminated  or revoked sooner.       Influenza A by PCR NEGATIVE NEGATIVE Final   Influenza B by PCR NEGATIVE NEGATIVE Final    Comment: (NOTE) The Xpert Xpress SARS-CoV-2/FLU/RSV plus assay is intended as an aid in the diagnosis of influenza from Nasopharyngeal swab specimens and should not be used as a sole basis for treatment. Nasal washings and aspirates are unacceptable for Xpert Xpress SARS-CoV-2/FLU/RSV testing.  Fact Sheet for Patients: EntrepreneurPulse.com.au  Fact Sheet for Healthcare Providers: IncredibleEmployment.be  This test is not yet approved or cleared by the [place] FDA and has been  authorized for detection and/or diagnosis of SARS-CoV-2 by FDA under an Emergency Use Authorization (EUA). This EUA will remain in effect (meaning this test can be used) for the duration of the COVID-19 declaration under Section 564(b)(1) of the Act, 21 U.S.C. section 360bbb-3(b)(1), unless the authorization is terminated or revoked.  Performed at [facility], [address] [place], [place] [address]   Culture, blood (Routine X 2) w Reflex to ID Panel     Status: None   Collection Time: 06/07/21  3:06 AM   Specimen: BLOOD LEFT FOREARM  Result Value Ref Range Status   Specimen Description BLOOD LEFT FOREARM  Final   Special Requests   Final    BOTTLES DRAWN AEROBIC AND ANAEROBIC Blood Culture adequate volume   Culture   Final    NO GROWTH 5 DAYS Performed at [facility], [address], [place], [place] [address]    Report Status 06/12/2021 FINAL  Final  Urine Culture  Status: None   Collection Time: 06/07/21  3:13 AM   Specimen: Urine, Clean Catch  Result Value Ref Range Status   Specimen Description   Final    URINE, CLEAN CATCH Performed at [facility], [address], [place], [place] [address]    Special Requests   Final    NONE Performed at [facility], [address] [place], [place] [address]    Culture   Final    NO GROWTH Performed at [facility], [place] [address], [place], [place] [address]    Report Status 06/08/2021 FINAL  Final  Microscopic Examination     Status: Abnormal   Collection Time: 06/11/21  3:07 PM   BLD  Result Value Ref Range Status   WBC, UA None seen 0 - 5 /hpf Final   RBC 0-2 0 - 2 /hpf Final   Epithelial Cells (non renal) None seen 0 - 10 /hpf Final   Casts Present (A) None seen /lpf Final   Cast Type Hyaline casts N/A Final   Crystals Present (A) N/A Final   Crystal Type Uric Acid N/A Final   Mucus, UA Present (A) Not Estab. Final   Bacteria, UA None seen None seen/Few Final   Urine Culture     Status: None   Collection Time: 06/11/21  3:53 PM   Specimen: Urine   UR  Result Value Ref Range Status   Urine Culture, Routine Final report  Final   Organism ID, Bacteria No growth  Final  Blood culture (routine x 2)     Status: None (Preliminary result)   Collection Time: 06/12/21  1:27 AM   Specimen: BLOOD  Result Value Ref Range Status   Specimen Description BLOOD LEFT FA  Final   Special Requests   Final    BOTTLES DRAWN AEROBIC AND ANAEROBIC Blood Culture adequate volume   Culture   Final    NO GROWTH 4 DAYS Performed at [facility], [address], [place], [place] [address]    Report Status PENDING  Incomplete  Blood culture (routine x 2)     Status: None (Preliminary result)   Collection Time: 06/12/21  1:27 AM   Specimen: BLOOD  Result Value Ref Range Status   Specimen Description BLOOD LEFT HAND  Final   Special Requests   Final    BOTTLES DRAWN AEROBIC AND ANAEROBIC Blood Culture adequate volume   Culture   Final    NO GROWTH 4 DAYS Performed at [facility], [address], [place], [place] [address]    Report Status PENDING  Incomplete  Resp Panel by RT-PCR (Flu A&B, Covid) Nasopharyngeal Swab     Status: None   Collection Time: 06/12/21  1:29 AM   Specimen: Nasopharyngeal Swab; Nasopharyngeal(NP) swabs in vial transport medium  Result Value Ref Range Status   SARS Coronavirus 2 by RT PCR NEGATIVE NEGATIVE Final    Comment: (NOTE) SARS-CoV-2 target nucleic acids are NOT DETECTED.  The SARS-CoV-2 RNA is generally detectable in upper respiratory specimens during the acute phase of infection. The lowest concentration of SARS-CoV-2 viral copies this assay can detect is 138 copies/mL. A negative result does not preclude SARS-Cov-2 infection and should not be used as the sole basis for treatment or other patient management decisions. A negative result may occur with  improper specimen collection/handling, submission of  specimen other than nasopharyngeal swab, presence of viral mutation(s) within the areas targeted by this assay, and inadequate number  of viral copies(<138 copies/mL). A negative result must be combined with clinical observations, patient history, and epidemiological information. The expected result is Negative.  Fact Sheet for Patients:  EntrepreneurPulse.com.au  Fact Sheet for Healthcare Providers:  IncredibleEmployment.be  This test is no t yet approved or cleared by the [place] FDA and  has been authorized for detection and/or diagnosis of SARS-CoV-2 by FDA under an Emergency Use Authorization (EUA). This EUA will remain  in effect (meaning this test can be used) for the duration of the COVID-19 declaration under Section 564(b)(1) of the Act, 21 U.S.C.section 360bbb-3(b)(1), unless the authorization is terminated  or revoked sooner.       Influenza A by PCR NEGATIVE NEGATIVE Final   Influenza B by PCR NEGATIVE NEGATIVE Final    Comment: (NOTE) The Xpert Xpress SARS-CoV-2/FLU/RSV plus assay is intended as an aid in the diagnosis of influenza from Nasopharyngeal swab specimens and should not be used as a sole basis for treatment. Nasal washings and aspirates are unacceptable for Xpert Xpress SARS-CoV-2/FLU/RSV testing.  Fact Sheet for Patients: EntrepreneurPulse.com.au  Fact Sheet for Healthcare Providers: IncredibleEmployment.be  This test is not yet approved or cleared by the [place] FDA and has been authorized for detection and/or diagnosis of SARS-CoV-2 by FDA under an Emergency Use Authorization (EUA). This EUA will remain in effect (meaning this test can be used) for the duration of the COVID-19 declaration under Section 564(b)(1) of the Act, 21 U.S.C. section 360bbb-3(b)(1), unless the authorization is terminated or revoked.  Performed at [facility], [address], [place], [place] [address]   Urine Culture     Status: None   Collection Time: 06/12/21  3:36 AM   Specimen: Urine, Random  Result Value Ref Range Status   Specimen Description   Final    URINE, RANDOM Performed at [facility], [address], [place], [place] [address]    Special Requests   Final    NONE Performed at [facility], [address] [place], [place] [address]    Culture   Final    NO GROWTH Performed at [facility], [place] [address], [place], [place] [address]    Report Status 06/13/2021 FINAL  Final         Radiology Studies: ECHOCARDIOGRAM COMPLETE  Result Date: 06/15/2021    ECHOCARDIOGRAM REPORT   Patient Name:   DOWELL HOON Date of Exam: 06/15/2021 Medical Rec #:  [id number]          Height:       76.0 in Accession #:    [id number]         Weight:       249.0 lb Date of Birth:  [birth year]          BSA:          2.431 m Patient Age:    70 years           BP:           126/76 mmHg Patient Gender: M                  HR:           87 bpm. Exam Location:  [facility] Procedure: 2D Echo, Cardiac Doppler and Color Doppler Indications:     Abnormal ECG R94.31  History:         Patient has no prior history of Echocardiogram examinations.  Arrythmias:RBBB; Risk Factors:Diabetes and Hypertension.  Sonographer:     Sherrie Sport Referring Phys:  [id number] Sidney Ace Diagnosing Phys: Kathlyn Sacramento MD IMPRESSIONS  1. Left ventricular ejection fraction, by estimation, is 60 to 65%. The left ventricle has normal function. The left ventricle has no regional wall motion abnormalities. There is moderate left ventricular hypertrophy. Left ventricular diastolic function  could not be evaluated.  2. Right ventricular systolic function is normal. The right ventricular size is normal. Tricuspid regurgitation signal is inadequate for assessing PA pressure.  3. The mitral valve is normal in structure. No evidence of mitral valve regurgitation. No  evidence of mitral stenosis.  4. The aortic valve is normal in structure. Aortic valve regurgitation is not visualized. Mild aortic valve sclerosis is present, with no evidence of aortic valve stenosis.  5. Aortic dilatation noted. There is mild dilatation of the aortic root and of the ascending aorta, measuring 40 mm.  6. No apical images. FINDINGS  Left Ventricle: Left ventricular ejection fraction, by estimation, is 60 to 65%. The left ventricle has normal function. The left ventricle has no regional wall motion abnormalities. The left ventricular internal cavity size was normal in size. There is  moderate left ventricular hypertrophy. Left ventricular diastolic function could not be evaluated. Right Ventricle: The right ventricular size is normal. No increase in right ventricular wall thickness. Right ventricular systolic function is normal. Tricuspid regurgitation signal is inadequate for assessing PA pressure. Left Atrium: Left atrial size was normal in size. Right Atrium: Right atrial size was normal in size. Pericardium: Trivial pericardial effusion is present. Mitral Valve: The mitral valve is normal in structure. No evidence of mitral valve regurgitation. No evidence of mitral valve stenosis. Tricuspid Valve: The tricuspid valve is normal in structure. Tricuspid valve regurgitation is not demonstrated. No evidence of tricuspid stenosis. Aortic Valve: The aortic valve is normal in structure. Aortic valve regurgitation is not visualized. Mild aortic valve sclerosis is present, with no evidence of aortic valve stenosis. Pulmonic Valve: The pulmonic valve was normal in structure. Pulmonic valve regurgitation is mild. No evidence of pulmonic stenosis. Aorta: Aortic dilatation noted. There is mild dilatation of the aortic root and of the ascending aorta, measuring 40 mm. Venous: The inferior vena cava was not well visualized. IAS/Shunts: No atrial level shunt detected by color flow Doppler.  LEFT VENTRICLE PLAX  2D LVIDd:         4.40 cm LVIDs:         3.20 cm LV PW:         1.60 cm LV IVS:        1.50 cm LVOT diam:     2.20 cm LVOT Area:     3.80 cm  LEFT ATRIUM         Index LA diam:    2.70 cm 1.11 cm/m                        PULMONIC VALVE AORTA                 PV Vmax:        0.36 m/s Ao Root diam: 3.87 cm PV Vmean:       54.300 cm/s                       PV VTI:         0.133 m  PV Peak grad:   0.5 mmHg                       PV Mean grad:   1.0 mmHg                       RVOT Peak grad: 3 mmHg   SHUNTS Systemic Diam: 2.20 cm Pulmonic VTI:  0.141 m Kathlyn Sacramento MD Electronically signed by Kathlyn Sacramento MD Signature Date/Time: 06/15/2021/1:07:56 PM    Final         Scheduled Meds:  cholecalciferol  5,000 Units Oral Daily   ciprofloxacin  500 mg Oral BID   feeding supplement  237 mL Oral BID BM   gabapentin  400 mg Oral Daily   gabapentin  800 mg Oral QHS   heparin  5,000 Units Subcutaneous Q8H   lisinopril  15 mg Oral Daily   metroNIDAZOLE  500 mg Oral Q8H   multivitamin with minerals  1 tablet Oral Daily   pantoprazole (PROTONIX) IV  40 mg Intravenous Q12H   Continuous Infusions:     LOS: 4 days    Time spent: 25 minutes    Sidney Ace, MD [facility]   If 7PM-7AM, please contact night-coverage  06/16/2021, 10:40 AM

## 2021-06-16 NOTE — Telephone Encounter (Signed)
Please see concerns below and advise.

## 2021-06-16 NOTE — [place] FL2 (Signed)
[place] MEDICAID FL2 LEVEL OF CARE SCREENING TOOL     IDENTIFICATION  Patient Name: Daniel Leon Birthdate: Jan 02, 1951 Sex: male Admission Date (Current Location): 06/12/2021  [facility] and [place] Number:  Chiropodist and Address:  [place], [address] [place], [place] [address]      Provider Number: [id number]  Attending Physician Name and Address:  Tresa Moore, MD  Relative Name and Phone Number:       Current Level of Care: Hospital Recommended Level of Care: Skilled Nursing Facility Prior Approval Number:    Date Approved/Denied:   PASRR Number: [id number]  Discharge Plan: SNF    Current Diagnoses: Patient Active Problem List   Diagnosis Date Noted   Severe sepsis (HCC) 06/12/2021   CKD (chronic kidney disease), stage IIIa 06/12/2021   Duodenitis 06/12/2021   Elevated troponin 06/12/2021   Abnormal LFTs 06/12/2021   Cellulitis of second toe of right foot 06/07/2021   AKI (acute kidney injury) (HCC) 06/07/2021   Hematuria 06/07/2021   Senile purpura (HCC) 12/03/2019   History of below-knee amputation of left lower extremity (HCC) 10/13/2018   Charcot foot due to diabetes mellitus (HCC) 01/26/2018   UTI (urinary tract infection) 07/28/2017   Neuropathy 12/29/2016   BPH (benign prostatic hyperplasia) 12/29/2016   Advanced care planning/counseling discussion 12/29/2016   DM type 2 with diabetic peripheral neuropathy (HCC) 06/30/2016   Normocytic anemia 07/28/2015   Essential hypertension     Orientation RESPIRATION BLADDER Height & Weight     Self, Time, Situation, Place  Normal Continent Weight: 249 lb (112.9 kg) Height:  6\' 4"  (193 cm)  BEHAVIORAL SYMPTOMS/MOOD NEUROLOGICAL BOWEL NUTRITION STATUS      Incontinent Diet (heart healthy, thin liquids)  AMBULATORY STATUS COMMUNICATION OF NEEDS Skin   Limited Assist Verbally Other (Comment), Surgical wounds (closed incision right food, guaze dressing,  change dialy.)                       Personal Care Assistance Level of Assistance  Bathing, Dressing, Feeding Bathing Assistance: Limited assistance Feeding assistance: Independent Dressing Assistance: Limited assistance     Functional Limitations Info  Sight, Hearing, Speech Sight Info: Adequate Hearing Info: Adequate Speech Info: Adequate    SPECIAL CARE FACTORS FREQUENCY  PT (By licensed PT), OT (By licensed OT)     PT Frequency: 5x OT Frequency: 5x            Contractures Contractures Info: Not present    Additional Factors Info  Code Status, Allergies Code Status Info: Full Code Allergies Info: Oxycodone           Current Medications (06/16/2021):  This is the current hospital active medication list Current Facility-Administered Medications  Medication Dose Route Frequency Provider Last Rate Last Admin   cholecalciferol (VITAMIN D) tablet 5,000 Units  5,000 Units Oral Daily 06/18/2021, MD   5,000 Units at 06/15/21 0910   ciprofloxacin (CIPRO) tablet 500 mg  500 mg Oral BID 06/17/21, RPH   500 mg at 06/15/21 2255   diclofenac sodium (VOLTAREN) 1 % transdermal gel 1 application  1 application Topical QID PRN 2256, MD       feeding supplement (ENSURE ENLIVE / ENSURE PLUS) liquid 237 mL  237 mL Oral BID BM Lorretta Harp, Sudheer B, MD   237 mL at 06/15/21 0910   gabapentin (NEURONTIN) capsule 400 mg  400 mg Oral Daily 06/17/21, MD  400 mg at 06/15/21 0909   gabapentin (NEURONTIN) capsule 800 mg  800 mg Oral QHS Lorretta Harp, MD   800 mg at 06/15/21 2255   heparin injection 5,000 Units  5,000 Units Subcutaneous Alean Rinne, MD   5,000 Units at 06/16/21 3545   hydrALAZINE (APRESOLINE) injection 10 mg  10 mg Intravenous Q4H PRN Lolita Patella B, MD       lisinopril (ZESTRIL) tablet 15 mg  15 mg Oral Daily Georgeann Oppenheim, Sudheer B, MD   15 mg at 06/15/21 6256   loperamide (IMODIUM) capsule 2 mg  2 mg Oral PRN Lorretta Harp, MD       metroNIDAZOLE (FLAGYL)  tablet 500 mg  500 mg Oral Q8H Lowella Bandy, RPH   500 mg at 06/16/21 3893   morphine 2 MG/ML injection 2 mg  2 mg Intravenous Q4H PRN Lorretta Harp, MD   2 mg at 06/15/21 2254   multivitamin with minerals tablet 1 tablet  1 tablet Oral Daily Lorretta Harp, MD   1 tablet at 06/15/21 0910   ondansetron (ZOFRAN) injection 4 mg  4 mg Intravenous Q6H PRN Lolita Patella B, MD       pantoprazole (PROTONIX) injection 40 mg  40 mg Intravenous Willette Pa, MD   40 mg at 06/15/21 2255     Discharge Medications: Please see discharge summary for a list of discharge medications.  Relevant Imaging Results:  Relevant Lab Results:   Additional Information SSN:[id number]  Reuel Boom Eli Adami, LCSW

## 2021-06-16 NOTE — Anesthesia Postprocedure Evaluation (Signed)
Anesthesia Post Note  Patient: Daniel Leon  Procedure(s) Performed: AMPUTATION TOE (Right: Toe)  Patient location during evaluation: PACU Anesthesia Type: General Level of consciousness: awake and alert Pain management: pain level controlled Vital Signs Assessment: post-procedure vital signs reviewed and stable Respiratory status: spontaneous breathing, nonlabored ventilation, respiratory function stable and patient connected to nasal cannula oxygen Cardiovascular status: blood pressure returned to baseline and stable Postop Assessment: no apparent nausea or vomiting Anesthetic complications: no   No notable events documented.   Last Vitals:  Vitals:   06/08/21 0358 06/08/21 0849  BP: (!) 146/81 134/90  Pulse: (!) 102 84  Resp: 20 20  Temp: 37.1 C 36.8 C  SpO2: 96% 96%    Last Pain:  Vitals:   06/08/21 0849  TempSrc: Oral  PainSc:                  Lenard Simmer

## 2021-06-16 NOTE — Progress Notes (Signed)
Physical Therapy Treatment Patient Details Name: Daniel Leon MRN: [id number] DOB: 11-Jan-1951 Today's Date: 06/16/2021   History of Present Illness 70 y.o. male with medical history significant of hypertension, diet-controlled diabetes, right bundle blockade, peripheral neuropathy, BPH, anemia, s/p of left BKA, CKD, s/p of right second toe amputation (10/16), who presented to [facility] with weakness and nausea. Pt admitted for sepsis secondary to duodenitis    PT Comments    Pt seen this afternoon, received sitting on edge of bed, wife at bedside. Discussed POC and pt's current LOF. Completed seated B LE strengthening after pt had donned R post op shoe and L prosthesis. Height of bed raised in order for pt to stand with ModA due to significant weakness in LE's.  Once standing and safe balance attained, pt tolerated 79ft with RW, CGA, followed with chair in case of buckling. Pt returned to room without event. Discussed the fact that pt was completely independent with mobility without AD and currently requires a RW for balance and assistance for transfers.  Therefore, pt agreeable to recommendations for short term rehab placement [facility]) prior to returning home with wife.    Recommendations for follow up therapy are one component of a multi-disciplinary discharge planning process, led by the attending physician.  Recommendations may be updated based on patient status, additional functional criteria and insurance authorization.  Follow Up Recommendations  Skilled nursing-short term rehab (<3 hours/day)     Assistance Recommended at Discharge None  Equipment Recommendations       Recommendations for Other Services       Precautions / Restrictions Precautions Precautions: Fall Restrictions Weight Bearing Restrictions: Yes RLE Weight Bearing: Weight bearing as tolerated Other Position/Activity Restrictions: in post op shoe     Mobility  Bed Mobility               General bed  mobility comments:  (Pt sitting EOB upon arrival)    Transfers Overall transfer level: Needs assistance Equipment used: Rolling walker (2 wheels) Transfers: Sit to/from Stand Sit to Stand: Mod assist;From elevated surface           General transfer comment:  (MinA to initially attain standing balance at RW)    Ambulation/Gait Ambulation/Gait assistance: Min guard Gait Distance (Feet):  (40) Assistive device: Rolling walker (2 wheels) Gait Pattern/deviations: Step-to pattern     General Gait Details:  (Slow well thought out steps with attention to safety and balance)   Stairs             Wheelchair Mobility    Modified Rankin (Stroke Patients Only)       Balance                                            Cognition Arousal/Alertness: Awake/alert Behavior During Therapy: WFL for tasks assessed/performed Overall Cognitive Status: Within Functional Limits for tasks assessed                                 General Comments:  (Very motivated to get stronger)        Exercises General Exercises - Lower Extremity Long Arc Quad: AROM;Strengthening;Both;20 reps;Seated (with L prosthesis donned) Hip Flexion/Marching: Strengthening;Both;20 reps;Seated Other Exercises Other Exercises:  (Pt and wife educated on benefits of continuing exercises independently)    General Comments General  comments (skin integrity, edema, etc.): No dizziness with positional changes, no c/o pain with mobility. Pt very motivated with supportive wife at bedside      Pertinent Vitals/Pain Pain Assessment: No/denies pain    Home Living                          Prior Function            PT Goals (current goals can now be found in the care plan section) Acute Rehab PT Goals Patient Stated Goal: to feel better and then go home Progress towards PT goals: Progressing toward goals    Frequency    Min 2X/week      PT Plan Current plan  remains appropriate    Co-evaluation              AM-PAC PT "6 Clicks" Mobility   Outcome Measure  Help needed turning from your back to your side while in a flat bed without using bedrails?: A Little Help needed moving from lying on your back to sitting on the side of a flat bed without using bedrails?: A Little Help needed moving to and from a bed to a chair (including a wheelchair)?: A Little Help needed standing up from a chair using your arms (e.g., wheelchair or bedside chair)?: A Little Help needed to walk in hospital room?: A Lot Help needed climbing 3-5 steps with a railing? : A Lot 6 Click Score: 16    End of Session Equipment Utilized During Treatment: Gait belt Activity Tolerance: Patient tolerated treatment well Patient left: in bed;with call bell/phone within reach;with family/visitor present Nurse Communication: Mobility status PT Visit Diagnosis: Unsteadiness on feet (R26.81);Muscle weakness (generalized) (M62.81);Difficulty in walking, not elsewhere classified (R26.2)     Time: 1450-1530 PT Time Calculation (min) (ACUTE ONLY): 40 min  Charges:  $Gait Training: 8-22 mins $Therapeutic Exercise: 8-22 mins $Therapeutic Activity: 8-22 mins                    Zadie Cleverly, PTA    Jannet Askew 06/16/2021, 4:34 PM

## 2021-06-16 NOTE — Telephone Encounter (Signed)
Has he been discharged? If he's still in the hospital, he will need to discus this with the doctor in the hospital. If he has been discharged he needs a hospital follow up ASAP. Dr. Charlotta Newton saw him last- OK to book with her

## 2021-06-16 NOTE — TOC Initial Note (Signed)
Transition of Care [facility]) - Initial/Assessment Note    Patient Details  Name: Daniel Leon MRN: [id number] Date of Birth: February 12, 1951  Transition of Care [facility]) CM/SW Contact:    Maree Krabbe, LCSW Phone Number: 06/16/2021, 8:55 AM  Clinical Narrative:     CSW spoke with pt and pt states he wants to talk to MD regarding dc plan. Pt is aware PT recommended SNF however wants to make sure the MD thinks that is necessary. Pt also would like to speak to Spouse regarding the matter. Pt states if he does go to SNF his choice would be [facility]. CSW will prepare workup in the case pt is agreeable to SNF. CSW will continue to check in with pt.              Expected Discharge Plan: Skilled Nursing Facility Barriers to Discharge: Continued Medical Work up   Patient Goals and CMS Choice Patient states their goals for this hospitalization and ongoing recovery are:: to get better   Choice offered to / list presented to : Patient  Expected Discharge Plan and Services Expected Discharge Plan: Skilled Nursing Facility     Post Acute Care Choice:  (undecided at this time) Living arrangements for the past 2 months: Single Family Home                                      Prior Living Arrangements/Services Living arrangements for the past 2 months: Single Family Home Lives with:: Spouse Patient language and need for interpreter reviewed:: Yes Do you feel safe going back to the place where you live?: Yes      Need for Family Participation in Patient Care: Yes (Comment) Care giver support system in place?: Yes (comment)   Criminal Activity/Legal Involvement Pertinent to Current Situation/Hospitalization: No - Comment as needed  Activities of Daily Living Home Assistive Devices/Equipment: Cane (specify quad or straight), Prosthesis ADL Screening (condition at time of admission) Patient's cognitive ability adequate to safely complete daily activities?: Yes Is the patient deaf  or have difficulty hearing?: No Does the patient have difficulty seeing, even when wearing glasses/contacts?: No Does the patient have difficulty concentrating, remembering, or making decisions?: No Patient able to express need for assistance with ADLs?: No Does the patient have difficulty dressing or bathing?: No Independently performs ADLs?: Yes (appropriate for developmental age) Does the patient have difficulty walking or climbing stairs?: No Weakness of Legs: None Weakness of Arms/Hands: None  Permission Sought/Granted Permission sought to share information with : Family Supports Permission granted to share information with : Yes, Release of Information Signed  Share Information with NAME: Claris Che  Permission granted to share info w AGENCY: Licensed conveyancer granted to share info w Relationship: spouse     Emotional Assessment Appearance:: Appears stated age Attitude/Demeanor/Rapport: Engaged Affect (typically observed): Accepting Orientation: : Oriented to  Time, Oriented to Place, Oriented to Self, Oriented to Situation Alcohol / Substance Use: Not Applicable Psych Involvement: No (comment)  Admission diagnosis:  Sepsis (HCC) [A41.9] Severe sepsis (HCC) [A41.9, R65.20] Sepsis, due to unspecified organism, unspecified whether acute organ dysfunction present [facility]) [A41.9] Patient Active Problem List   Diagnosis Date Noted   Severe sepsis (HCC) 06/12/2021   CKD (chronic kidney disease), stage IIIa 06/12/2021   Duodenitis 06/12/2021   Elevated troponin 06/12/2021   Abnormal LFTs 06/12/2021   Cellulitis of second toe of right foot  06/07/2021   AKI (acute kidney injury) (HCC) 06/07/2021   Hematuria 06/07/2021   Senile purpura (HCC) 12/03/2019   History of below-knee amputation of left lower extremity (HCC) 10/13/2018   Charcot foot due to diabetes mellitus (HCC) 01/26/2018   UTI (urinary tract infection) 07/28/2017   Neuropathy 12/29/2016   BPH (benign  prostatic hyperplasia) 12/29/2016   Advanced care planning/counseling discussion 12/29/2016   DM type 2 with diabetic peripheral neuropathy (HCC) 06/30/2016   Normocytic anemia 07/28/2015   Essential hypertension    PCP:  Dorcas Carrow, DO Pharmacy:   [facility] [address] [place] - [address] [place] [address] Phone: [phone] Fax: [phone]  [id number] [place], [place] - [address] [place] [address] Phone: [phone] Fax: [phone]  [id number] [place], [place] - [address] AT [facility] [address] [place] [address] Phone: [phone] Fax: [phone]     Social Determinants of Health (SDOH) Interventions    Readmission Risk Interventions No flowsheet data found.

## 2021-06-17 DIAGNOSIS — A419 Sepsis, unspecified organism: Secondary | ICD-10-CM | POA: Diagnosis not present

## 2021-06-17 DIAGNOSIS — R6520 Severe sepsis without septic shock: Secondary | ICD-10-CM | POA: Diagnosis not present

## 2021-06-17 LAB — CBC WITH DIFFERENTIAL/PLATELET
Abs Immature Granulocytes: 1.46 10*3/uL — ABNORMAL HIGH (ref 0.00–0.07)
Basophils Absolute: 0.1 10*3/uL (ref 0.0–0.1)
Basophils Relative: 1 %
Eosinophils Absolute: 0.3 10*3/uL (ref 0.0–0.5)
Eosinophils Relative: 2 %
HCT: 33.1 % — ABNORMAL LOW (ref 39.0–52.0)
Hemoglobin: 11.1 g/dL — ABNORMAL LOW (ref 13.0–17.0)
Immature Granulocytes: 14 %
Lymphocytes Relative: 16 %
Lymphs Abs: 1.7 10*3/uL (ref 0.7–4.0)
MCH: 29.1 pg (ref 26.0–34.0)
MCHC: 33.5 g/dL (ref 30.0–36.0)
MCV: 86.9 fL (ref 80.0–100.0)
Monocytes Absolute: 1 10*3/uL (ref 0.1–1.0)
Monocytes Relative: 9 %
Neutro Abs: 6.3 10*3/uL (ref 1.7–7.7)
Neutrophils Relative %: 58 %
Platelets: 403 10*3/uL — ABNORMAL HIGH (ref 150–400)
RBC: 3.81 MIL/uL — ABNORMAL LOW (ref 4.22–5.81)
RDW: 14.3 % (ref 11.5–15.5)
Smear Review: NORMAL
WBC: 10.7 10*3/uL — ABNORMAL HIGH (ref 4.0–10.5)
nRBC: 0.2 % (ref 0.0–0.2)

## 2021-06-17 LAB — BASIC METABOLIC PANEL
Anion gap: 8 (ref 5–15)
BUN: 22 mg/dL (ref 8–23)
CO2: 27 mmol/L (ref 22–32)
Calcium: 8.9 mg/dL (ref 8.9–10.3)
Chloride: 99 mmol/L (ref 98–111)
Creatinine, Ser: 1.15 mg/dL (ref 0.61–1.24)
GFR, Estimated: >60 mL/min (ref >60)
Glucose, Bld: 125 mg/dL — ABNORMAL HIGH (ref 70–99)
Potassium: 4 mmol/L (ref 3.5–5.1)
Sodium: 134 mmol/L — ABNORMAL LOW (ref 135–145)

## 2021-06-17 LAB — RESP PANEL BY RT-PCR (FLU A&B, COVID) ARPGX2
Influenza A by PCR: NEGATIVE
Influenza B by PCR: NEGATIVE
SARS Coronavirus 2 by RT PCR: POSITIVE — AB

## 2021-06-17 LAB — CULTURE, BLOOD (ROUTINE X 2)
Culture: NO GROWTH
Culture: NO GROWTH
Special Requests: ADEQUATE
Special Requests: ADEQUATE

## 2021-06-17 NOTE — Progress Notes (Signed)
PROGRESS NOTE    Daniel Leon  ERX:[id number] DOB: Apr 22, 1951 DOA: 06/12/2021 PCP: Valerie Roys, DO    Brief Narrative:  70 y.o. male with medical history significant of hypertension, diet-controlled diabetes, right bundle blockade, peripheral neuropathy, BPH, anemia, s/p of left BKA, CKD-3A, s/p of right second toe amputation due to to osteomyelitis, who presents with weakness and nausea.   Patient has history of right second toe osteomyelitis.  Patient is s/p of right second toe amputation on 06/07/2021.  Patient was initially treated with doxycycline.  He states that he has not been feeling well in the past several days.  Patient has severe generalized weakness, poor appetite, decreased oral intake.  He has nausea and dry heaves, no diarrhea or abdominal pain.  No fever or chills.  Patient has mild dry cough, no chest pain or shortness of breath.  Denies symptoms of UTI. He states that he does not have worsening pain in in the surgical site.  No active drainage noted.  Patient was seen by his podiatrist yesterday, who thought that his wound looks okay.  Patient was started on Bactrim.  On admission patient was seen in consultation by podiatry who did not feel that the toe was the source of sepsis.  No surgical intervention warranted.  CT imaging survey concerning for duodenitis.  Patient does report subjective improvement since initiation of antibiotics and IV fluids.  Still significantly weak and deconditioned.  Therapy is made recommendation for skilled nursing facility.  Patient is somewhat reluctant.  He is improving clinically.  10/26 no overnight issues   Assessment & Plan:   Principal Problem:   Severe sepsis (HCC) Active Problems:   Essential hypertension   Normocytic anemia   UTI (urinary tract infection)   Cellulitis of second toe of right foot   CKD (chronic kidney disease), stage IIIa   Duodenitis   Elevated troponin   Abnormal LFTs  Duodenitis Right lower  extremity cellulitis Possible urinary tract infection Severe sepsis secondary to above Criteria met with leukocytosis, tachycardia, tachypnea, elevated lactic acid Initial hypotension resolved with IV fluid resuscitation Suspect that duodenitis is primary driver for infection No evidence of osteomyelitis No surgical intervention from podiatry standpoint 10/26 continue ciprofloxacin and Flagyl Antiemetics pain control Cultures to date negative  Essential hypertension Continue lisinopril   Normocytic anemia Suspect anemia of chronic kidney disease H/h stable Monitor periodically    Acute on CKD (chronic kidney disease), stage IIIa::  Renal function close to baseline.  Recent baseline creatinine 1.15-1.39, his creatinine is 1.36, BUN 26. 10/26 renal function improved with IV fluids   Abnormal liver function Patient has some mild abnormal liver function, likely due to sepsis. -Monitor   Elevated troponin:  Troponin level 60, 69, no chest pain, likely due to demand ischemia. -Echocardiogram reassuring.  Normal EF.  No valvulopathy.  Found with mild dilatation of aortic root and ascending aorta on echo Need to f/u with cards as outpt       DVT prophylaxis: SQ heparin Code Status: Full Family Communication: none at bedside Disposition Plan: Status is: Inpatient  Remains inpatient appropriate because: Severe sepsis secondary to likely duodenitis.  Sepsis physiology resolved.  Plan for dc to snf in am      Level of care: Med-Surg  Consultants:  Podiatry  Procedures:  None  Antimicrobials: Ciprofloxacin Metronidazole   Subjective: Pt has no complaints this am. No sob, cp, abd pain  Objective: Vitals:   06/16/21 1500 06/16/21 2023 06/17/21 0454 06/17/21 9509  BP: 130/80 (!) 153/74 132/79 134/80  Pulse: 91 100 88 91  Resp: 16 (!) 22 20   Temp: 98.2 F (36.8 C) 98.7 F (37.1 C) 97.9 F (36.6 C) 98 F (36.7 C)  TempSrc: Oral Oral Oral Oral  SpO2:  94% 92% 90% 94%  Weight:      Height:        Intake/Output Summary (Last 24 hours) at 06/17/2021 1356 Last data filed at 06/17/2021 1057 Gross per 24 hour  Intake 360 ml  Output 1500 ml  Net -1140 ml   Filed Weights   06/12/21 0121  Weight: 112.9 kg    Examination: Nad, calm Cta no w/r/r Regular s1/s2 no gallop Soft benign +bs No edema Aaxoxo3 Mood and affect appropriate in current setting   Data Reviewed: I have personally reviewed following labs and imaging studies  CBC: Recent Labs  Lab 06/11/21 1507 06/12/21 0120 06/13/21 0503 06/15/21 0427 06/16/21 0502 06/17/21 0450  WBC 16.1* 24.2* 16.3* 10.2 10.4 10.7*  NEUTROABS 12.9* 22.8*  --  6.8 5.8 6.3  HGB 13.0 11.8* 9.7* 10.1* 10.9* 11.1*  HCT 36.6* 33.5* 29.0* 29.8* 31.5* 33.1*  MCV 84 86.6 87.6 88.2 87.7 86.9  PLT 491* 327 286 358 402* 793*   Basic Metabolic Panel: Recent Labs  Lab 06/12/21 0120 06/13/21 0503 06/15/21 0427 06/16/21 0502 06/17/21 0450  NA 130* 131* 133* 134* 134*  K 3.9 4.1 3.9 3.9 4.0  CL 96* 103 103 99 99  CO2 '22 23 24 24 27  ' GLUCOSE 153* 97 97 113* 125*  BUN 26* 29* '22 23 22  ' CREATININE 1.36* 1.38* 1.21 1.11 1.15  CALCIUM 8.5* 7.8* 8.5* 9.0 8.9   GFR: Estimated Creatinine Clearance: 81 mL/min (by C-G formula based on SCr of 1.15 mg/dL). Liver Function Tests: Recent Labs  Lab 06/12/21 0120  AST 50*  ALT 72*  ALKPHOS 156*  BILITOT 1.1  PROT 7.0  ALBUMIN 2.9*   No results for input(s): LIPASE, AMYLASE in the last 168 hours. No results for input(s): AMMONIA in the last 168 hours. Coagulation Profile: Recent Labs  Lab 06/12/21 1556  INR 1.3*   Cardiac Enzymes: No results for input(s): CKTOTAL, CKMB, CKMBINDEX, TROPONINI in the last 168 hours. BNP (last 3 results) No results for input(s): PROBNP in the last 8760 hours. HbA1C: No results for input(s): HGBA1C in the last 72 hours. CBG: No results for input(s): GLUCAP in the last 168 hours.  Lipid Profile: No  results for input(s): CHOL, HDL, LDLCALC, TRIG, CHOLHDL, LDLDIRECT in the last 72 hours.  Thyroid Function Tests: No results for input(s): TSH, T4TOTAL, FREET4, T3FREE, THYROIDAB in the last 72 hours. Anemia Panel: No results for input(s): VITAMINB12, FOLATE, FERRITIN, TIBC, IRON, RETICCTPCT in the last 72 hours. Sepsis Labs: Recent Labs  Lab 06/12/21 0120 06/12/21 0338 06/12/21 1556 06/15/21 0427  PROCALCITON  --   --  4.63 0.48  LATICACIDVEN 2.4* 1.8  --   --     Recent Results (from the past 240 hour(s))  Microscopic Examination     Status: Abnormal   Collection Time: 06/11/21  3:07 PM   BLD  Result Value Ref Range Status   WBC, UA None seen 0 - 5 /hpf Final   RBC 0-2 0 - 2 /hpf Final   Epithelial Cells (non renal) None seen 0 - 10 /hpf Final   Casts Present (A) None seen /lpf Final   Cast Type Hyaline casts N/A Final   Crystals Present (A) N/A  Final   Crystal Type Uric Acid N/A Final   Mucus, UA Present (A) Not Estab. Final   Bacteria, UA None seen None seen/Few Final  Urine Culture     Status: None   Collection Time: 06/11/21  3:53 PM   Specimen: Urine   UR  Result Value Ref Range Status   Urine Culture, Routine Final report  Final   Organism ID, Bacteria No growth  Final  Blood culture (routine x 2)     Status: None   Collection Time: 06/12/21  1:27 AM   Specimen: BLOOD  Result Value Ref Range Status   Specimen Description BLOOD LEFT FA  Final   Special Requests   Final    BOTTLES DRAWN AEROBIC AND ANAEROBIC Blood Culture adequate volume   Culture   Final    NO GROWTH 5 DAYS Performed at [facility], [address], [place], [place] [address]    Report Status 06/17/2021 FINAL  Final  Blood culture (routine x 2)     Status: None   Collection Time: 06/12/21  1:27 AM   Specimen: BLOOD  Result Value Ref Range Status   Specimen Description BLOOD LEFT HAND  Final   Special Requests   Final    BOTTLES DRAWN AEROBIC AND ANAEROBIC Blood Culture adequate  volume   Culture   Final    NO GROWTH 5 DAYS Performed at [facility], [address] [place], [place] [address]    Report Status 06/17/2021 FINAL  Final  Resp Panel by RT-PCR (Flu A&B, Covid) Nasopharyngeal Swab     Status: None   Collection Time: 06/12/21  1:29 AM   Specimen: Nasopharyngeal Swab; Nasopharyngeal(NP) swabs in vial transport medium  Result Value Ref Range Status   SARS Coronavirus 2 by RT PCR NEGATIVE NEGATIVE Final    Comment: (NOTE) SARS-CoV-2 target nucleic acids are NOT DETECTED.  The SARS-CoV-2 RNA is generally detectable in upper respiratory specimens during the acute phase of infection. The lowest concentration of SARS-CoV-2 viral copies this assay can detect is 138 copies/mL. A negative result does not preclude SARS-Cov-2 infection and should not be used as the sole basis for treatment or other patient management decisions. A negative result may occur with  improper specimen collection/handling, submission of specimen other than nasopharyngeal swab, presence of viral mutation(s) within the areas targeted by this assay, and inadequate number of viral copies(<138 copies/mL). A negative result must be combined with clinical observations, patient history, and epidemiological information. The expected result is Negative.  Fact Sheet for Patients:  EntrepreneurPulse.com.au  Fact Sheet for Healthcare Providers:  IncredibleEmployment.be  This test is no t yet approved or cleared by the [place] FDA and  has been authorized for detection and/or diagnosis of SARS-CoV-2 by FDA under an Emergency Use Authorization (EUA). This EUA will remain  in effect (meaning this test can be used) for the duration of the COVID-19 declaration under Section 564(b)(1) of the Act, 21 U.S.C.section 360bbb-3(b)(1), unless the authorization is terminated  or revoked sooner.       Influenza A by PCR NEGATIVE NEGATIVE Final    Influenza B by PCR NEGATIVE NEGATIVE Final    Comment: (NOTE) The Xpert Xpress SARS-CoV-2/FLU/RSV plus assay is intended as an aid in the diagnosis of influenza from Nasopharyngeal swab specimens and should not be used as a sole basis for treatment. Nasal washings and aspirates are unacceptable for Xpert Xpress SARS-CoV-2/FLU/RSV testing.  Fact Sheet for Patients: EntrepreneurPulse.com.au  Fact Sheet for  Healthcare Providers: IncredibleEmployment.be  This test is not yet approved or cleared by the [place] and has been authorized for detection and/or diagnosis of SARS-CoV-2 by FDA under an Emergency Use Authorization (EUA). This EUA will remain in effect (meaning this test can be used) for the duration of the COVID-19 declaration under Section 564(b)(1) of the Act, 21 U.S.C. section 360bbb-3(b)(1), unless the authorization is terminated or revoked.  Performed at [facility], [address] [place], [place] [address]   Urine Culture     Status: None   Collection Time: 06/12/21  3:36 AM   Specimen: Urine, Random  Result Value Ref Range Status   Specimen Description   Final    URINE, RANDOM Performed at [facility], [address] [place], [place] [address]    Special Requests   Final    NONE Performed at [facility], [address], [place], [place] [address]    Culture   Final    NO GROWTH Performed at [facility], [place] [address] [place],  [address]    Report Status 06/13/2021 FINAL  Final         Radiology Studies: No results found.      Scheduled Meds:  cholecalciferol  5,000 Units Oral Daily   ciprofloxacin  500 mg Oral BID   feeding supplement  237 mL Oral BID BM   gabapentin  400 mg Oral Daily   gabapentin  800 mg Oral QHS   heparin  5,000 Units Subcutaneous Q8H   lisinopril  15 mg Oral Daily   metroNIDAZOLE  500 mg Oral Q8H   multivitamin with minerals  1  tablet Oral Daily   pantoprazole  40 mg Oral Daily   Continuous Infusions:     LOS: 5 days    Time spent: 35 minutes with more than 50% COC    Nolberto Hanlon, MD [facility]   If 7PM-7AM, please contact night-coverage  06/17/2021, 1:56 PM

## 2021-06-17 NOTE — Telephone Encounter (Signed)
Patient wife notified of recommendations per Dr.Johnson since patient is still hospitalized. Wife verbalized understanding and has no further questions at this time.

## 2021-06-17 NOTE — TOC Progression Note (Signed)
Transition of Care [facility]) - Progression Note    Patient Details  Name: Daniel Leon MRN: [id number] Date of Birth: 1951/06/24  Transition of Care [facility]) CM/SW Contact  Maree Krabbe, LCSW Phone Number: 06/17/2021, 9:29 AM  Clinical Narrative:   Glori Bickers started.    Expected Discharge Plan: Skilled Nursing Facility Barriers to Discharge: Continued Medical Work up  Expected Discharge Plan and Services Expected Discharge Plan: Skilled Nursing Facility     Post Acute Care Choice:  (undecided at this time) Living arrangements for the past 2 months: Single Family Home                                       Social Determinants of Health (SDOH) Interventions    Readmission Risk Interventions No flowsheet data found.

## 2021-06-17 NOTE — Progress Notes (Signed)
Occupational Therapy Treatment Patient Details Name: Daniel Leon MRN: [id number] DOB: 11-Sep-1950 Today's Date: 06/17/2021   History of present illness 70 y.o. male with medical history significant of hypertension, diet-controlled diabetes, right bundle blockade, peripheral neuropathy, BPH, anemia, s/p of left BKA, CKD, s/p of right second toe amputation (10/16), who presented to [facility] with weakness and nausea. Pt admitted for sepsis secondary to duodenitis   OT comments  Pt seen for OT treatment on this date. Upon arrival to room, pt awake and seated at EOB with wife present. Pt agreeable to OT tx. This date, pt performed x3 sit<>stand transfers from bed at varying elevated heights with MIN GUARD. Pt performed x1 transfer to/from [facility], requiring MIN A. While walking BSC>recliner (~72ft) with RW and MIN GUARD, pt reported dizziness. Pt's vitals obtained once seated in recliner (BP 103/67, HR 97, SpO2 93%). With head of chair lowered, pt reporting dizziness subsiding and BP 118/80. Further mobility deferred in setting of dizziness during functional mobility of short household distances. At end of session, pt left in recliner with head lowered, wife present, and pt denying dizziness. RN informed of vitals. Pt is making good progress toward goals and continues to benefit from skilled OT services to maximize return to PLOF and minimize risk of future falls, injury, caregiver burden, and readmission. Will continue to follow POC. Discharge recommendation remains appropriate.     Recommendations for follow up therapy are one component of a multi-disciplinary discharge planning process, led by the attending physician.  Recommendations may be updated based on patient status, additional functional criteria and insurance authorization.    Follow Up Recommendations  Skilled nursing-short term rehab (<3 hours/day)    Assistance Recommended at Discharge Intermittent Supervision/Assistance  Equipment  Recommendations  Other (comment) (defer to next venue of care)       Precautions / Restrictions Precautions Precautions: Fall Restrictions Weight Bearing Restrictions: Yes RLE Weight Bearing: Weight bearing as tolerated Other Position/Activity Restrictions: in post op shoe       Mobility Bed Mobility               General bed mobility comments: Not assessed, pt seated EOB upon arrival and seated in recliner at end of session    Transfers Overall transfer level: Needs assistance Equipment used: Rolling walker (2 wheels) Transfers: Sit to/from Stand Sit to Stand: Min assist;From elevated surface                 Balance Overall balance assessment: Needs assistance Sitting-balance support: No upper extremity supported;Feet supported Sitting balance-Leahy Scale: Good Sitting balance - Comments: Good sitting balance reaching outside BOS for LB dressing   Standing balance support: Bilateral upper extremity supported;During functional activity Standing balance-Leahy Scale: Fair Standing balance comment: MIN GUARD for functional mobility of short distances (69ft) with RW                           ADL either performed or assessed with clinical judgement   ADL Overall ADL's : Needs assistance/impaired     Grooming: Wash/dry hands;Set up;Sitting                   Toilet Transfer: Minimal assistance;BSC;Rolling walker (2 wheels)   Toileting- Clothing Manipulation and Hygiene: Supervision/safety;Set up;Sitting/lateral lean                Cognition Arousal/Alertness: Awake/alert Behavior During Therapy: WFL for tasks assessed/performed Overall Cognitive Status: Within Functional Limits for tasks  assessed                                            Exercises General Exercises - Upper Extremity Chair Push Up: Strengthening;Both;10 reps;Seated General Exercises - Lower Extremity Long Arc Quad: AROM;Strengthening;Both;20  reps;Seated Hip Flexion/Marching: Strengthening;Both;20 reps;Seated      General Comments Pt c/o of dizziness during ambulation. Vitals obtained sitting in recliner: BP 103/67, HR 97, SpO2 93%. BP sitting in recliner with head of chair lowered: 118/80 and pt denying dizziness. RN informed    Pertinent Vitals/ Pain       Pain Assessment: No/denies pain         Frequency  Min 2X/week        Progress Toward Goals  OT Goals(current goals can now be found in the care plan section)     Acute Rehab OT Goals OT Goal Formulation: With patient Time For Goal Achievement: 06/28/21 Potential to Achieve Goals: Fair  Plan Discharge plan remains appropriate;Frequency remains appropriate       AM-PAC OT "6 Clicks" Daily Activity     Outcome Measure   Help from another person eating meals?: None Help from another person taking care of personal grooming?: A Little Help from another person toileting, which includes using toliet, bedpan, or urinal?: A Little Help from another person bathing (including washing, rinsing, drying)?: A Lot Help from another person to put on and taking off regular upper body clothing?: None Help from another person to put on and taking off regular lower body clothing?: A Little 6 Click Score: 19    End of Session Equipment Utilized During Treatment: Gait belt;Rolling walker (2 wheels)  OT Visit Diagnosis: Other abnormalities of gait and mobility (R26.89);Muscle weakness (generalized) (M62.81)   Activity Tolerance Patient tolerated treatment well   Patient Left with family/visitor present;in chair;with call bell/phone within reach   Nurse Communication Mobility status;Other (comment) (vitals following functional mobility)        Time: 0160-1093 OT Time Calculation (min): 44 min  Charges: OT General Charges $OT Visit: 1 Visit OT Treatments $Self Care/Home Management : 38-52 mins  Matthew Folks, OTR/L [facility] [phone]

## 2021-06-17 NOTE — TOC Progression Note (Signed)
Transition of Care [facility]) - Progression Note    Patient Details  Name: GRYPHON VANDERVEEN MRN: [id number] Date of Birth: 12/10/1950  [facility]) CM/SW Contact  Chapman Fitch, RN Phone Number: 06/17/2021, 4:25 PM  Clinical Narrative:     Repeat covid test positive Patient and wife to discuss SNF vs home over night and let me know their decision tomorrow  If patient chooses to continue with [facility] he would not be able to admit to [facility] till 11/6  Expected Discharge Plan: Skilled Nursing Facility Barriers to Discharge: Continued Medical Work up  Expected Discharge Plan and Services Expected Discharge Plan: Skilled Nursing Facility     Post Acute Care Choice:  (undecided at this time) Living arrangements for the past 2 months: Single Family Home                                       Social Determinants of Health (SDOH) Interventions    Readmission Risk Interventions No flowsheet data found.

## 2021-06-17 NOTE — TOC Progression Note (Signed)
Transition of Care [facility]) - Progression Note    Patient Details  Name: Daniel Leon MRN: [id number] Date of Birth: 06/18/1951  Transition of Care [facility]) CM/SW Contact  Chapman Fitch, RN Phone Number: 06/17/2021, 1:03 PM  Clinical Narrative:     Berkley Harvey id for [employer] [id number] valid through 10/28 Per Verlon Au at [facility] bed not available till tomorrow. MD notified  Expected Discharge Plan: Skilled Nursing Facility Barriers to Discharge: Continued Medical Work up  Expected Discharge Plan and Services Expected Discharge Plan: Skilled Nursing Facility     Post Acute Care Choice:  (undecided at this time) Living arrangements for the past 2 months: Single Family Home                                       Social Determinants of Health (SDOH) Interventions    Readmission Risk Interventions No flowsheet data found.

## 2021-06-17 NOTE — Progress Notes (Signed)
Subjective:  Patient ID: Daniel Leon, male    DOB: 1951-05-31,  MRN: [id number]  Chief Complaint  Patient presents with   Routine Post Op    Toe amputation     DOS: 06/08/2019 Procedure: Right partial second toe amputation  70 y.o. male returns for post-op check.  Patient states that he is doing well.  No acute complaints.  The incision looks good.  Bandages clean dry and intact  Review of Systems: Negative except as noted in the HPI. Denies N/V/F/Ch.  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 No current outpatient medications on file.  Facility-Administered Medications Ordered in Other Visits:    cholecalciferol (VITAMIN D) tablet 5,000 Units, 5,000 Units, Oral, Daily, Lorretta Harp, MD, 5,000 Units at 06/17/21 0930   ciprofloxacin (CIPRO) tablet 500 mg, 500 mg, Oral, BID, Lowella Bandy, RPH, 500 mg at 06/17/21 2202   diclofenac sodium (VOLTAREN) 1 % transdermal gel 1 application, 1 application, Topical, QID PRN, Lorretta Harp, MD   feeding supplement (ENSURE ENLIVE / ENSURE PLUS) liquid 237 mL, 237 mL, Oral, BID BM, Sreenath, Sudheer B, MD, 237 mL at 06/17/21 1431   gabapentin (NEURONTIN) capsule 400 mg, 400 mg, Oral, Daily, Lorretta Harp, MD, 400 mg at 06/17/21 5427   gabapentin (NEURONTIN) capsule 800 mg, 800 mg, Oral, QHS, Lorretta Harp, MD, 800 mg at 06/16/21 2034   heparin injection 5,000 Units, 5,000 Units, Subcutaneous, Q8H, Lorretta Harp, MD, 5,000 Units at 06/17/21 1432   hydrALAZINE (APRESOLINE) injection 10 mg, 10 mg, Intravenous, Q4H PRN, Sreenath, Sudheer B, MD   lisinopril  (ZESTRIL) tablet 15 mg, 15 mg, Oral, Daily, Sreenath, Sudheer B, MD, 15 mg at 06/17/21 0623   loperamide (IMODIUM) capsule 2 mg, 2 mg, Oral, PRN, Lorretta Harp, MD   metroNIDAZOLE (FLAGYL) tablet 500 mg, 500 mg, Oral, Q8H, Lowella Bandy, RPH, 500 mg at 06/17/21 1432   morphine 2 MG/ML injection 2 mg, 2 mg, Intravenous, Q4H PRN, Lorretta Harp, MD, 2 mg at 06/17/21 0351   multivitamin with minerals tablet 1 tablet, 1 tablet, Oral, Daily, Lorretta Harp, MD, 1 tablet at 06/17/21 0929   ondansetron (ZOFRAN) injection 4 mg, 4 mg, Intravenous, Q6H PRN, Sreenath, Sudheer B, MD   pantoprazole (PROTONIX) EC tablet 40 mg, 40 mg, Oral, Daily, Georgeann Oppenheim, Sudheer B, MD, 40 mg at 06/17/21 7628  Social History   Tobacco Use  Smoking Status Former   Packs/day: 0.50   Years: 10.00   Pack years: 5.00   Types: Cigarettes   Quit date: 07/27/2000   Years since quitting: 20.9  Smokeless Tobacco Never    Allergies  Allergen Reactions   Oxycodone Itching   Objective:  There were no vitals filed for this visit. There is no height or weight on file to calculate BMI. Constitutional Well developed. Well nourished.  Vascular Foot warm and well perfused. Capillary refill normal to all digits.   Neurologic Normal speech. Oriented to person, place, and time. Epicritic sensation to light touch grossly present bilaterally.  Dermatologic Skin healing well without  signs of infection. Skin edges well coapted without signs of infection.  Orthopedic: Tenderness to palpation noted about the surgical site.   Radiographs: 3 views of skeletally mature the right foot: Sharp surgical margin noted of the prior x-ray.  No signs of osteomyelitis noted. Assessment:   1. History of amputation of lesser toe of right foot (HCC)    Plan:  Patient was evaluated and treated and all questions answered.  S/p foot surgery right -Progressing as expected post-operatively. -XR: See above -WB Status: Weightbearing as tolerated in surgical  shoe -Sutures: Intact.  No clinical signs of dehiscence noted.  No complication noted. -Medications: None -Foot redressed.  No follow-ups on file.

## 2021-06-18 DIAGNOSIS — A419 Sepsis, unspecified organism: Secondary | ICD-10-CM | POA: Diagnosis not present

## 2021-06-18 DIAGNOSIS — R6520 Severe sepsis without septic shock: Secondary | ICD-10-CM | POA: Diagnosis not present

## 2021-06-18 LAB — BASIC METABOLIC PANEL WITH GFR
Anion gap: 8 (ref 5–15)
BUN: 24 mg/dL — ABNORMAL HIGH (ref 8–23)
CO2: 27 mmol/L (ref 22–32)
Calcium: 8.9 mg/dL (ref 8.9–10.3)
Chloride: 97 mmol/L — ABNORMAL LOW (ref 98–111)
Creatinine, Ser: 1.26 mg/dL — ABNORMAL HIGH (ref 0.61–1.24)
GFR, Estimated: >60 mL/min
Glucose, Bld: 139 mg/dL — ABNORMAL HIGH (ref 70–99)
Potassium: 4.3 mmol/L (ref 3.5–5.1)
Sodium: 132 mmol/L — ABNORMAL LOW (ref 135–145)

## 2021-06-18 LAB — CBC WITH DIFFERENTIAL/PLATELET
Abs Immature Granulocytes: 1.18 10*3/uL — ABNORMAL HIGH (ref 0.00–0.07)
Basophils Absolute: 0.1 10*3/uL (ref 0.0–0.1)
Basophils Relative: 1 %
Eosinophils Absolute: 0.3 10*3/uL (ref 0.0–0.5)
Eosinophils Relative: 3 %
HCT: 32.7 % — ABNORMAL LOW (ref 39.0–52.0)
Hemoglobin: 10.8 g/dL — ABNORMAL LOW (ref 13.0–17.0)
Immature Granulocytes: 10 %
Lymphocytes Relative: 16 %
Lymphs Abs: 1.8 10*3/uL (ref 0.7–4.0)
MCH: 28.6 pg (ref 26.0–34.0)
MCHC: 33 g/dL (ref 30.0–36.0)
MCV: 86.5 fL (ref 80.0–100.0)
Monocytes Absolute: 1.1 10*3/uL — ABNORMAL HIGH (ref 0.1–1.0)
Monocytes Relative: 9 %
Neutro Abs: 7 10*3/uL (ref 1.7–7.7)
Neutrophils Relative %: 61 %
Platelets: 414 10*3/uL — ABNORMAL HIGH (ref 150–400)
RBC: 3.78 MIL/uL — ABNORMAL LOW (ref 4.22–5.81)
RDW: 14.5 % (ref 11.5–15.5)
Smear Review: NORMAL
WBC: 11.4 10*3/uL — ABNORMAL HIGH (ref 4.0–10.5)
nRBC: 0 % (ref 0.0–0.2)

## 2021-06-18 MED ORDER — SODIUM CHLORIDE 0.9 % IV SOLN
100.0000 mg | Freq: Every day | INTRAVENOUS | Status: AC
  Administered 2021-06-19 – 2021-06-22 (×4): 100 mg via INTRAVENOUS
  Filled 2021-06-18: qty 20
  Filled 2021-06-18 (×3): qty 100

## 2021-06-18 MED ORDER — SODIUM CHLORIDE 0.9 % IV SOLN
200.0000 mg | Freq: Once | INTRAVENOUS | Status: AC
  Administered 2021-06-18: 200 mg via INTRAVENOUS
  Filled 2021-06-18: qty 200

## 2021-06-18 NOTE — Progress Notes (Signed)
Physical Therapy Treatment Patient Details Name: Daniel Leon MRN: [id number] DOB: 01/15/1951 Today's Date: 06/18/2021   History of Present Illness 70 y.o. male with medical history significant of hypertension, diet-controlled diabetes, right bundle blockade, peripheral neuropathy, BPH, anemia, s/p of left BKA, CKD, s/p of right second toe amputation (10/16), who presented to [facility] with weakness and nausea. Pt admitted for sepsis secondary to duodenitis    PT Comments    Pt now + for Covid yet continues to make functional progress with PT. Focused session on strengthening of LE's to facilitate sit>stand transfers. Pt ambulated in room with RW, CGA, 20+ feet with good demonstration of safety awareness and pacing. Discussed POC and pt's progress with wife who will continue to work with pt on strengthening and mobility in room.     Recommendations for follow up therapy are one component of a multi-disciplinary discharge planning process, led by the attending physician.  Recommendations may be updated based on patient status, additional functional criteria and insurance authorization.  Follow Up Recommendations  Skilled nursing-short term rehab (<3 hours/day)     Assistance Recommended at Discharge None  Equipment Recommendations       Recommendations for Other Services       Precautions / Restrictions Precautions Precautions: Fall Restrictions Weight Bearing Restrictions: Yes RLE Weight Bearing: Weight bearing as tolerated Other Position/Activity Restrictions: in post op shoe     Mobility  Bed Mobility Overal bed mobility: Needs Assistance Bed Mobility: Supine to Sit;Sit to Supine Rolling: Modified independent (Device/Increase time)   Supine to sit: Min guard (and use of side rail)          Transfers Overall transfer level: Needs assistance Equipment used: Rolling walker (2 wheels) Transfers: Sit to/from Stand Sit to Stand: Min assist;From elevated surface            General transfer comment:  (Pt required less assistance to stand from bed compared to previous PT session)    Ambulation/Gait Ambulation/Gait assistance: Min guard Gait Distance (Feet): 20 Feet Assistive device: Rolling walker (2 wheels) Gait Pattern/deviations: Step-through pattern     General Gait Details:  (Pt's balance appears to be more steady as strength improves. Gait completed in room due to Covid and wife not present to assist with chair)   Stairs Stairs:  (No stairs at home)           Wheelchair Mobility    Modified Rankin (Stroke Patients Only)       Balance                                            Cognition Arousal/Alertness: Awake/alert Behavior During Therapy: WFL for tasks assessed/performed Overall Cognitive Status: Within Functional Limits for tasks assessed                                          Exercises      General Comments        Pertinent Vitals/Pain Pain Assessment: No/denies pain    Home Living                          Prior Function            PT Goals (current goals can now be  found in the care plan section) Acute Rehab PT Goals Patient Stated Goal: to feel better and then go home Progress towards PT goals: Progressing toward goals    Frequency    Min 2X/week (Pt very motivated to work with PT as much as possible)      PT Plan Current plan remains appropriate    Co-evaluation              AM-PAC PT "6 Clicks" Mobility   Outcome Measure  Help needed turning from your back to your side while in a flat bed without using bedrails?: A Little Help needed moving from lying on your back to sitting on the side of a flat bed without using bedrails?: A Little Help needed moving to and from a bed to a chair (including a wheelchair)?: A Little Help needed standing up from a chair using your arms (e.g., wheelchair or bedside chair)?: A Little Help needed to walk  in hospital room?: A Lot Help needed climbing 3-5 steps with a railing? : A Lot 6 Click Score: 16    End of Session Equipment Utilized During Treatment: Gait belt Activity Tolerance: Patient tolerated treatment well Patient left: in chair;with call bell/phone within reach Nurse Communication: Mobility status PT Visit Diagnosis: Unsteadiness on feet (R26.81);Muscle weakness (generalized) (M62.81);Difficulty in walking, not elsewhere classified (R26.2)     Time: 4970-2637 PT Time Calculation (min) (ACUTE ONLY): 55 min  Charges:  $Gait Training: 8-22 mins $Therapeutic Exercise: 8-22 mins $Therapeutic Activity: 23-37 mins                    Zadie Cleverly, PTA   Jannet Askew 06/18/2021, 2:18 PM

## 2021-06-18 NOTE — TOC Progression Note (Signed)
Transition of Care [facility]) - Progression Note    Patient Details  Name: Daniel Leon MRN: [id number] Date of Birth: Jan 20, 1951  Transition of Care [facility]) CM/SW Contact  Chapman Fitch, RN Phone Number: 06/18/2021, 10:32 AM  Clinical Narrative:    Patient has decided to wait the 10 day quarantine Confirmed with Verlon Au at [facility] that patient can admit 11/6 pending insurance auth    Expected Discharge Plan: Skilled Nursing Facility Barriers to Discharge: Continued Medical Work up  Expected Discharge Plan and Services Expected Discharge Plan: Skilled Nursing Facility     Post Acute Care Choice:  (undecided at this time) Living arrangements for the past 2 months: Single Family Home                                       Social Determinants of Health (SDOH) Interventions    Readmission Risk Interventions No flowsheet data found.

## 2021-06-18 NOTE — Progress Notes (Signed)
PROGRESS NOTE    Daniel Leon  KWI:[id number] DOB: 02/11/1951 DOA: 06/12/2021 PCP: Valerie Roys, DO    Brief Narrative:  70 y.o. male with medical history significant of hypertension, diet-controlled diabetes, right bundle blockade, peripheral neuropathy, BPH, anemia, s/p of left BKA, CKD-3A, s/p of right second toe amputation due to to osteomyelitis, who presents with weakness and nausea.   Patient has history of right second toe osteomyelitis.  Patient is s/p of right second toe amputation on 06/07/2021.  Patient was initially treated with doxycycline.  He states that he has not been feeling well in the past several days.  Patient has severe generalized weakness, poor appetite, decreased oral intake.  He has nausea and dry heaves, no diarrhea or abdominal pain.  No fever or chills.  Patient has mild dry cough, no chest pain or shortness of breath.  Denies symptoms of UTI. He states that he does not have worsening pain in in the surgical site.  No active drainage noted.  Patient was seen by his podiatrist yesterday, who thought that his wound looks okay.  Patient was started on Bactrim.  On admission patient was seen in consultation by podiatry who did not feel that the toe was the source of sepsis.  No surgical intervention warranted.  CT imaging survey concerning for duodenitis.  Patient does report subjective improvement since initiation of antibiotics and IV fluids.  Still significantly weak and deconditioned.  Therapy is made recommendation for skilled nursing facility.  Patient is somewhat reluctant.  He is improving clinically.  10/27 no issues. Has decided to go to rehab and do quarantine for 10 days   Assessment & Plan:   Principal Problem:   Severe sepsis ([place]) Active Problems:   Essential hypertension   Normocytic anemia   UTI (urinary tract infection)   Cellulitis of second toe of right foot   CKD (chronic kidney disease), stage IIIa   Duodenitis   Elevated  troponin   Abnormal LFTs  Duodenitis Right lower extremity cellulitis Possible urinary tract infection Severe sepsis secondary to above Criteria met with leukocytosis, tachycardia, tachypnea, elevated lactic acid Initial hypotension resolved with IV fluid resuscitation Suspect that duodenitis is primary driver for infection No evidence of osteomyelitis No surgical intervention from podiatry standpoint 10/27 continue ciprofloxacin and Flagyl  Antiemetics pain control  Cultures today negative   Covid + Asx.  Will give Remdesivir iv 3 days, d/w pt and he was agreeable . Discussed risk/complications/reason.  Needs 10-day quarantine prior to going to [employer] which ends at 11 /6  Essential hypertension Continue lisinopril   Normocytic anemia Suspect anemia of chronic kidney disease H/h stable     Acute on CKD (chronic kidney disease), stage IIIa::  Renal function close to baseline.  Recent baseline creatinine 1.15-1.39, his creatinine is 1.36, BUN 26. 10/27 close to baseline Monitor    Abnormal liver function Patient has some mild abnormal liver function, likely due to sepsis. -Monitor   Elevated troponin:  Troponin level 60, 69, no chest pain, likely due to demand ischemia. -Echocardiogram reassuring.  Normal EF.  No valvulopathy.  Found with mild dilatation of aortic root and ascending aorta on echo Need to f/u with cards as outpt       DVT prophylaxis: SQ heparin Code Status: Full Family Communication: none at bedside Disposition Plan: Status is: Inpatient  Remains inpatient appropriate because: Severe sepsis secondary to likely duodenitis.  Sepsis physiology resolved.  Needs 10 days of quarantining since he is COVID-positive  prior to SNF      Level of care: Med-Surg  Consultants:  Podiatry  Procedures:  None  Antimicrobials: Ciprofloxacin Metronidazole   Subjective: No sob , cp, dizziness  Objective: Vitals:   06/17/21 1517  06/17/21 2016 06/17/21 2016 06/18/21 0427  BP: 133/85 (!) 165/74 (!) 165/74 134/77  Pulse: (!) 101 (!) 104 (!) 101 92  Resp:  _0 Temp: 97.6 F (36.4 C) 98.4 F (36.9 C) 98.4 F (36.9 C) 97.7 F (36.5 C)  TempSrc: Oral Oral Oral Oral  SpO2: 95% 90% 90% 91%  Weight:      Height:        Intake/Output Summary (Last 24 hours) at 06/18/2021 0827 Last data filed at 06/18/2021 0522 Gross per 24 hour  Intake 240 ml  Output 1350 ml  Net -1110 ml   Filed Weights   06/12/21 0121  Weight: 112.9 kg    Examination: Calm, NAD CTA no wheeze rales Regular S1-S2 no gallops Soft benign positive bowel sounds No edema Awake and oriented x3 Mood and affect appropriate in current setting   Data Reviewed: I have personally reviewed following labs and imaging studies  CBC: Recent Labs  Lab 06/12/21 0120 06/13/21 0503 06/15/21 0427 06/16/21 0502 06/17/21 0450 06/18/21 0646  WBC 24.2* 16.3* 10.2 10.4 10.7* 11.4*  NEUTROABS 22.8*  --  6.8 5.8 6.3 7.0  HGB 11.8* 9.7* 10.1* 10.9* 11.1* 10.8*  HCT 33.5* 29.0* 29.8* 31.5* 33.1* 32.7*  MCV 86.6 87.6 88.2 87.7 86.9 86.5  PLT 327 286 358 402* 403* 356*   Basic Metabolic Panel: Recent Labs  Lab 06/13/21 0503 06/15/21 0427 06/16/21 0502 06/17/21 0450 06/18/21 0646  NA 131* 133* 134* 134* 132*  K 4.1 3.9 3.9 4.0 4.3  CL 103 103 99 99 97*  CO2 _1 GLUCOSE 97 97 113* 125* 139*  BUN 29* _2 24*  CREATININE 1.38* 1.21 1.11 1.15 1.26*  CALCIUM 7.8* 8.5* 9.0 8.9 8.9   GFR: Estimated Creatinine Clearance: 73.9 mL/min (A) (by C-G formula based on SCr of 1.26 mg/dL (H)). Liver Function Tests: Recent Labs  Lab 06/12/21 0120  AST 50*  ALT 72*  ALKPHOS 156*  BILITOT 1.1  PROT 7.0  ALBUMIN 2.9*   No results for input(s): LIPASE, AMYLASE in the last 168 hours. No results for input(s): AMMONIA in the last 168 hours. Coagulation Profile: Recent Labs  Lab 06/12/21 1556  INR 1.3*   Cardiac Enzymes: No  results for input(s): CKTOTAL, CKMB, CKMBINDEX, TROPONINI in the last 168 hours. BNP (last 3 results) No results for input(s): PROBNP in the last 8760 hours. HbA1C: No results for input(s): HGBA1C in the last 72 hours. CBG: No results for input(s): GLUCAP in the last 168 hours.  Lipid Profile: No results for input(s): CHOL, HDL, LDLCALC, TRIG, CHOLHDL, LDLDIRECT in the last 72 hours.  Thyroid Function Tests: No results for input(s): TSH, T4TOTAL, FREET4, T3FREE, THYROIDAB in the last 72 hours. Anemia Panel: No results for input(s): VITAMINB12, FOLATE, FERRITIN, TIBC, IRON, RETICCTPCT in the last 72 hours. Sepsis Labs: Recent Labs  Lab 06/12/21 0120 06/12/21 0338 06/12/21 1556 06/15/21 0427  PROCALCITON  --   --  4.63 0.48  LATICACIDVEN 2.4* 1.8  --   --     Recent Results (from the past 240 hour(s))  Microscopic Examination     Status: Abnormal   Collection Time: 06/11/21  3:07 PM   BLD  Result Value Ref Range  Status   WBC, UA None seen 0 - 5 /hpf Final   RBC 0-2 0 - 2 /hpf Final   Epithelial Cells (non renal) None seen 0 - 10 /hpf Final   Casts Present (A) None seen /lpf Final   Cast Type Hyaline casts N/A Final   Crystals Present (A) N/A Final   Crystal Type Uric Acid N/A Final   Mucus, UA Present (A) Not Estab. Final   Bacteria, UA None seen None seen/Few Final  Urine Culture     Status: None   Collection Time: 06/11/21  3:53 PM   Specimen: Urine   UR  Result Value Ref Range Status   Urine Culture, Routine Final report  Final   Organism ID, Bacteria No growth  Final  Blood culture (routine x 2)     Status: None   Collection Time: 06/12/21  1:27 AM   Specimen: BLOOD  Result Value Ref Range Status   Specimen Description BLOOD LEFT FA  Final   Special Requests   Final    BOTTLES DRAWN AEROBIC AND ANAEROBIC Blood Culture adequate volume   Culture   Final    NO GROWTH 5 DAYS Performed at [facility], [address] [place], [place] [address]     Report Status 06/17/2021 FINAL  Final  Blood culture (routine x 2)     Status: None   Collection Time: 06/12/21  1:27 AM   Specimen: BLOOD  Result Value Ref Range Status   Specimen Description BLOOD LEFT HAND  Final   Special Requests   Final    BOTTLES DRAWN AEROBIC AND ANAEROBIC Blood Culture adequate volume   Culture   Final    NO GROWTH 5 DAYS Performed at [facility], [address] [place], [place] [address]    Report Status 06/17/2021 FINAL  Final  Resp Panel by RT-PCR (Flu A&B, Covid) Nasopharyngeal Swab     Status: None   Collection Time: 06/12/21  1:29 AM   Specimen: Nasopharyngeal Swab; Nasopharyngeal(NP) swabs in vial transport medium  Result Value Ref Range Status   SARS Coronavirus 2 by RT PCR NEGATIVE NEGATIVE Final    Comment: (NOTE) SARS-CoV-2 target nucleic acids are NOT DETECTED.  The SARS-CoV-2 RNA is generally detectable in upper respiratory specimens during the acute phase of infection. The lowest concentration of SARS-CoV-2 viral copies this assay can detect is 138 copies/mL. A negative result does not preclude SARS-Cov-2 infection and should not be used as the sole basis for treatment or other patient management decisions. A negative result may occur with  improper specimen collection/handling, submission of specimen other than nasopharyngeal swab, presence of viral mutation(s) within the areas targeted by this assay, and inadequate number of viral copies(<138 copies/mL). A negative result must be combined with clinical observations, patient history, and epidemiological information. The expected result is Negative.  Fact Sheet for Patients:  EntrepreneurPulse.com.au  Fact Sheet for Healthcare Providers:  IncredibleEmployment.be  This test is no t yet approved or cleared by the [place] FDA and  has been authorized for detection and/or diagnosis of SARS-CoV-2 by FDA under an Emergency Use  Authorization (EUA). This EUA will remain  in effect (meaning this test can be used) for the duration of the COVID-19 declaration under Section 564(b)(1) of the Act, 21 U.S.C.section 360bbb-3(b)(1), unless the authorization is terminated  or revoked sooner.       Influenza A by PCR NEGATIVE NEGATIVE Final   Influenza B by PCR NEGATIVE NEGATIVE Final  Comment: (NOTE) The Xpert Xpress SARS-CoV-2/FLU/RSV plus assay is intended as an aid in the diagnosis of influenza from Nasopharyngeal swab specimens and should not be used as a sole basis for treatment. Nasal washings and aspirates are unacceptable for Xpert Xpress SARS-CoV-2/FLU/RSV testing.  Fact Sheet for Patients: EntrepreneurPulse.com.au  Fact Sheet for Healthcare Providers: IncredibleEmployment.be  This test is not yet approved or cleared by the [place] FDA and has been authorized for detection and/or diagnosis of SARS-CoV-2 by FDA under an Emergency Use Authorization (EUA). This EUA will remain in effect (meaning this test can be used) for the duration of the COVID-19 declaration under Section 564(b)(1) of the Act, 21 U.S.C. section 360bbb-3(b)(1), unless the authorization is terminated or revoked.  Performed at [facility], [address] [place], [place] [address]   Urine Culture     Status: None   Collection Time: 06/12/21  3:36 AM   Specimen: Urine, Random  Result Value Ref Range Status   Specimen Description   Final    URINE, RANDOM Performed at [facility], [address], [place], [place] [address]    Special Requests   Final    NONE Performed at [facility], [address], [place], [place] [address]    Culture   Final    NO GROWTH Performed at [facility], [place] [address], [place],  [address]    Report Status 06/13/2021 FINAL  Final  Resp Panel by RT-PCR (Flu A&B, Covid) Nasopharyngeal Swab     Status: Abnormal    Collection Time: 06/17/21  2:09 PM   Specimen: Nasopharyngeal Swab; Nasopharyngeal(NP) swabs in vial transport medium  Result Value Ref Range Status   SARS Coronavirus 2 by RT PCR POSITIVE (A) NEGATIVE Final    Comment: CRITICAL RESULT CALLED TO, READ BACK BY AND VERIFIED WITH: SUSAN HINO 06/17/21 1528 MU (NOTE) SARS-CoV-2 target nucleic acids are DETECTED.  The SARS-CoV-2 RNA is generally detectable in upper respiratory specimens during the acute phase of infection. Positive results are indicative of the presence of the identified virus, but do not rule out bacterial infection or co-infection with other pathogens not detected by the test. Clinical correlation with patient history and other diagnostic information is necessary to determine patient infection status. The expected result is Negative.  Fact Sheet for Patients: EntrepreneurPulse.com.au  Fact Sheet for Healthcare Providers: IncredibleEmployment.be  This test is not yet approved or cleared by the [place] FDA and  has been authorized for detection and/or diagnosis of SARS-CoV-2 by FDA under an Emergency Use Authorization (EUA).  This EUA will remain in effect (meaning this test can  be used) for the duration of  the COVID-19 declaration under Section 564(b)(1) of the Act, 21 U.S.C. section 360bbb-3(b)(1), unless the authorization is terminated or revoked sooner.     Influenza A by PCR NEGATIVE NEGATIVE Final   Influenza B by PCR NEGATIVE NEGATIVE Final    Comment: (NOTE) The Xpert Xpress SARS-CoV-2/FLU/RSV plus assay is intended as an aid in the diagnosis of influenza from Nasopharyngeal swab specimens and should not be used as a sole basis for treatment. Nasal washings and aspirates are unacceptable for Xpert Xpress SARS-CoV-2/FLU/RSV testing.  Fact Sheet for Patients: EntrepreneurPulse.com.au  Fact Sheet for Healthcare  Providers: IncredibleEmployment.be  This test is not yet approved or cleared by the [place] FDA and has been authorized for detection and/or diagnosis of SARS-CoV-2 by FDA under an Emergency Use Authorization (EUA). This EUA will remain in effect (  meaning this test can be used) for the duration of the COVID-19 declaration under Section 564(b)(1) of the Act, 21 U.S.C. section 360bbb-3(b)(1), unless the authorization is terminated or revoked.  Performed at [facility], [address] [place], [place] [address]          Radiology Studies: No results found.      Scheduled Meds:  cholecalciferol  5,000 Units Oral Daily   ciprofloxacin  500 mg Oral BID   feeding supplement  237 mL Oral BID BM   gabapentin  400 mg Oral Daily   gabapentin  800 mg Oral QHS   heparin  5,000 Units Subcutaneous Q8H   lisinopril  15 mg Oral Daily   metroNIDAZOLE  500 mg Oral Q8H   multivitamin with minerals  1 tablet Oral Daily   pantoprazole  40 mg Oral Daily   Continuous Infusions:     LOS: 6 days    Time spent: 35 minutes with more than 50% COC    Nolberto Hanlon, MD [facility]   If 7PM-7AM, please contact night-coverage  06/18/2021, 8:27 AM

## 2021-06-19 ENCOUNTER — Telehealth: Payer: Self-pay | Admitting: Podiatry

## 2021-06-19 DIAGNOSIS — A419 Sepsis, unspecified organism: Secondary | ICD-10-CM | POA: Diagnosis not present

## 2021-06-19 DIAGNOSIS — R6520 Severe sepsis without septic shock: Secondary | ICD-10-CM | POA: Diagnosis not present

## 2021-06-19 LAB — CBC WITH DIFFERENTIAL/PLATELET
Abs Immature Granulocytes: 0.93 10*3/uL — ABNORMAL HIGH (ref 0.00–0.07)
Basophils Absolute: 0.1 10*3/uL (ref 0.0–0.1)
Basophils Relative: 1 %
Eosinophils Absolute: 0.2 10*3/uL (ref 0.0–0.5)
Eosinophils Relative: 2 %
HCT: 33.6 % — ABNORMAL LOW (ref 39.0–52.0)
Hemoglobin: 11.3 g/dL — ABNORMAL LOW (ref 13.0–17.0)
Immature Granulocytes: 10 %
Lymphocytes Relative: 16 %
Lymphs Abs: 1.5 10*3/uL (ref 0.7–4.0)
MCH: 29.3 pg (ref 26.0–34.0)
MCHC: 33.6 g/dL (ref 30.0–36.0)
MCV: 87 fL (ref 80.0–100.0)
Monocytes Absolute: 0.9 10*3/uL (ref 0.1–1.0)
Monocytes Relative: 9 %
Neutro Abs: 6.1 10*3/uL (ref 1.7–7.7)
Neutrophils Relative %: 62 %
Platelets: 438 10*3/uL — ABNORMAL HIGH (ref 150–400)
RBC: 3.86 MIL/uL — ABNORMAL LOW (ref 4.22–5.81)
RDW: 14.6 % (ref 11.5–15.5)
Smear Review: NORMAL
WBC: 9.8 10*3/uL (ref 4.0–10.5)
nRBC: 0 % (ref 0.0–0.2)

## 2021-06-19 LAB — BASIC METABOLIC PANEL
Anion gap: 8 (ref 5–15)
BUN: 25 mg/dL — ABNORMAL HIGH (ref 8–23)
CO2: 26 mmol/L (ref 22–32)
Calcium: 8.8 mg/dL — ABNORMAL LOW (ref 8.9–10.3)
Chloride: 98 mmol/L (ref 98–111)
Creatinine, Ser: 1.35 mg/dL — ABNORMAL HIGH (ref 0.61–1.24)
GFR, Estimated: 56 mL/min — ABNORMAL LOW (ref >60)
Glucose, Bld: 154 mg/dL — ABNORMAL HIGH (ref 70–99)
Potassium: 4.5 mmol/L (ref 3.5–5.1)
Sodium: 132 mmol/L — ABNORMAL LOW (ref 135–145)

## 2021-06-19 MED ORDER — RISAQUAD PO CAPS
2.0000 | ORAL_CAPSULE | Freq: Every day | ORAL | Status: DC
  Administered 2021-06-19 – 2021-06-23 (×5): 2 via ORAL
  Filled 2021-06-19 (×5): qty 2

## 2021-06-19 NOTE — Telephone Encounter (Signed)
Patients wife called her and her husband are both positive for covid. Paient has an appointment on Thursday but the wife states they told her they need to quarantine till the 6th. Patients wife states pt needs to get stitches removed. What should they do? Please call patients wife at [phone].

## 2021-06-19 NOTE — Progress Notes (Signed)
PROGRESS NOTE    Daniel Leon  ASN:[id number] DOB: May 04, 1951 DOA: 06/12/2021 PCP: Valerie Roys, DO    Brief Narrative:  70 y.o. male with medical history significant of hypertension, diet-controlled diabetes, right bundle blockade, peripheral neuropathy, BPH, anemia, s/p of left BKA, CKD-3A, s/p of right second toe amputation due to to osteomyelitis, who presents with weakness and nausea.   Patient has history of right second toe osteomyelitis.  Patient is s/p of right second toe amputation on 06/07/2021.  Patient was initially treated with doxycycline.  He states that he has not been feeling well in the past several days.  Patient has severe generalized weakness, poor appetite, decreased oral intake.  He has nausea and dry heaves, no diarrhea or abdominal pain.  No fever or chills.  Patient has mild dry cough, no chest pain or shortness of breath.  Denies symptoms of UTI. He states that he does not have worsening pain in in the surgical site.  No active drainage noted.  Patient was seen by his podiatrist yesterday, who thought that his wound looks okay.  Patient was started on Bactrim.  On admission patient was seen in consultation by podiatry who did not feel that the toe was the source of sepsis.  No surgical intervention warranted.  CT imaging survey concerning for duodenitis.  Patient does report subjective improvement since initiation of antibiotics and IV fluids.  Still significantly weak and deconditioned.  Therapy is made recommendation for skilled nursing facility.  Patient is somewhat reluctant.  He is improving clinically.  10/28 awaiting 10 day quarantine for rehab    Assessment & Plan:   Principal Problem:   Severe sepsis ([place]) Active Problems:   Essential hypertension   Normocytic anemia   UTI (urinary tract infection)   Cellulitis of second toe of right foot   CKD (chronic kidney disease), stage IIIa   Duodenitis   Elevated troponin   Abnormal  LFTs  Duodenitis Right lower extremity cellulitis Possible urinary tract infection Severe sepsis secondary to above Criteria met with leukocytosis, tachycardia, tachypnea, elevated lactic acid Initial hypotension resolved with IV fluid resuscitation Suspect that duodenitis is primary driver for infection No evidence of osteomyelitis No surgical intervention from podiatry standpoint Continue ciprofloxacin and Flagyl 7-day course  Cultures today negative   Covid + Asx.  Will do 5-day IV remdesivir Needs quarantining 10 days prior to going to [employer] which ends at 11/6    Essential hypertension Continue lisinopril   Normocytic anemia Suspect anemia of chronic kidney disease H&H stable     Acute on CKD (chronic kidney disease), stage IIIa::  Renal function close to baseline.  Recent baseline creatinine 1.15-1.39, his creatinine is 1.36, BUN 26. close to baseline Monitor    Abnormal liver function Patient has some mild abnormal liver function, likely due to sepsis. -Monitor   Elevated troponin:  Troponin level 60, 69, no chest pain, likely due to demand ischemia. -Echocardiogram reassuring.  Normal EF.  No valvulopathy.  Found with mild dilatation of aortic root and ascending aorta on echo Need to f/u with cards as outpt       DVT prophylaxis: SQ heparin Code Status: Full Family Communication: none at bedside Disposition Plan: Status is: Inpatient  Remains inpatient appropriate because: Severe sepsis secondary to likely duodenitis.  Sepsis physiology resolved.  Needs 10 days of quarantining since he is COVID-positive prior to SNF      Level of care: Med-Surg  Consultants:  Podiatry  Procedures:  None  Antimicrobials: Ciprofloxacin Metronidazole   Subjective: Denies any shortness of breath, chest pain, diarrhea, or any other symptoms     Objective: Vitals:   06/18/21 0427 06/18/21 0842 06/18/21 1616 06/18/21 2030  BP: 134/77  139/81 129/72 136/69  Pulse: 92 84 87 90  Resp: 17 18 (!) 21 16  Temp: 97.7 F (36.5 C) 97.9 F (36.6 C) 98 F (36.7 C) 98.5 F (36.9 C)  TempSrc: Oral   Oral  SpO2: 91% 92% 95% 94%  Weight:      Height:        Intake/Output Summary (Last 24 hours) at 06/19/2021 0852 Last data filed at 06/19/2021 0500 Gross per 24 hour  Intake 326.13 ml  Output 400 ml  Net -73.87 ml   Filed Weights   06/12/21 0121  Weight: 112.9 kg    Examination: Calm nad Cta no w/r Regular s1s2 no gallops Benign +bs  No edema aaxoxo3   Data Reviewed: I have personally reviewed following labs and imaging studies  CBC: Recent Labs  Lab 06/15/21 0427 06/16/21 0502 06/17/21 0450 06/18/21 0646 06/19/21 0718  WBC 10.2 10.4 10.7* 11.4* 9.8  NEUTROABS 6.8 5.8 6.3 7.0 PENDING  HGB 10.1* 10.9* 11.1* 10.8* 11.3*  HCT 29.8* 31.5* 33.1* 32.7* 33.6*  MCV 88.2 87.7 86.9 86.5 87.0  PLT 358 402* 403* 414* 654*   Basic Metabolic Panel: Recent Labs  Lab 06/15/21 0427 06/16/21 0502 06/17/21 0450 06/18/21 0646 06/19/21 0718  NA 133* 134* 134* 132* 132*  K 3.9 3.9 4.0 4.3 4.5  CL 103 99 99 97* 98  CO2 '24 24 27 27 26  ' GLUCOSE 97 113* 125* 139* 154*  BUN '22 23 22 ' 24* 25*  CREATININE 1.21 1.11 1.15 1.26* 1.35*  CALCIUM 8.5* 9.0 8.9 8.9 8.8*   GFR: Estimated Creatinine Clearance: 69 mL/min (A) (by C-G formula based on SCr of 1.35 mg/dL (H)). Liver Function Tests: No results for input(s): AST, ALT, ALKPHOS, BILITOT, PROT, ALBUMIN in the last 168 hours.  No results for input(s): LIPASE, AMYLASE in the last 168 hours. No results for input(s): AMMONIA in the last 168 hours. Coagulation Profile: Recent Labs  Lab 06/12/21 1556  INR 1.3*   Cardiac Enzymes: No results for input(s): CKTOTAL, CKMB, CKMBINDEX, TROPONINI in the last 168 hours. BNP (last 3 results) No results for input(s): PROBNP in the last 8760 hours. HbA1C: No results for input(s): HGBA1C in the last 72 hours. CBG: No results  for input(s): GLUCAP in the last 168 hours.  Lipid Profile: No results for input(s): CHOL, HDL, LDLCALC, TRIG, CHOLHDL, LDLDIRECT in the last 72 hours.  Thyroid Function Tests: No results for input(s): TSH, T4TOTAL, FREET4, T3FREE, THYROIDAB in the last 72 hours. Anemia Panel: No results for input(s): VITAMINB12, FOLATE, FERRITIN, TIBC, IRON, RETICCTPCT in the last 72 hours. Sepsis Labs: Recent Labs  Lab 06/12/21 1556 06/15/21 0427  PROCALCITON 4.63 0.48    Recent Results (from the past 240 hour(s))  Microscopic Examination     Status: Abnormal   Collection Time: 06/11/21  3:07 PM   BLD  Result Value Ref Range Status   WBC, UA None seen 0 - 5 /hpf Final   RBC 0-2 0 - 2 /hpf Final   Epithelial Cells (non renal) None seen 0 - 10 /hpf Final   Casts Present (A) None seen /lpf Final   Cast Type Hyaline casts N/A Final   Crystals Present (A) N/A Final   Crystal Type Uric Acid N/A Final  Mucus, UA Present (A) Not Estab. Final   Bacteria, UA None seen None seen/Few Final  Urine Culture     Status: None   Collection Time: 06/11/21  3:53 PM   Specimen: Urine   UR  Result Value Ref Range Status   Urine Culture, Routine Final report  Final   Organism ID, Bacteria No growth  Final  Blood culture (routine x 2)     Status: None   Collection Time: 06/12/21  1:27 AM   Specimen: BLOOD  Result Value Ref Range Status   Specimen Description BLOOD LEFT FA  Final   Special Requests   Final    BOTTLES DRAWN AEROBIC AND ANAEROBIC Blood Culture adequate volume   Culture   Final    NO GROWTH 5 DAYS Performed at [facility], [address], [place], [place] [address]    Report Status 06/17/2021 FINAL  Final  Blood culture (routine x 2)     Status: None   Collection Time: 06/12/21  1:27 AM   Specimen: BLOOD  Result Value Ref Range Status   Specimen Description BLOOD LEFT HAND  Final   Special Requests   Final    BOTTLES DRAWN AEROBIC AND ANAEROBIC Blood Culture adequate volume    Culture   Final    NO GROWTH 5 DAYS Performed at [facility], [address] [place], [place] [address]    Report Status 06/17/2021 FINAL  Final  Resp Panel by RT-PCR (Flu A&B, Covid) Nasopharyngeal Swab     Status: None   Collection Time: 06/12/21  1:29 AM   Specimen: Nasopharyngeal Swab; Nasopharyngeal(NP) swabs in vial transport medium  Result Value Ref Range Status   SARS Coronavirus 2 by RT PCR NEGATIVE NEGATIVE Final    Comment: (NOTE) SARS-CoV-2 target nucleic acids are NOT DETECTED.  The SARS-CoV-2 RNA is generally detectable in upper respiratory specimens during the acute phase of infection. The lowest concentration of SARS-CoV-2 viral copies this assay can detect is 138 copies/mL. A negative result does not preclude SARS-Cov-2 infection and should not be used as the sole basis for treatment or other patient management decisions. A negative result may occur with  improper specimen collection/handling, submission of specimen other than nasopharyngeal swab, presence of viral mutation(s) within the areas targeted by this assay, and inadequate number of viral copies(<138 copies/mL). A negative result must be combined with clinical observations, patient history, and epidemiological information. The expected result is Negative.  Fact Sheet for Patients:  EntrepreneurPulse.com.au  Fact Sheet for Healthcare Providers:  IncredibleEmployment.be  This test is no t yet approved or cleared by the [place] FDA and  has been authorized for detection and/or diagnosis of SARS-CoV-2 by FDA under an Emergency Use Authorization (EUA). This EUA will remain  in effect (meaning this test can be used) for the duration of the COVID-19 declaration under Section 564(b)(1) of the Act, 21 U.S.C.section 360bbb-3(b)(1), unless the authorization is terminated  or revoked sooner.       Influenza A by PCR NEGATIVE NEGATIVE Final   Influenza B  by PCR NEGATIVE NEGATIVE Final    Comment: (NOTE) The Xpert Xpress SARS-CoV-2/FLU/RSV plus assay is intended as an aid in the diagnosis of influenza from Nasopharyngeal swab specimens and should not be used as a sole basis for treatment. Nasal washings and aspirates are unacceptable for Xpert Xpress SARS-CoV-2/FLU/RSV testing.  Fact Sheet for Patients: EntrepreneurPulse.com.au  Fact Sheet for Healthcare Providers: IncredibleEmployment.be  This test is not yet approved or  cleared by the [place] and has been authorized for detection and/or diagnosis of SARS-CoV-2 by FDA under an Emergency Use Authorization (EUA). This EUA will remain in effect (meaning this test can be used) for the duration of the COVID-19 declaration under Section 564(b)(1) of the Act, 21 U.S.C. section 360bbb-3(b)(1), unless the authorization is terminated or revoked.  Performed at [facility], [address] [place], [place] [address]   Urine Culture     Status: None   Collection Time: 06/12/21  3:36 AM   Specimen: Urine, Random  Result Value Ref Range Status   Specimen Description   Final    URINE, RANDOM Performed at [facility], [address] [place], [place] [address]    Special Requests   Final    NONE Performed at [facility], [address], [place], [place] [address]    Culture   Final    NO GROWTH Performed at [facility], [place] [address], [place], [place] [address]    Report Status 06/13/2021 FINAL  Final  Resp Panel by RT-PCR (Flu A&B, Covid) Nasopharyngeal Swab     Status: Abnormal   Collection Time: 06/17/21  2:09 PM   Specimen: Nasopharyngeal Swab; Nasopharyngeal(NP) swabs in vial transport medium  Result Value Ref Range Status   SARS Coronavirus 2 by RT PCR POSITIVE (A) NEGATIVE Final    Comment: CRITICAL RESULT CALLED TO, READ BACK BY AND VERIFIED WITH: SUSAN HINO 06/17/21 1528  MU (NOTE) SARS-CoV-2 target nucleic acids are DETECTED.  The SARS-CoV-2 RNA is generally detectable in upper respiratory specimens during the acute phase of infection. Positive results are indicative of the presence of the identified virus, but do not rule out bacterial infection or co-infection with other pathogens not detected by the test. Clinical correlation with patient history and other diagnostic information is necessary to determine patient infection status. The expected result is Negative.  Fact Sheet for Patients: EntrepreneurPulse.com.au  Fact Sheet for Healthcare Providers: IncredibleEmployment.be  This test is not yet approved or cleared by the [place] FDA and  has been authorized for detection and/or diagnosis of SARS-CoV-2 by FDA under an Emergency Use Authorization (EUA).  This EUA will remain in effect (meaning this test can  be used) for the duration of  the COVID-19 declaration under Section 564(b)(1) of the Act, 21 U.S.C. section 360bbb-3(b)(1), unless the authorization is terminated or revoked sooner.     Influenza A by PCR NEGATIVE NEGATIVE Final   Influenza B by PCR NEGATIVE NEGATIVE Final    Comment: (NOTE) The Xpert Xpress SARS-CoV-2/FLU/RSV plus assay is intended as an aid in the diagnosis of influenza from Nasopharyngeal swab specimens and should not be used as a sole basis for treatment. Nasal washings and aspirates are unacceptable for Xpert Xpress SARS-CoV-2/FLU/RSV testing.  Fact Sheet for Patients: EntrepreneurPulse.com.au  Fact Sheet for Healthcare Providers: IncredibleEmployment.be  This test is not yet approved or cleared by the [place] FDA and has been authorized for detection and/or diagnosis of SARS-CoV-2 by FDA under an Emergency Use Authorization (EUA). This EUA will remain in effect (meaning this test can be used) for the duration of the COVID-19  declaration under Section 564(b)(1) of the Act, 21 U.S.C. section 360bbb-3(b)(1), unless the authorization is terminated or revoked.  Performed at [facility], [address] [place], [place] [address]          Radiology Studies: No results found.      Scheduled Meds:  cholecalciferol  5,000 Units Oral Daily   ciprofloxacin  500 mg Oral BID   feeding supplement  237 mL Oral BID BM   gabapentin  400 mg Oral Daily   gabapentin  800 mg Oral QHS   heparin  5,000 Units Subcutaneous Q8H   lisinopril  15 mg Oral Daily   metroNIDAZOLE  500 mg Oral Q8H   multivitamin with minerals  1 tablet Oral Daily   pantoprazole  40 mg Oral Daily   Continuous Infusions:  remdesivir 100 mg in NS 100 mL        LOS: 7 days    Time spent: 35 minutes with more than 50% COC    Nolberto Hanlon, MD [facility]   If 7PM-7AM, please contact night-coverage  06/19/2021, 8:52 AM

## 2021-06-19 NOTE — Progress Notes (Signed)
Patient wanting to ambulate in room to bathroom independently with assistance with wife. I informed him that we would like for him to call us for assistance but he says he feels confident with the help from his wife and does not want to call us every time. I advised him to please call.

## 2021-06-19 NOTE — Progress Notes (Signed)
Occupational Therapy Treatment Patient Details Name: Daniel Leon MRN: [id number] DOB: [birth year] Today's Date: 06/19/2021   History of present illness 70 y.o. male with medical history significant of hypertension, diet-controlled diabetes, right bundle blockade, peripheral neuropathy, BPH, anemia, s/p of left BKA, CKD, s/p of right second toe amputation (10/16), who presented to [facility] with weakness and nausea. Pt admitted for sepsis secondary to duodenitis   OT comments  Daniel Leon seen for OT treatment on this date. Upon arrival to room pt awake/alert. Pt seated in chair with family present. Pt agreeable to tx.  Pt requires close SUP + RW for functional t/f. Pt close SUP + RW for UB grooming tasks, toothwashing, facewashing, standing, reaching outside of BOS,  sinkside ~ 5 min. Pt close SUP + RW for simulated household naviagtion task. Pt demonstrating sit<>stand from elevated surfaces but not low surfaces as present in household. Pt instructed in spirometer use and pt return demonstrated instruction provided. Pt making good progress toward goals. Pt continues to benefit from skilled OT services to maximize return to PLOF and minimize risk of future falls, injury, caregiver burden, and readmission. Will continue to follow POC. Discharge recommendation remains appropriate 2/2 decreased activity tolerance, plan to reassess d/c recs next session.     Recommendations for follow up therapy are one component of a multi-disciplinary discharge planning process, led by the attending physician.  Recommendations may be updated based on patient status, additional functional criteria and insurance authorization.    Follow Up Recommendations  Skilled nursing-short term rehab (<3 hours/day) may progress   Assistance Recommended at Discharge Intermittent Supervision/Assistance  Equipment Recommendations  None recommended by OT    Recommendations for Other Services      Precautions / Restrictions  Precautions Precautions: Fall Restrictions Weight Bearing Restrictions: Yes RLE Weight Bearing: Weight bearing as tolerated Other Position/Activity Restrictions: in post op shoe       Mobility Bed Mobility Overal bed mobility: Needs Assistance Bed Mobility: Supine to Sit;Sit to Supine Rolling: Modified independent (Device/Increase time)   Supine to sit: Supervision     General bed mobility comments: Pt received/left in chair.    Transfers Overall transfer level: Needs assistance Equipment used: Rolling walker (2 wheels) Transfers: Sit to/from Stand Sit to Stand: Supervision;From elevated surface           General transfer comment: Pt continues to improve functionally     Balance Overall balance assessment: Needs assistance Sitting-balance support: No upper extremity supported;Feet supported Sitting balance-Leahy Scale: Good     Standing balance support: No upper extremity supported;During functional activity Standing balance-Leahy Scale: Fair                             ADL either performed or assessed with clinical judgement   ADL Overall ADL's : Needs assistance/impaired                                       General ADL Comments: Pt requires SUP + RW functional t/f. Pt SUP + RW for UB grooming tasks, toothwashing, facewashing, standing, reaching outside of BOS,  sinkside ~ 5 min. Pt SUP + RW for simulated household naviagtion task.     Vision       Perception     Praxis      Cognition Arousal/Alertness: Awake/alert Behavior During Therapy: WFL for tasks assessed/performed Overall  Cognitive Status: Within Functional Limits for tasks assessed                                 General Comments:  (Pt feeling a little foggy in all positions)          Exercises Exercises: Other exercises Other Exercises Other Exercises: Pt and family educ re: OT role, d/c recs, spirometer use, falls pcns Other Exercises:  sit<>stand, toothbrushing, facewashing, simulated household navigation task   Shoulder Instructions       General Comments      Pertinent Vitals/ Pain       Pain Assessment: No/denies pain Faces Pain Scale: Hurts a little bit  Home Living                                          Prior Functioning/Environment              Frequency  Min 2X/week        Progress Toward Goals  OT Goals(current goals can now be found in the care plan section)  Progress towards OT goals: Progressing toward goals  Acute Rehab OT Goals OT Goal Formulation: With patient Time For Goal Achievement: 06/28/21 Potential to Achieve Goals: Good ADL Goals Pt Will Perform Grooming: with min guard assist;standing Pt Will Perform Lower Body Dressing: with min assist;sit to/from stand Pt Will Transfer to Toilet: with min assist;stand pivot transfer;bedside commode  Plan Discharge plan remains appropriate;Frequency remains appropriate    Co-evaluation                 AM-PAC OT "6 Clicks" Daily Activity     Outcome Measure   Help from another person eating meals?: None Help from another person taking care of personal grooming?: A Little Help from another person toileting, which includes using toliet, bedpan, or urinal?: A Little Help from another person bathing (including washing, rinsing, drying)?: A Lot Help from another person to put on and taking off regular upper body clothing?: None Help from another person to put on and taking off regular lower body clothing?: A Little 6 Click Score: 19    End of Session Equipment Utilized During Treatment: Rolling walker (2 wheels)  OT Visit Diagnosis: Other abnormalities of gait and mobility (R26.89);Muscle weakness (generalized) (M62.81)   Activity Tolerance Patient tolerated treatment well   Patient Left in chair;with nursing/sitter in room;with family/visitor present   Nurse Communication Other (comment);Mobility status  (can wife ambulate with him in room? nurse in room to answer)        Time: 1007-1219 OT Time Calculation (min): 25 min  Charges: OT General Charges $OT Visit: 1 Visit OT Treatments $Self Care/Home Management : 23-37 mins  [place] [employer], Adine Madura 06/19/2021, 4:02 PM

## 2021-06-20 DIAGNOSIS — A419 Sepsis, unspecified organism: Secondary | ICD-10-CM | POA: Diagnosis not present

## 2021-06-20 DIAGNOSIS — R6520 Severe sepsis without septic shock: Secondary | ICD-10-CM | POA: Diagnosis not present

## 2021-06-20 LAB — CREATININE, SERUM
Creatinine, Ser: 1.28 mg/dL — ABNORMAL HIGH (ref 0.61–1.24)
GFR, Estimated: 60 mL/min — ABNORMAL LOW (ref >60)

## 2021-06-20 NOTE — Progress Notes (Signed)
PROGRESS NOTE    Daniel Leon  NGE:[id number] DOB: Oct 16, 1950 DOA: 06/12/2021 PCP: Valerie Roys, DO    Brief Narrative:  70 y.o. male with medical history significant of hypertension, diet-controlled diabetes, right bundle blockade, peripheral neuropathy, BPH, anemia, s/p of left BKA, CKD-3A, s/p of right second toe amputation due to to osteomyelitis, who presents with weakness and nausea.   Patient has history of right second toe osteomyelitis.  Patient is s/p of right second toe amputation on 06/07/2021.  Patient was initially treated with doxycycline.  He states that he has not been feeling well in the past several days.  Patient has severe generalized weakness, poor appetite, decreased oral intake.  He has nausea and dry heaves, no diarrhea or abdominal pain.  No fever or chills.  Patient has mild dry cough, no chest pain or shortness of breath.  Denies symptoms of UTI. He states that he does not have worsening pain in in the surgical site.  No active drainage noted.  Patient was seen by his podiatrist yesterday, who thought that his wound looks okay.  Patient was started on Bactrim.  On admission patient was seen in consultation by podiatry who did not feel that the toe was the source of sepsis.  No surgical intervention warranted.  CT imaging survey concerning for duodenitis.  Patient does report subjective improvement since initiation of antibiotics and IV fluids.  Still significantly weak and deconditioned.  Therapy is made recommendation for skilled nursing facility.  Patient is somewhat reluctant.  He is improving clinically.  10/28 awaiting 10 day quarantine for rehab 10/29 has no shortness of breath, chest pain, cough    Assessment & Plan:   Principal Problem:   Severe sepsis ([place]) Active Problems:   Essential hypertension   Normocytic anemia   UTI (urinary tract infection)   Cellulitis of second toe of right foot   CKD (chronic kidney disease), stage IIIa    Duodenitis   Elevated troponin   Abnormal LFTs  Duodenitis Right lower extremity cellulitis Possible urinary tract infection Severe sepsis secondary to above Criteria met with leukocytosis, tachycardia, tachypnea, elevated lactic acid Initial hypotension resolved with IV fluid resuscitation Suspect that duodenitis is primary driver for infection No evidence of osteomyelitis No surgical intervention from podiatry standpoint 10/29 continue ciprofloxacin and Flagyl to complete 7-day course  Culture to date negative     Covid + Currently asymptomatic Continue 5-day IV remdesivir Continue quarantining 10 days prior to going to [facility] which ends on 11/6 I-S      Essential hypertension Continue lisinopril   Normocytic anemia Suspect anemia of chronic kidney disease Stable H&H     Acute on CKD (chronic kidney disease), stage IIIa::  Renal function close to baseline.  Recent baseline creatinine 1.15-1.39, his creatinine is 1.36, BUN 26. 10/29 creatinine down to 1.28   Abnormal liver function Patient has some mild abnormal liver function, likely due to sepsis. -Monitor   Elevated troponin:  Troponin level 60, 69, no chest pain, likely due to demand ischemia. -Echocardiogram reassuring.  Normal EF.  No valvulopathy.  Found with mild dilatation of aortic root and ascending aorta on echo Need to f/u with cards as outpt       DVT prophylaxis: SQ heparin Code Status: Full Family Communication: none at bedside Disposition Plan: Status is: Inpatient  Remains inpatient appropriate because: Severe sepsis secondary to likely duodenitis.  Sepsis physiology resolved.  Needs 10 days of quarantining since he is COVID-positive prior to SNF  Level of care: Med-Surg  Consultants:  Podiatry  Procedures:  None  Antimicrobials: Ciprofloxacin Metronidazole   Subjective: no complaints of shortness of breath, chest pain or  cough      Objective: Vitals:   06/19/21 1121 06/19/21 2003 06/20/21 0303 06/20/21 0835  BP: 120/66 128/72 121/69 139/78  Pulse: 98 93 87 80  Resp: _0 Temp: 97.8 F (36.6 C) 98.1 F (36.7 C)  97.9 F (36.6 C)  TempSrc: Oral   Oral  SpO2: 97% 96% 93% 91%  Weight:      Height:        Intake/Output Summary (Last 24 hours) at 06/20/2021 0846 Last data filed at 06/20/2021 0407 Gross per 24 hour  Intake 460 ml  Output 2501 ml  Net -2041 ml   Filed Weights   06/12/21 0121  Weight: 112.9 kg    Examination: Calm, NAD  CTA no wheezing Regular S1-S2 Soft benign positive bowel sounds Right lower extremity dressing in place Awake oriented x3 Appropriate in current setting     Data Reviewed: I have personally reviewed following labs and imaging studies  CBC: Recent Labs  Lab 06/15/21 0427 06/16/21 0502 06/17/21 0450 06/18/21 0646 06/19/21 0718  WBC 10.2 10.4 10.7* 11.4* 9.8  NEUTROABS 6.8 5.8 6.3 7.0 6.1  HGB 10.1* 10.9* 11.1* 10.8* 11.3*  HCT 29.8* 31.5* 33.1* 32.7* 33.6*  MCV 88.2 87.7 86.9 86.5 87.0  PLT 358 402* 403* 414* 161*   Basic Metabolic Panel: Recent Labs  Lab 06/15/21 0427 06/16/21 0502 06/17/21 0450 06/18/21 0646 06/19/21 0718 06/20/21 0459  NA 133* 134* 134* 132* 132*  --   K 3.9 3.9 4.0 4.3 4.5  --   CL 103 99 99 97* 98  --   CO2 _1 --   GLUCOSE 97 113* 125* 139* 154*  --   BUN _2 24* 25*  --   CREATININE 1.21 1.11 1.15 1.26* 1.35* 1.28*  CALCIUM 8.5* 9.0 8.9 8.9 8.8*  --    GFR: Estimated Creatinine Clearance: 72.8 mL/min (A) (by C-G formula based on SCr of 1.28 mg/dL (H)). Liver Function Tests: No results for input(s): AST, ALT, ALKPHOS, BILITOT, PROT, ALBUMIN in the last 168 hours.  No results for input(s): LIPASE, AMYLASE in the last 168 hours. No results for input(s): AMMONIA in the last 168 hours. Coagulation Profile: No results for input(s): INR, PROTIME in the last 168 hours.  Cardiac  Enzymes: No results for input(s): CKTOTAL, CKMB, CKMBINDEX, TROPONINI in the last 168 hours. BNP (last 3 results) No results for input(s): PROBNP in the last 8760 hours. HbA1C: No results for input(s): HGBA1C in the last 72 hours. CBG: No results for input(s): GLUCAP in the last 168 hours.  Lipid Profile: No results for input(s): CHOL, HDL, LDLCALC, TRIG, CHOLHDL, LDLDIRECT in the last 72 hours.  Thyroid Function Tests: No results for input(s): TSH, T4TOTAL, FREET4, T3FREE, THYROIDAB in the last 72 hours. Anemia Panel: No results for input(s): VITAMINB12, FOLATE, FERRITIN, TIBC, IRON, RETICCTPCT in the last 72 hours. Sepsis Labs: Recent Labs  Lab 06/15/21 0427  PROCALCITON 0.48    Recent Results (from the past 240 hour(s))  Microscopic Examination     Status: Abnormal   Collection Time: 06/11/21  3:07 PM   BLD  Result Value Ref Range Status   WBC, UA None seen 0 - 5 /hpf Final   RBC 0-2 0 - 2 /hpf Final   Epithelial Cells (  non renal) None seen 0 - 10 /hpf Final   Casts Present (A) None seen /lpf Final   Cast Type Hyaline casts N/A Final   Crystals Present (A) N/A Final   Crystal Type Uric Acid N/A Final   Mucus, UA Present (A) Not Estab. Final   Bacteria, UA None seen None seen/Few Final  Urine Culture     Status: None   Collection Time: 06/11/21  3:53 PM   Specimen: Urine   UR  Result Value Ref Range Status   Urine Culture, Routine Final report  Final   Organism ID, Bacteria No growth  Final  Blood culture (routine x 2)     Status: None   Collection Time: 06/12/21  1:27 AM   Specimen: BLOOD  Result Value Ref Range Status   Specimen Description BLOOD LEFT FA  Final   Special Requests   Final    BOTTLES DRAWN AEROBIC AND ANAEROBIC Blood Culture adequate volume   Culture   Final    NO GROWTH 5 DAYS Performed at [facility], [address], [place], [place] [address]    Report Status 06/17/2021 FINAL  Final  Blood culture (routine x 2)     Status: None    Collection Time: 06/12/21  1:27 AM   Specimen: BLOOD  Result Value Ref Range Status   Specimen Description BLOOD LEFT HAND  Final   Special Requests   Final    BOTTLES DRAWN AEROBIC AND ANAEROBIC Blood Culture adequate volume   Culture   Final    NO GROWTH 5 DAYS Performed at [facility], [address] [place], [place] [address]    Report Status 06/17/2021 FINAL  Final  Resp Panel by RT-PCR (Flu A&B, Covid) Nasopharyngeal Swab     Status: None   Collection Time: 06/12/21  1:29 AM   Specimen: Nasopharyngeal Swab; Nasopharyngeal(NP) swabs in vial transport medium  Result Value Ref Range Status   SARS Coronavirus 2 by RT PCR NEGATIVE NEGATIVE Final    Comment: (NOTE) SARS-CoV-2 target nucleic acids are NOT DETECTED.  The SARS-CoV-2 RNA is generally detectable in upper respiratory specimens during the acute phase of infection. The lowest concentration of SARS-CoV-2 viral copies this assay can detect is 138 copies/mL. A negative result does not preclude SARS-Cov-2 infection and should not be used as the sole basis for treatment or other patient management decisions. A negative result may occur with  improper specimen collection/handling, submission of specimen other than nasopharyngeal swab, presence of viral mutation(s) within the areas targeted by this assay, and inadequate number of viral copies(<138 copies/mL). A negative result must be combined with clinical observations, patient history, and epidemiological information. The expected result is Negative.  Fact Sheet for Patients:  EntrepreneurPulse.com.au  Fact Sheet for Healthcare Providers:  IncredibleEmployment.be  This test is no t yet approved or cleared by the [place] FDA and  has been authorized for detection and/or diagnosis of SARS-CoV-2 by FDA under an Emergency Use Authorization (EUA). This EUA will remain  in effect (meaning this test can be used) for the  duration of the COVID-19 declaration under Section 564(b)(1) of the Act, 21 U.S.C.section 360bbb-3(b)(1), unless the authorization is terminated  or revoked sooner.       Influenza A by PCR NEGATIVE NEGATIVE Final   Influenza B by PCR NEGATIVE NEGATIVE Final    Comment: (NOTE) The Xpert Xpress SARS-CoV-2/FLU/RSV plus assay is intended as an aid in the diagnosis of influenza from Nasopharyngeal swab specimens  and should not be used as a sole basis for treatment. Nasal washings and aspirates are unacceptable for Xpert Xpress SARS-CoV-2/FLU/RSV testing.  Fact Sheet for Patients: EntrepreneurPulse.com.au  Fact Sheet for Healthcare Providers: IncredibleEmployment.be  This test is not yet approved or cleared by the [place] FDA and has been authorized for detection and/or diagnosis of SARS-CoV-2 by FDA under an Emergency Use Authorization (EUA). This EUA will remain in effect (meaning this test can be used) for the duration of the COVID-19 declaration under Section 564(b)(1) of the Act, 21 U.S.C. section 360bbb-3(b)(1), unless the authorization is terminated or revoked.  Performed at [facility], [address], [place], [place] [address]   Urine Culture     Status: None   Collection Time: 06/12/21  3:36 AM   Specimen: Urine, Random  Result Value Ref Range Status   Specimen Description   Final    URINE, RANDOM Performed at [facility], [address], [place], [place] [address]    Special Requests   Final    NONE Performed at [facility], [address] [place], [place] [address]    Culture   Final    NO GROWTH Performed at [facility], [place] [address], [place],  [address]    Report Status 06/13/2021 FINAL  Final  Resp Panel by RT-PCR (Flu A&B, Covid) Nasopharyngeal Swab     Status: Abnormal   Collection Time: 06/17/21  2:09 PM   Specimen: Nasopharyngeal Swab; Nasopharyngeal(NP) swabs  in vial transport medium  Result Value Ref Range Status   SARS Coronavirus 2 by RT PCR POSITIVE (A) NEGATIVE Final    Comment: CRITICAL RESULT CALLED TO, READ BACK BY AND VERIFIED WITH: SUSAN HINO 06/17/21 1528 MU (NOTE) SARS-CoV-2 target nucleic acids are DETECTED.  The SARS-CoV-2 RNA is generally detectable in upper respiratory specimens during the acute phase of infection. Positive results are indicative of the presence of the identified virus, but do not rule out bacterial infection or co-infection with other pathogens not detected by the test. Clinical correlation with patient history and other diagnostic information is necessary to determine patient infection status. The expected result is Negative.  Fact Sheet for Patients: EntrepreneurPulse.com.au  Fact Sheet for Healthcare Providers: IncredibleEmployment.be  This test is not yet approved or cleared by the [place] FDA and  has been authorized for detection and/or diagnosis of SARS-CoV-2 by FDA under an Emergency Use Authorization (EUA).  This EUA will remain in effect (meaning this test can  be used) for the duration of  the COVID-19 declaration under Section 564(b)(1) of the Act, 21 U.S.C. section 360bbb-3(b)(1), unless the authorization is terminated or revoked sooner.     Influenza A by PCR NEGATIVE NEGATIVE Final   Influenza B by PCR NEGATIVE NEGATIVE Final    Comment: (NOTE) The Xpert Xpress SARS-CoV-2/FLU/RSV plus assay is intended as an aid in the diagnosis of influenza from Nasopharyngeal swab specimens and should not be used as a sole basis for treatment. Nasal washings and aspirates are unacceptable for Xpert Xpress SARS-CoV-2/FLU/RSV testing.  Fact Sheet for Patients: EntrepreneurPulse.com.au  Fact Sheet for Healthcare Providers: IncredibleEmployment.be  This test is not yet approved or cleared by the [place] FDA and has  been authorized for detection and/or diagnosis of SARS-CoV-2 by FDA under an Emergency Use Authorization (EUA). This EUA will remain in effect (meaning this test can be used) for the duration of the COVID-19 declaration under Section 564(b)(1) of the Act, 21 U.S.C. section  360bbb-3(b)(1), unless the authorization is terminated or revoked.  Performed at [facility], [address] [place], [place] [address]          Radiology Studies: No results found.      Scheduled Meds:  acidophilus  2 capsule Oral Daily   cholecalciferol  5,000 Units Oral Daily   ciprofloxacin  500 mg Oral BID   feeding supplement  237 mL Oral BID BM   gabapentin  400 mg Oral Daily   gabapentin  800 mg Oral QHS   heparin  5,000 Units Subcutaneous Q8H   lisinopril  15 mg Oral Daily   metroNIDAZOLE  500 mg Oral Q8H   multivitamin with minerals  1 tablet Oral Daily   pantoprazole  40 mg Oral Daily   Continuous Infusions:  remdesivir 100 mg in NS 100 mL Stopped (06/19/21 0946)      LOS: 8 days    Time spent: 35 minutes with more than 50% COC    Nolberto Hanlon, MD [facility]   If 7PM-7AM, please contact night-coverage  06/20/2021, 8:46 AM

## 2021-06-20 NOTE — Plan of Care (Signed)
Continuing with plan of care. 

## 2021-06-21 DIAGNOSIS — R6520 Severe sepsis without septic shock: Secondary | ICD-10-CM | POA: Diagnosis not present

## 2021-06-21 DIAGNOSIS — A419 Sepsis, unspecified organism: Secondary | ICD-10-CM | POA: Diagnosis not present

## 2021-06-21 MED ORDER — MORPHINE SULFATE (PF) 2 MG/ML IV SOLN: 1.0000 mg | INTRAVENOUS | Status: DC | PRN

## 2021-06-21 MED ORDER — LIDOCAINE 5 % EX PTCH
1.0000 | MEDICATED_PATCH | CUTANEOUS | Status: DC
  Administered 2021-06-21 – 2021-06-22 (×2): 1 via TRANSDERMAL
  Filled 2021-06-21 (×3): qty 1

## 2021-06-21 MED ORDER — ACETAMINOPHEN 325 MG PO TABS
650.0000 mg | ORAL_TABLET | ORAL | Status: DC | PRN
  Administered 2021-06-22 – 2021-06-23 (×3): 650 mg via ORAL
  Filled 2021-06-21 (×3): qty 2

## 2021-06-21 NOTE — Progress Notes (Signed)
PROGRESS NOTE    Daniel Leon  VUY:[id number] DOB: 1950/11/02 DOA: 06/12/2021 PCP: Valerie Roys, DO    Brief Narrative:  70 y.o. male with medical history significant of hypertension, diet-controlled diabetes, right bundle blockade, peripheral neuropathy, BPH, anemia, s/p of left BKA, CKD-3A, s/p of right second toe amputation due to to osteomyelitis, who presents with weakness and nausea.   Patient has history of right second toe osteomyelitis.  Patient is s/p of right second toe amputation on 06/07/2021.  Patient was initially treated with doxycycline.  He states that he has not been feeling well in the past several days.  Patient has severe generalized weakness, poor appetite, decreased oral intake.  He has nausea and dry heaves, no diarrhea or abdominal pain.  No fever or chills.  Patient has mild dry cough, no chest pain or shortness of breath.  Denies symptoms of UTI. He states that he does not have worsening pain in in the surgical site.  No active drainage noted.  Patient was seen by his podiatrist yesterday, who thought that his wound looks okay.  Patient was started on Bactrim.  On admission patient was seen in consultation by podiatry who did not feel that the toe was the source of sepsis.  No surgical intervention warranted.  CT imaging survey concerning for duodenitis.  Patient does report subjective improvement since initiation of antibiotics and IV fluids.  Still significantly weak and deconditioned.  Therapy is made recommendation for skilled nursing facility.  Patient is somewhat reluctant.  He is improving clinically.  10/28 awaiting 10 day quarantine for rehab 10/29 has no shortness of breath, chest pain, cough 10/30 no issues . His lower back sore from lying in bed.     Assessment & Plan:   Principal Problem:   Severe sepsis ([place]) Active Problems:   Essential hypertension   Normocytic anemia   UTI (urinary tract infection)   Cellulitis of second toe of right  foot   CKD (chronic kidney disease), stage IIIa   Duodenitis   Elevated troponin   Abnormal LFTs  Duodenitis Right lower extremity cellulitis Possible urinary tract infection Severe sepsis secondary to above Criteria met with leukocytosis, tachycardia, tachypnea, elevated lactic acid Initial hypotension resolved with IV fluid resuscitation Suspect that duodenitis is primary driver for infection No evidence of osteomyelitis No surgical intervention from podiatry standpoint 10/30 continue ciprofloxacin and Flagyl to complete 7-day course  Cultures to date negative      Covid + Currently asymptomatic Continue quarantining 10 days prior to going to [employer] which ends on 11/6 10 continue lisinopril/30 continue IV remdesivir to complete 5-day course Incentive spirometer      Essential hypertension Continue lisinopril   Normocytic anemia Suspect anemia of chronic kidney disease Stable, monitor periodically     Acute on CKD (chronic kidney disease), stage IIIa::  Renal function close to baseline.  Recent baseline creatinine 1.15-1.39, his creatinine is 1.36, BUN 26. 10/29 creatinine down to 1.28   Abnormal liver function Patient has some mild abnormal liver function, likely due to sepsis. -Monitor   Elevated troponin:  Troponin level 60, 69, no chest pain, likely due to demand ischemia. -Echocardiogram reassuring.  Normal EF.  No valvulopathy.  Found with mild dilatation of aortic root and ascending aorta on echo Need to f/u with cards as outpt       DVT prophylaxis: SQ heparin Code Status: Full Family Communication: Wife at bedside Disposition Plan: Status is: Inpatient  Remains inpatient appropriate because: Severe  sepsis secondary to likely duodenitis.  Sepsis physiology resolved.  Needs 10 days of quarantining since he is COVID-positive prior to SNF      Level of care: Med-Surg  Consultants:  Podiatry  Procedures:   None  Antimicrobials: Ciprofloxacin Metronidazole   Subjective: No shortness of breath, chest pain, diarrhea or cough      Objective: Vitals:   06/20/21 0835 06/20/21 1727 06/20/21 2050 06/21/21 0441  BP: 139/78 128/72 128/76 126/72  Pulse: 80 85 94 89  Resp: '14 12 16 15  ' Temp: 97.9 F (36.6 C) 98.1 F (36.7 C) 98.1 F (36.7 C) 98.8 F (37.1 C)  TempSrc: Oral Oral Oral Oral  SpO2: 91% 97% 96% 94%  Weight:      Height:        Intake/Output Summary (Last 24 hours) at 06/21/2021 0814 Last data filed at 06/21/2021 0259 Gross per 24 hour  Intake 100 ml  Output 1701 ml  Net -1601 ml   Filed Weights   06/12/21 0121  Weight: 112.9 kg    Examination: Calm, NAD CTA no wheeze Regular S1-S2 no gallops  soft benign positive bowel sounds Awake oriented x3 Mood and affect appropriate in current setting     Data Reviewed: I have personally reviewed following labs and imaging studies  CBC: Recent Labs  Lab 06/15/21 0427 06/16/21 0502 06/17/21 0450 06/18/21 0646 06/19/21 0718  WBC 10.2 10.4 10.7* 11.4* 9.8  NEUTROABS 6.8 5.8 6.3 7.0 6.1  HGB 10.1* 10.9* 11.1* 10.8* 11.3*  HCT 29.8* 31.5* 33.1* 32.7* 33.6*  MCV 88.2 87.7 86.9 86.5 87.0  PLT 358 402* 403* 414* 376*   Basic Metabolic Panel: Recent Labs  Lab 06/15/21 0427 06/16/21 0502 06/17/21 0450 06/18/21 0646 06/19/21 0718 06/20/21 0459  NA 133* 134* 134* 132* 132*  --   K 3.9 3.9 4.0 4.3 4.5  --   CL 103 99 99 97* 98  --   CO2 '24 24 27 27 26  ' --   GLUCOSE 97 113* 125* 139* 154*  --   BUN '22 23 22 ' 24* 25*  --   CREATININE 1.21 1.11 1.15 1.26* 1.35* 1.28*  CALCIUM 8.5* 9.0 8.9 8.9 8.8*  --    GFR: Estimated Creatinine Clearance: 72.8 mL/min (A) (by C-G formula based on SCr of 1.28 mg/dL (H)). Liver Function Tests: No results for input(s): AST, ALT, ALKPHOS, BILITOT, PROT, ALBUMIN in the last 168 hours.  No results for input(s): LIPASE, AMYLASE in the last 168 hours. No results for  input(s): AMMONIA in the last 168 hours. Coagulation Profile: No results for input(s): INR, PROTIME in the last 168 hours.  Cardiac Enzymes: No results for input(s): CKTOTAL, CKMB, CKMBINDEX, TROPONINI in the last 168 hours. BNP (last 3 results) No results for input(s): PROBNP in the last 8760 hours. HbA1C: No results for input(s): HGBA1C in the last 72 hours. CBG: No results for input(s): GLUCAP in the last 168 hours.  Lipid Profile: No results for input(s): CHOL, HDL, LDLCALC, TRIG, CHOLHDL, LDLDIRECT in the last 72 hours.  Thyroid Function Tests: No results for input(s): TSH, T4TOTAL, FREET4, T3FREE, THYROIDAB in the last 72 hours. Anemia Panel: No results for input(s): VITAMINB12, FOLATE, FERRITIN, TIBC, IRON, RETICCTPCT in the last 72 hours. Sepsis Labs: Recent Labs  Lab 06/15/21 0427  PROCALCITON 0.48    Recent Results (from the past 240 hour(s))  Microscopic Examination     Status: Abnormal   Collection Time: 06/11/21  3:07 PM   BLD  Result  Value Ref Range Status   WBC, UA None seen 0 - 5 /hpf Final   RBC 0-2 0 - 2 /hpf Final   Epithelial Cells (non renal) None seen 0 - 10 /hpf Final   Casts Present (A) None seen /lpf Final   Cast Type Hyaline casts N/A Final   Crystals Present (A) N/A Final   Crystal Type Uric Acid N/A Final   Mucus, UA Present (A) Not Estab. Final   Bacteria, UA None seen None seen/Few Final  Urine Culture     Status: None   Collection Time: 06/11/21  3:53 PM   Specimen: Urine   UR  Result Value Ref Range Status   Urine Culture, Routine Final report  Final   Organism ID, Bacteria No growth  Final  Blood culture (routine x 2)     Status: None   Collection Time: 06/12/21  1:27 AM   Specimen: BLOOD  Result Value Ref Range Status   Specimen Description BLOOD LEFT FA  Final   Special Requests   Final    BOTTLES DRAWN AEROBIC AND ANAEROBIC Blood Culture adequate volume   Culture   Final    NO GROWTH 5 DAYS Performed at [facility], [address] [place], [place] [address]    Report Status 06/17/2021 FINAL  Final  Blood culture (routine x 2)     Status: None   Collection Time: 06/12/21  1:27 AM   Specimen: BLOOD  Result Value Ref Range Status   Specimen Description BLOOD LEFT HAND  Final   Special Requests   Final    BOTTLES DRAWN AEROBIC AND ANAEROBIC Blood Culture adequate volume   Culture   Final    NO GROWTH 5 DAYS Performed at [facility], [address] [place], [place] [address]    Report Status 06/17/2021 FINAL  Final  Resp Panel by RT-PCR (Flu A&B, Covid) Nasopharyngeal Swab     Status: None   Collection Time: 06/12/21  1:29 AM   Specimen: Nasopharyngeal Swab; Nasopharyngeal(NP) swabs in vial transport medium  Result Value Ref Range Status   SARS Coronavirus 2 by RT PCR NEGATIVE NEGATIVE Final    Comment: (NOTE) SARS-CoV-2 target nucleic acids are NOT DETECTED.  The SARS-CoV-2 RNA is generally detectable in upper respiratory specimens during the acute phase of infection. The lowest concentration of SARS-CoV-2 viral copies this assay can detect is 138 copies/mL. A negative result does not preclude SARS-Cov-2 infection and should not be used as the sole basis for treatment or other patient management decisions. A negative result may occur with  improper specimen collection/handling, submission of specimen other than nasopharyngeal swab, presence of viral mutation(s) within the areas targeted by this assay, and inadequate number of viral copies(<138 copies/mL). A negative result must be combined with clinical observations, patient history, and epidemiological information. The expected result is Negative.  Fact Sheet for Patients:  EntrepreneurPulse.com.au  Fact Sheet for Healthcare Providers:  IncredibleEmployment.be  This test is no t yet approved or cleared by the [place] FDA and  has been authorized for detection and/or diagnosis of  SARS-CoV-2 by FDA under an Emergency Use Authorization (EUA). This EUA will remain  in effect (meaning this test can be used) for the duration of the COVID-19 declaration under Section 564(b)(1) of the Act, 21 U.S.C.section 360bbb-3(b)(1), unless the authorization is terminated  or revoked sooner.       Influenza A by PCR NEGATIVE NEGATIVE Final   Influenza B by PCR  NEGATIVE NEGATIVE Final    Comment: (NOTE) The Xpert Xpress SARS-CoV-2/FLU/RSV plus assay is intended as an aid in the diagnosis of influenza from Nasopharyngeal swab specimens and should not be used as a sole basis for treatment. Nasal washings and aspirates are unacceptable for Xpert Xpress SARS-CoV-2/FLU/RSV testing.  Fact Sheet for Patients: EntrepreneurPulse.com.au  Fact Sheet for Healthcare Providers: IncredibleEmployment.be  This test is not yet approved or cleared by the [place] FDA and has been authorized for detection and/or diagnosis of SARS-CoV-2 by FDA under an Emergency Use Authorization (EUA). This EUA will remain in effect (meaning this test can be used) for the duration of the COVID-19 declaration under Section 564(b)(1) of the Act, 21 U.S.C. section 360bbb-3(b)(1), unless the authorization is terminated or revoked.  Performed at [facility], [address] [place], [place] [address]   Urine Culture     Status: None   Collection Time: 06/12/21  3:36 AM   Specimen: Urine, Random  Result Value Ref Range Status   Specimen Description   Final    URINE, RANDOM Performed at [facility], [address], [place], [place] [address]    Special Requests   Final    NONE Performed at [facility], [address], [place], [place] [address]    Culture   Final    NO GROWTH Performed at [facility], [place] [address] [place], [place] [address]    Report Status 06/13/2021 FINAL  Final  Resp Panel by RT-PCR (Flu A&B, Covid)  Nasopharyngeal Swab     Status: Abnormal   Collection Time: 06/17/21  2:09 PM   Specimen: Nasopharyngeal Swab; Nasopharyngeal(NP) swabs in vial transport medium  Result Value Ref Range Status   SARS Coronavirus 2 by RT PCR POSITIVE (A) NEGATIVE Final    Comment: CRITICAL RESULT CALLED TO, READ BACK BY AND VERIFIED WITH: SUSAN HINO 06/17/21 1528 MU (NOTE) SARS-CoV-2 target nucleic acids are DETECTED.  The SARS-CoV-2 RNA is generally detectable in upper respiratory specimens during the acute phase of infection. Positive results are indicative of the presence of the identified virus, but do not rule out bacterial infection or co-infection with other pathogens not detected by the test. Clinical correlation with patient history and other diagnostic information is necessary to determine patient infection status. The expected result is Negative.  Fact Sheet for Patients: EntrepreneurPulse.com.au  Fact Sheet for Healthcare Providers: IncredibleEmployment.be  This test is not yet approved or cleared by the [place] FDA and  has been authorized for detection and/or diagnosis of SARS-CoV-2 by FDA under an Emergency Use Authorization (EUA).  This EUA will remain in effect (meaning this test can  be used) for the duration of  the COVID-19 declaration under Section 564(b)(1) of the Act, 21 U.S.C. section 360bbb-3(b)(1), unless the authorization is terminated or revoked sooner.     Influenza A by PCR NEGATIVE NEGATIVE Final   Influenza B by PCR NEGATIVE NEGATIVE Final    Comment: (NOTE) The Xpert Xpress SARS-CoV-2/FLU/RSV plus assay is intended as an aid in the diagnosis of influenza from Nasopharyngeal swab specimens and should not be used as a sole basis for treatment. Nasal washings and aspirates are unacceptable for Xpert Xpress SARS-CoV-2/FLU/RSV testing.  Fact Sheet for Patients: EntrepreneurPulse.com.au  Fact Sheet for  Healthcare Providers: IncredibleEmployment.be  This test is not yet approved or cleared by the [place] FDA and has been authorized for detection and/or diagnosis of SARS-CoV-2 by FDA under an Emergency Use Authorization (EUA).  This EUA will remain in effect (meaning this test can be used) for the duration of the COVID-19 declaration under Section 564(b)(1) of the Act, 21 U.S.C. section 360bbb-3(b)(1), unless the authorization is terminated or revoked.  Performed at [facility], [address], [place], [place] [address]          Radiology Studies: No results found.      Scheduled Meds:  acidophilus  2 capsule Oral Daily   cholecalciferol  5,000 Units Oral Daily   ciprofloxacin  500 mg Oral BID   feeding supplement  237 mL Oral BID BM   gabapentin  400 mg Oral Daily   gabapentin  800 mg Oral QHS   heparin  5,000 Units Subcutaneous Q8H   lisinopril  15 mg Oral Daily   metroNIDAZOLE  500 mg Oral Q8H   multivitamin with minerals  1 tablet Oral Daily   pantoprazole  40 mg Oral Daily   Continuous Infusions:  remdesivir 100 mg in NS 100 mL Stopped (06/20/21 1119)      LOS: 9 days    Time spent: 35 minutes with more than 50% COC    Nolberto Hanlon, MD [facility]   If 7PM-7AM, please contact night-coverage  06/21/2021, 8:14 AM

## 2021-06-21 NOTE — Plan of Care (Signed)
Continuing with plan of care. 

## 2021-06-22 ENCOUNTER — Telehealth: Payer: Self-pay | Admitting: Podiatry

## 2021-06-22 DIAGNOSIS — A419 Sepsis, unspecified organism: Secondary | ICD-10-CM | POA: Diagnosis not present

## 2021-06-22 DIAGNOSIS — R6520 Severe sepsis without septic shock: Secondary | ICD-10-CM | POA: Diagnosis not present

## 2021-06-22 LAB — CREATININE, SERUM
Creatinine, Ser: 1.33 mg/dL — ABNORMAL HIGH (ref 0.61–1.24)
GFR, Estimated: 57 mL/min — ABNORMAL LOW (ref >60)

## 2021-06-22 NOTE — Telephone Encounter (Signed)
Patients wife called stating she would like to speak to you in reference to her husband. Patient states her husband is in the hospital and she has some questions. Margarets phone number is [phone].

## 2021-06-22 NOTE — Progress Notes (Signed)
PROGRESS NOTE    Daniel Leon  AVW:[id number] DOB: 08-25-1950 DOA: 06/12/2021 PCP: Valerie Roys, DO    Brief Narrative:  70 y.o. male with medical history significant of hypertension, diet-controlled diabetes, right bundle blockade, peripheral neuropathy, BPH, anemia, s/p of left BKA, CKD-3A, s/p of right second toe amputation due to to osteomyelitis, who presents with weakness and nausea.   Patient has history of right second toe osteomyelitis.  Patient is s/p of right second toe amputation on 06/07/2021.  Patient was initially treated with doxycycline.  He states that he has not been feeling well in the past several days.  Patient has severe generalized weakness, poor appetite, decreased oral intake.  He has nausea and dry heaves, no diarrhea or abdominal pain.  No fever or chills.  Patient has mild dry cough, no chest pain or shortness of breath.  Denies symptoms of UTI. He states that he does not have worsening pain in in the surgical site.  No active drainage noted.  Patient was seen by his podiatrist yesterday, who thought that his wound looks okay.  Patient was started on Bactrim.  On admission patient was seen in consultation by podiatry who did not feel that the toe was the source of sepsis.  No surgical intervention warranted.  CT imaging survey concerning for duodenitis.  Patient does report subjective improvement since initiation of antibiotics and IV fluids.  Still significantly weak and deconditioned.  Therapy is made recommendation for skilled nursing facility.  Patient is somewhat reluctant.  He is improving clinically.  10/28 awaiting 10 day quarantine for rehab 10/29 has no shortness of breath, chest pain, cough 10/30 no issues . His lower back sore from lying in bed.  10/31 no issues, no respiratory sx    Assessment & Plan:   Principal Problem:   Severe sepsis ([place]) Active Problems:   Essential hypertension   Normocytic anemia   UTI (urinary tract  infection)   Cellulitis of second toe of right foot   CKD (chronic kidney disease), stage IIIa   Duodenitis   Elevated troponin   Abnormal LFTs  Duodenitis Right lower extremity cellulitis Possible urinary tract infection Severe sepsis secondary to above Criteria met with leukocytosis, tachycardia, tachypnea, elevated lactic acid Initial hypotension resolved with IV fluid resuscitation Suspect that duodenitis is primary driver for infection No evidence of osteomyelitis No surgical intervention from podiatry standpoint 10/31 completed abx course with cipro and flagyl Cx tdn     Covid + Currently asymptomatic Continue quarantining 10 days prior to going to [employer] which ends on 11/6 10/31 continue IV remdesivir to complete 5-day course  Incentive spirometer     Essential hypertension Continue lisinopril   Normocytic anemia Suspect anemia of chronic kidney disease Stable, monitor periodically     Acute on CKD (chronic kidney disease), stage IIIa::  Renal function close to baseline.  Recent baseline creatinine 1.15-1.39, his creatinine is 1.36, BUN 26. 10/29 creatinine down to 1.28   Abnormal liver function Patient has some mild abnormal liver function, likely due to sepsis. -Monitor   Elevated troponin:  Troponin level 60, 69, no chest pain, likely due to demand ischemia. -Echocardiogram reassuring.  Normal EF.  No valvulopathy.  Found with mild dilatation of aortic root and ascending aorta on echo Need to f/u with cards as outpt       DVT prophylaxis: SQ heparin Code Status: Full Family Communication: Wife at bedside Disposition Plan: Status is: Inpatient  Remains inpatient appropriate because: Severe sepsis  secondary to likely duodenitis.  Sepsis physiology resolved.  Needs 10 days of quarantining since he is COVID-positive prior to SNF      Level of care: Med-Surg  Consultants:  Podiatry  Procedures:   None  Antimicrobials: Ciprofloxacin Metronidazole   Subjective: No shortness of breath, dizziness, or cough      Objective: Vitals:   06/21/21 1736 06/21/21 2037 06/22/21 0437 06/22/21 0816  BP: 121/63 (!) 126/57 116/66 117/69  Pulse: 96 92 83 87  Resp: '14 16 20 18  ' Temp: 97.6 F (36.4 C) 98.9 F (37.2 C) 98.6 F (37 C) 98.6 F (37 C)  TempSrc: Oral Oral Oral   SpO2: 97% 95% 94% 93%  Weight:      Height:        Intake/Output Summary (Last 24 hours) at 06/22/2021 0848 Last data filed at 06/22/2021 0814 Gross per 24 hour  Intake 100 ml  Output 1500 ml  Net -1400 ml   Filed Weights   06/12/21 0121  Weight: 112.9 kg    Examination: Calm, NAD CTA no wheeze Regular S1-S2 no gallops Soft benign positive bowel sounds Dressing in place Awake alert and oriented x3     Data Reviewed: I have personally reviewed following labs and imaging studies  CBC: Recent Labs  Lab 06/16/21 0502 06/17/21 0450 06/18/21 0646 06/19/21 0718  WBC 10.4 10.7* 11.4* 9.8  NEUTROABS 5.8 6.3 7.0 6.1  HGB 10.9* 11.1* 10.8* 11.3*  HCT 31.5* 33.1* 32.7* 33.6*  MCV 87.7 86.9 86.5 87.0  PLT 402* 403* 414* 453*   Basic Metabolic Panel: Recent Labs  Lab 06/16/21 0502 06/17/21 0450 06/18/21 0646 06/19/21 0718 06/20/21 0459  NA 134* 134* 132* 132*  --   K 3.9 4.0 4.3 4.5  --   CL 99 99 97* 98  --   CO2 '24 27 27 26  ' --   GLUCOSE 113* 125* 139* 154*  --   BUN 23 22 24* 25*  --   CREATININE 1.11 1.15 1.26* 1.35* 1.28*  CALCIUM 9.0 8.9 8.9 8.8*  --    GFR: Estimated Creatinine Clearance: 72.8 mL/min (A) (by C-G formula based on SCr of 1.28 mg/dL (H)). Liver Function Tests: No results for input(s): AST, ALT, ALKPHOS, BILITOT, PROT, ALBUMIN in the last 168 hours.  No results for input(s): LIPASE, AMYLASE in the last 168 hours. No results for input(s): AMMONIA in the last 168 hours. Coagulation Profile: No results for input(s): INR, PROTIME in the last 168  hours.  Cardiac Enzymes: No results for input(s): CKTOTAL, CKMB, CKMBINDEX, TROPONINI in the last 168 hours. BNP (last 3 results) No results for input(s): PROBNP in the last 8760 hours. HbA1C: No results for input(s): HGBA1C in the last 72 hours. CBG: No results for input(s): GLUCAP in the last 168 hours.  Lipid Profile: No results for input(s): CHOL, HDL, LDLCALC, TRIG, CHOLHDL, LDLDIRECT in the last 72 hours.  Thyroid Function Tests: No results for input(s): TSH, T4TOTAL, FREET4, T3FREE, THYROIDAB in the last 72 hours. Anemia Panel: No results for input(s): VITAMINB12, FOLATE, FERRITIN, TIBC, IRON, RETICCTPCT in the last 72 hours. Sepsis Labs: No results for input(s): PROCALCITON, LATICACIDVEN in the last 168 hours.   Recent Results (from the past 240 hour(s))  Resp Panel by RT-PCR (Flu A&B, Covid) Nasopharyngeal Swab     Status: Abnormal   Collection Time: 06/17/21  2:09 PM   Specimen: Nasopharyngeal Swab; Nasopharyngeal(NP) swabs in vial transport medium  Result Value Ref Range Status   SARS  Coronavirus 2 by RT PCR POSITIVE (A) NEGATIVE Final    Comment: CRITICAL RESULT CALLED TO, READ BACK BY AND VERIFIED WITH: SUSAN HINO 06/17/21 1528 MU (NOTE) SARS-CoV-2 target nucleic acids are DETECTED.  The SARS-CoV-2 RNA is generally detectable in upper respiratory specimens during the acute phase of infection. Positive results are indicative of the presence of the identified virus, but do not rule out bacterial infection or co-infection with other pathogens not detected by the test. Clinical correlation with patient history and other diagnostic information is necessary to determine patient infection status. The expected result is Negative.  Fact Sheet for Patients: EntrepreneurPulse.com.au  Fact Sheet for Healthcare Providers: IncredibleEmployment.be  This test is not yet approved or cleared by the [place] FDA and  has been  authorized for detection and/or diagnosis of SARS-CoV-2 by FDA under an Emergency Use Authorization (EUA).  This EUA will remain in effect (meaning this test can  be used) for the duration of  the COVID-19 declaration under Section 564(b)(1) of the Act, 21 U.S.C. section 360bbb-3(b)(1), unless the authorization is terminated or revoked sooner.     Influenza A by PCR NEGATIVE NEGATIVE Final   Influenza B by PCR NEGATIVE NEGATIVE Final    Comment: (NOTE) The Xpert Xpress SARS-CoV-2/FLU/RSV plus assay is intended as an aid in the diagnosis of influenza from Nasopharyngeal swab specimens and should not be used as a sole basis for treatment. Nasal washings and aspirates are unacceptable for Xpert Xpress SARS-CoV-2/FLU/RSV testing.  Fact Sheet for Patients: EntrepreneurPulse.com.au  Fact Sheet for Healthcare Providers: IncredibleEmployment.be  This test is not yet approved or cleared by the [place] FDA and has been authorized for detection and/or diagnosis of SARS-CoV-2 by FDA under an Emergency Use Authorization (EUA). This EUA will remain in effect (meaning this test can be used) for the duration of the COVID-19 declaration under Section 564(b)(1) of the Act, 21 U.S.C. section 360bbb-3(b)(1), unless the authorization is terminated or revoked.  Performed at [facility], [address], [place], [place] [address]          Radiology Studies: No results found.      Scheduled Meds:  acidophilus  2 capsule Oral Daily   cholecalciferol  5,000 Units Oral Daily   ciprofloxacin  500 mg Oral BID   feeding supplement  237 mL Oral BID BM   gabapentin  400 mg Oral Daily   gabapentin  800 mg Oral QHS   heparin  5,000 Units Subcutaneous Q8H   lidocaine  1 patch Transdermal Q24H   lisinopril  15 mg Oral Daily   metroNIDAZOLE  500 mg Oral Q8H   multivitamin with minerals  1 tablet Oral Daily   pantoprazole  40 mg Oral Daily    Continuous Infusions:  remdesivir 100 mg in NS 100 mL Stopped (06/21/21 1332)      LOS: 10 days    Time spent:20 minutes with more than 50% COC    Nolberto Hanlon, MD [facility]   If 7PM-7AM, please contact night-coverage  06/22/2021, 8:48 AM

## 2021-06-22 NOTE — Progress Notes (Signed)
Physical Therapy Treatment Patient Details Name: Daniel Leon MRN: [id number] DOB: August 04, 1951 Today's Date: 06/22/2021   History of Present Illness 70 y.o. male with medical history significant of hypertension, diet-controlled diabetes, right bundle blockade, peripheral neuropathy, BPH, anemia, s/p of left BKA, CKD, s/p of right second toe amputation (10/16), who presented to [facility] with weakness and nausea. Pt admitted for sepsis secondary to duodenitis    PT Comments    Pt continues to make excellent functional progress. Focused session on increasing endurance/gait distance. Pt tolerated 140ft with support of RW, CGA, and step through pattern. No LOB noted or increased fatigue. Pt remains in-pt due to recent dx of Covid until ~11/6 however pt is progressing well during this time and may be able to safely d/c to home with HHPT. Will continue to assess needs withing POC.   Recommendations for follow up therapy are one component of a multi-disciplinary discharge planning process, led by the attending physician.  Recommendations may be updated based on patient status, additional functional criteria and insurance authorization.  Follow Up Recommendations  Skilled nursing-short term rehab (<3 hours/day)     Assistance Recommended at Discharge None  Equipment Recommendations  None recommended by PT    Recommendations for Other Services       Precautions / Restrictions Precautions Precautions: Fall Restrictions Weight Bearing Restrictions: Yes RLE Weight Bearing: Weight bearing as tolerated     Mobility  Bed Mobility                    Transfers Overall transfer level: Needs assistance Equipment used: Rolling walker (2 wheels) Transfers: Sit to/from Stand Sit to Stand: Min assist (from recliner with raised seat cushion)           General transfer comment: Pt continues to improve functionally    Ambulation/Gait Ambulation/Gait assistance: Min guard Gait  Distance (Feet): 150 Feet Assistive device: Rolling walker (2 wheels) Gait Pattern/deviations: Step-through pattern     General Gait Details:  (No c/o fatigue with increassed distance gait, good balance noted)   Stairs             Wheelchair Mobility    Modified Rankin (Stroke Patients Only)       Balance Overall balance assessment: Needs assistance Sitting-balance support: No upper extremity supported;Feet supported Sitting balance-Leahy Scale: Good     Standing balance support: No upper extremity supported   Standing balance comment: Dynamic standing activity requiring UE support from RW                            Cognition Arousal/Alertness: Awake/alert Behavior During Therapy: WFL for tasks assessed/performed Overall Cognitive Status: Within Functional Limits for tasks assessed                                          Exercises      General Comments General comments (skin integrity, edema, etc.):  (Pt continues to improve, reviewed LE HEP with good understanding)      Pertinent Vitals/Pain Pain Assessment: No/denies pain    Home Living                          Prior Function            PT Goals (current goals can now be found in the  care plan section) Acute Rehab PT Goals Patient Stated Goal: to feel better and then go home Progress towards PT goals: Progressing toward goals    Frequency    Min 2X/week      PT Plan Current plan remains appropriate    Co-evaluation              AM-PAC PT "6 Clicks" Mobility   Outcome Measure  Help needed turning from your back to your side while in a flat bed without using bedrails?: A Little Help needed moving from lying on your back to sitting on the side of a flat bed without using bedrails?: A Little Help needed moving to and from a bed to a chair (including a wheelchair)?: A Little Help needed standing up from a chair using your arms (e.g., wheelchair  or bedside chair)?: A Little Help needed to walk in hospital room?: A Little Help needed climbing 3-5 steps with a railing? : A Lot 6 Click Score: 17    End of Session Equipment Utilized During Treatment: Gait belt Activity Tolerance: Patient tolerated treatment well Patient left: in chair;with call bell/phone within reach Nurse Communication: Mobility status PT Visit Diagnosis: Unsteadiness on feet (R26.81);Muscle weakness (generalized) (M62.81);Difficulty in walking, not elsewhere classified (R26.2)     Time: 3546-5681 PT Time Calculation (min) (ACUTE ONLY): 24 min  Charges:  $Gait Training: 8-22 mins $Therapeutic Activity: 8-22 mins                    Zadie Cleverly, PTA    Jannet Askew 06/22/2021, 11:20 AM

## 2021-06-22 NOTE — TOC Progression Note (Signed)
Transition of Care [facility] - Progression Note    Patient Details  Name: Daniel Leon MRN: [id number] Date of Birth: 1951/05/25  [facility] of [facility]) CM/SW Contact  Maree Krabbe, LCSW Phone Number: 06/22/2021, 10:46 AM  Clinical Narrative:   Plan is to dc to [facility] 11/6    Expected Discharge Plan: Skilled Nursing Facility Barriers to Discharge: Continued Medical Work up  Expected Discharge Plan and Services Expected Discharge Plan: Skilled Nursing Facility     Post Acute Care Choice:  (undecided at this time) Living arrangements for the past 2 months: Single Family Home                                       Social Determinants of Health (SDOH) Interventions    Readmission Risk Interventions No flowsheet data found.

## 2021-06-23 MED ORDER — PANTOPRAZOLE SODIUM 40 MG PO TBEC: 40.0000 mg | DELAYED_RELEASE_TABLET | Freq: Every day | ORAL | 0 refills | Status: DC

## 2021-06-23 MED ORDER — ACETAMINOPHEN 325 MG PO TABS: 650.0000 mg | ORAL_TABLET | Freq: Four times a day (QID) | ORAL | 0 refills | Status: AC | PRN

## 2021-06-23 MED ORDER — GABAPENTIN 400 MG PO CAPS: 400.0000 mg | ORAL_CAPSULE | Freq: Every day | ORAL | 0 refills | Status: DC

## 2021-06-23 MED ORDER — LIDOCAINE 5 % EX PTCH: 1.0000 | MEDICATED_PATCH | CUTANEOUS | 0 refills | Status: DC

## 2021-06-23 NOTE — Progress Notes (Signed)
Physical Therapy Treatment Patient Details Name: Daniel Leon MRN: [id number] DOB: 15-Aug-1951 Today's Date: 06/23/2021   History of Present Illness 70 y.o. male with medical history significant of hypertension, diet-controlled diabetes, right bundle blockade, peripheral neuropathy, BPH, anemia, s/p of left BKA, CKD, s/p of right second toe amputation (10/16), who presented to [facility] with weakness and nausea. Pt admitted for sepsis secondary to duodenitis    PT Comments    Pt has made great improvements in his functional mobility with usage of RW. Pt able to complete transfer from lower surface with SPV with B UE support and ambulated in room with RW and SPV + R post op shoe. Pt demonstrating improved dynamic balance with usage of assistive devices. Updating recommendations to home with HHPT and SPV from spouse at discharge. Pt will benefit from further skilled PT services to improve balance and progress mobility without assistive devices as able.    Recommendations for follow up therapy are one component of a multi-disciplinary discharge planning process, led by the attending physician.  Recommendations may be updated based on patient status, additional functional criteria and insurance authorization.  Follow Up Recommendations  Home health PT     Assistance Recommended at Discharge Intermittent Supervision/Assistance  Equipment Recommendations  None recommended by PT    Recommendations for Other Services       Precautions / Restrictions Precautions Precautions: Fall Restrictions Weight Bearing Restrictions: Yes RLE Weight Bearing: Weight bearing as tolerated Other Position/Activity Restrictions: in post op shoe     Mobility  Bed Mobility Overal bed mobility: Needs Assistance             General bed mobility comments: Received in chair    Transfers Overall transfer level: Needs assistance Equipment used: Rolling walker (2 wheels) Transfers: Sit to/from Stand Sit  to Stand: Supervision           General transfer comment: SPV for safety but no difficulty with standing from lower surface using B UE assistance    Ambulation/Gait Ambulation/Gait assistance: Supervision Gait Distance (Feet): 75 Feet Assistive device: Rolling walker (2 wheels) Gait Pattern/deviations: Step-through pattern;Decreased stride length Gait velocity: decreased   General Gait Details: Pt able to ambulate in room with R post op shoe and no loss of balance noted throughout gait training   Stairs             Wheelchair Mobility    Modified Rankin (Stroke Patients Only)       Balance Overall balance assessment: Needs assistance Sitting-balance support: No upper extremity supported;Feet supported Sitting balance-Leahy Scale: Good     Standing balance support: Bilateral upper extremity supported;During functional activity;Reliant on assistive device for balance Standing balance-Leahy Scale: Good Standing balance comment: Light UE assistance on RW throughout but needed to maintain balance during dynamic activities.                            Cognition Arousal/Alertness: Awake/alert Behavior During Therapy: WFL for tasks assessed/performed Overall Cognitive Status: Within Functional Limits for tasks assessed                                          Exercises General Exercises - Upper Extremity Shoulder Flexion: AROM;Strengthening;Both;10 reps;Seated Chair Push Up: Strengthening;Both;10 reps;Seated General Exercises - Lower Extremity Long Arc Quad: AROM;Strengthening;Both;20 reps;Seated Hip Flexion/Marching: Strengthening;Both;20 reps;Seated    General  Comments General comments (skin integrity, edema, etc.): Pt denies any dizziness throughout ambulation and position changes      Pertinent Vitals/Pain Pain Assessment: No/denies pain    Home Living                          Prior Function            PT  Goals (current goals can now be found in the care plan section) Acute Rehab PT Goals PT Goal Formulation: With patient Time For Goal Achievement: 06/29/21 Potential to Achieve Goals: Good Progress towards PT goals: Progressing toward goals    Frequency    Min 2X/week      PT Plan Discharge plan needs to be updated    Co-evaluation              AM-PAC PT "6 Clicks" Mobility   Outcome Measure  Help needed turning from your back to your side while in a flat bed without using bedrails?: A Little Help needed moving from lying on your back to sitting on the side of a flat bed without using bedrails?: A Little Help needed moving to and from a bed to a chair (including a wheelchair)?: A Little Help needed standing up from a chair using your arms (e.g., wheelchair or bedside chair)?: None Help needed to walk in hospital room?: A Little Help needed climbing 3-5 steps with a railing? : A Little 6 Click Score: 19    End of Session Equipment Utilized During Treatment: Other (comment) (R post op shoe) Activity Tolerance: Patient tolerated treatment well Patient left: in chair;with call bell/phone within reach;with chair alarm set;with family/visitor present Nurse Communication: Mobility status PT Visit Diagnosis: Unsteadiness on feet (R26.81);Muscle weakness (generalized) (M62.81);Difficulty in walking, not elsewhere classified (R26.2)     Time: 1400-1411 PT Time Calculation (min) (ACUTE ONLY): 11 min  Charges:  $Gait Training: 8-22 mins                     Verl Blalock, SPT   Verl Blalock 06/23/2021, 4:31 PM

## 2021-06-23 NOTE — TOC Progression Note (Addendum)
Transition of Care [facility]) - Progression Note    Patient Details  Name: Daniel Leon MRN: [id number] Date of Birth: 1951/02/18  [facility] CM/SW Contact  Maree Krabbe, LCSW Phone Number: 06/23/2021, 11:52 AM  Clinical Narrative:  Per MD pt is refusing SNF. Pt will dc home today, no DME needs. CSW spoke with pt and pt is agreeable to [facility] with no agency preference. [facility] [place] service.    Expected Discharge Plan: Skilled Nursing Facility Barriers to Discharge: Continued Medical Work up  Expected Discharge Plan and Services Expected Discharge Plan: Skilled Nursing Facility     Post Acute Care Choice:  (undecided at this time) Living arrangements for the past 2 months: Single Family Home                                       Social Determinants of Health (SDOH) Interventions    Readmission Risk Interventions No flowsheet data found.

## 2021-06-23 NOTE — Care Management Important Message (Signed)
Important Message  Patient Details  Name: Daniel Leon MRN: [id number] Date of Birth: 02-23-1951   Medicare Important Message Given:  Yes  Reviewed Medicare IM with patient via room phone due to isolation status.  Copy of Medicare IM mailed to patient's attention at address on file.     [name] 06/23/2021, 2:44 PM

## 2021-06-23 NOTE — Discharge Summary (Signed)
Daniel Leon:[id number] DOB: [birth year] DOA: 06/12/2021  PCP: Valerie Roys, DO  Admit date: 06/12/2021 Discharge date: 06/23/2021  Admitted From: home Disposition:  home. Refused SNF  Recommendations for Outpatient Follow-up:  Follow up with PCP in 1 week Please obtain BMP/CBC in one week Please follow up podiatry in one week  Home Health:yes   Discharge Condition:Stable CODE STATUS:full  Diet recommendation: Heart Healthy Brief/Interim Summary: Per HPI: Daniel Leon is a 70 y.o. male with medical history significant of hypertension, diet-controlled diabetes, right bundle blockade, peripheral neuropathy, BPH, anemia, s/p of left BKA, CKD-3A, s/p of right second toe amputation due to to osteomyelitis, who presents with weakness and nausea. Patient has history of right second toe osteomyelitis.  Patient is s/p of right second toe amputation on 06/07/2021.  Patient was initially treated with doxycycline.  He states that he has not been feeling well in the past several days.  Patient has severe generalized weakness, poor appetite, decreased oral intake.  He has nausea and dry heaves, no diarrhea or abdominal pain.  No fever or chills.  Patient has mild dry cough, no chest pain or shortness of breath.  Denies symptoms of UTI. He states that he does not have worsening pain in in the surgical site.  No active drainage noted.  Patient was seen by his podiatrist yesterday, who thought that his wound looks okay.  Patient was started on Bactrim.Patient was initially hypotensive with blood pressure 82/40, which improved to 109/77 after giving 2 L normal saline bolus in the ED. X-ray of right foot showed s/p of second toe amputation without evidence of osteomyelitis.   CT of abdomen/pelvis that showed mild wall thickening of duodenum, indicating possible duodenitis.   Once patient was treated and clinically improved he was supposed to go to SNF initially.  COVID testing for SNF came  back positive.  Patient was placed on 10-day quarantine for rehab however today he declined going to SNF and wanted to go home.   Duodenitis Right lower extremity cellulitis Possible urinary tract infection Severe sepsis secondary to above Criteria met with leukocytosis, tachycardia, tachypnea, elevated lactic acid Initial hypotension resolved with IV fluid resuscitation Suspect that duodenitis is primary driver for infection No evidence of osteomyelitis Podiatry was consulted Wound nursing was consulted No surgical intervention from podiatry standpoint  completed antibiotic course with ciprofloxacin and flagyl          Covid +  Currently asymptomatic Received IV Remdesivir IS Told patient he needs to continue quarantine for a full 10 days      Essential hypertension Continue home meds     Normocytic anemia Suspect anemia of chronic kidney disease Stable, monitor periodically F/u with pcp       Acute on CKD (chronic kidney disease), stage IIIa::  Renal function close to baseline.  Recent baseline creatinine 1.15-1.39 At baseline currently    Abnormal liver function Patient has some mild abnormal liver function, likely due to sepsis.    Elevated troponin:   likely due to demand ischemia. -Echocardiogram   Normal EF. No wall motion abnormality. Found with mild dilatation of aortic root and ascending aorta on echo Need to f/u with cards as outpt          Discharge Diagnoses:  Principal Problem:   Severe sepsis ([place]) Active Problems:   Essential hypertension   Normocytic anemia   UTI (urinary tract infection)   Cellulitis of second toe of right foot   CKD (chronic kidney disease),  stage IIIa   Duodenitis   Elevated troponin   Abnormal LFTs    Discharge Instructions  Discharge Instructions     Call MD for:  temperature >100.4   Complete by: As directed    Diet - low sodium heart healthy   Complete by: As directed    Discharge wound care:    Complete by: As directed    As above   Increase activity slowly   Complete by: As directed       Allergies as of 06/23/2021       Reactions   Oxycodone Itching        Medication List     STOP taking these medications    doxycycline 100 MG tablet Commonly known as: VIBRA-TABS   loperamide 2 MG capsule Commonly known as: IMODIUM   meloxicam 15 MG tablet Commonly known as: MOBIC   sulfamethoxazole-trimethoprim 800-160 MG tablet Commonly known as: BACTRIM DS       TAKE these medications    acetaminophen 325 MG tablet Commonly known as: TYLENOL Take 2 tablets (650 mg total) by mouth every 6 (six) hours as needed for up to 14 days for mild pain, fever or headache (or Fever >/= 101).   diclofenac sodium 1 % Gel Commonly known as: VOLTAREN Apply 1 application topically 4 (four) times daily as needed (pain).   gabapentin 400 MG capsule Commonly known as: NEURONTIN Take 1 capsule (400 mg total) by mouth daily. Start taking on: June 24, 2021 What changed:  how much to take how to take this when to take this additional instructions   lidocaine 5 % Commonly known as: LIDODERM Place 1 patch onto the skin daily for 14 days. Remove & Discard patch within 12 hours or as directed by MD   lisinopril 10 MG tablet Commonly known as: ZESTRIL Take 1.5 tablets (15 mg total) by mouth daily.   multivitamin tablet Take 1 tablet by mouth daily.   ondansetron 8 MG tablet Commonly known as: Zofran Take 1 tablet (8 mg total) by mouth every 8 (eight) hours as needed for nausea or vomiting.   pantoprazole 40 MG tablet Commonly known as: PROTONIX Take 1 tablet (40 mg total) by mouth daily. Start taking on: June 24, 2021   Vitamin D3 125 MCG (5000 UT) Caps Take 5,000 Units by mouth daily.               Discharge Care Instructions  (From admission, onward)           Start     Ordered   06/23/21 0000  Discharge wound care:       Comments: As above    06/23/21 1157            Contact information for follow-up providers     Edrick Kins, DPM Follow up in 1 week(s).   Specialty: Podiatry Contact information: [address] [place] [address] [phone]         Park Liter P, DO Follow up in 1 week(s).   Specialty: Family Medicine Contact information: [place] [address] [phone]              Contact information for after-discharge care     [facility] Preferred SNF .   Service: Skilled Chiropodist information: [place] [phone]  Allergies  Allergen Reactions   Oxycodone Itching    Consultations: podiatry   Procedures/Studies: CT ABDOMEN PELVIS W CONTRAST  Result Date: 06/12/2021 CLINICAL DATA:  Abdominal pain, fever EXAM: CT ABDOMEN AND PELVIS WITH CONTRAST TECHNIQUE: Multidetector CT imaging of the abdomen and pelvis was performed using the standard protocol following bolus administration of intravenous contrast. CONTRAST:  130m OMNIPAQUE IOHEXOL 350 MG/ML SOLN COMPARISON:  None. FINDINGS: Lower chest: Mild bibasilar atelectasis. Hepatobiliary: 2.8 cm cyst in segment 6. Gallbladder is unremarkable. No intrahepatic or extrahepatic ductal dilatation. Pancreas: Within normal limits. Spleen: Within normal limits. Adrenals/Urinary Tract: Adrenal glands are within normal limits. Kidneys are within normal limits.  No hydronephrosis. Bladder is within normal limits. Stomach/Bowel: Stomach is within normal limits. Mild wall thickening involving the proximal duodenum with suspected small duodenal diverticulum (coronal image 46). No evidence of bowel obstruction. Normal appendix (series 2/image 71). No colonic wall thickening or inflammatory changes. Vascular/Lymphatic: No evidence of abdominal aortic aneurysm.  Atherosclerotic calcifications of the abdominal aorta and branch vessels. No suspicious abdominopelvic lymphadenopathy. Reproductive: Prostate is unremarkable. Other: No abdominopelvic ascites. Musculoskeletal: Degenerative changes of the visualized thoracolumbar spine. IMPRESSION: Mild wall thickening involving the proximal duodenum, raising the possibility of duodenitis. Adjacent small duodenal diverticulum. No evidence of bowel obstruction.  Normal appendix. Electronically Signed   By: SJulian HyM.D.   On: 06/12/2021 03:27   MR FOOT RIGHT WO CONTRAST  Result Date: 06/12/2021 CLINICAL DATA:  Swelling of the foot, diabetes. Recent (06/07/2021) amputation of the right second toe at the diaphyseal level of the proximal phalanx. EXAM: MRI OF THE RIGHT FOREFOOT WITHOUT CONTRAST TECHNIQUE: Multiplanar, multisequence MR imaging of the right forefoot was performed. No intravenous contrast was administered. COMPARISON:  06/12/2021 radiographs and MRI from 06/07/2021 FINDINGS: Despite efforts by the technologist and patient, prominent motion artifact is present on today's exam and could not be eliminated. This reduces exam sensitivity and specificity. Bones/Joint/Cartilage Amputation of the second toe at the level of the mid diaphysis of the proximal phalanx. Trace fluid signal intensity just distal to the bony margin on image 19 series 10 but no findings of active osteomyelitis involving the remainder of the proximal phalanx. Stable arthropathy between the middle cuneiform and the base of the second metatarsal, with associated marrow edema and erosion or subcortical cyst along the base of the second metatarsal on image 9 series 7. We again demonstrate substantial erosions along the medial, lateral, and distal articular margins of the first metatarsal head best depicted on the T1 weighted images such as images 10 through 13 of series 7. There is associated marrow edema in the left first metatarsal head and  distal shaft. Degenerative findings but no substantial degree of edema in the proximal phalanx of the great toe. Effusion of the first MTP joint is again noted particularly dorsally where there a septated and somewhat heterogeneous effusion on image 26 series 10 similar to prior. Mild sclerosis of the first digit medial sesamoid. Overall these findings are not changed from prior, with the erosions along the first metatarsal head potentially from gout/crystal arthropathy or septic joint. Small erosion or degenerative subcortical cyst in the head of the fifth metatarsal, image 12 series 7, similar to prior. There is low-level marrow edema in the proximal diaphysis of the fifth metatarsal which is unchanged and probably from stress reaction. Additional mild degenerative findings noted along the midfoot and [place] joint. Ligaments The Lisfranc ligament appears intact. Muscles and Tendons Flexor digitorum longus tenosynovitis and peritendinitis distally, mildly  increased from the 06/07/2021 exam. Regional muscular atrophy with low-level accentuated T2 signal along the regional musculature which is probably neuropathic. Soft tissues Mild dorsal subcutaneous edema along the forefoot. IMPRESSION: 1. Interval amputation of the second toe at the level of the mid diaphysis of the proximal phalanx, without evidence of osteomyelitis involving the remaining portion of the proximal phalanx. 2. Stable appearance of substantial erosive arthropathy at the first MTP joint especially involving the first metatarsal head, with associated marrow edema in the metatarsal head and distal shaft. Complex dorsal effusion. The appearance tends to favor gout arthropathy, but as before, infection is not readily excluded. No substantial change over the last 5 days. 3. Stable stress reaction in the fifth metatarsal, with low-level marrow edema. Small erosion or degenerative subcortical cyst in the head of the fifth metatarsal, stable. 4. Increased  flexor digitorum longus tenosynovitis and peritendinitis. 5. Mild dorsal subcutaneous edema along the forefoot, cellulitis is not excluded. Electronically Signed   By: Van Clines M.D.   On: 06/12/2021 12:13   MR FOOT RIGHT WO CONTRAST  Result Date: 06/07/2021 CLINICAL DATA:  Osteomyelitis right second toe EXAM: MRI OF THE RIGHT FOREFOOT WITHOUT CONTRAST TECHNIQUE: Multiplanar, multisequence MR imaging of the right forefoot was performed. No intravenous contrast was administered. COMPARISON:  X-ray 06/07/2021 FINDINGS: Bones/Joint/Cartilage Cortical destruction of the distal phalanx of the right second toe. Bone marrow edema throughout the middle phalanx of the second toe with associated confluent low T1 marrow signal. Preserved bone marrow signal intensity within the proximal phalanx of the second toe. Large complex first MTP joint effusion with multiple erosions of the first metatarsal head (series 7, images 12-13). Patchy bone marrow edema within the first metatarsal head and neck. Smaller erosions involve the base of the great toe proximal phalanx. Mild bone marrow edema within the proximal diaphysis of the fifth metatarsal without associated T1 marrow signal abnormality. Moderate degenerative changes throughout the foot. Reactive subchondral marrow signal changes are seen within the midfoot, most pronounced at the second tarsometatarsal joint. Ligaments Intact Lisfranc ligament. Marked capsular thickening of the first MTP joint. Muscles and Tendons Chronic denervation changes of the intrinsic foot musculature. No tenosynovial fluid collection. Soft tissues Generalized soft tissue edema.  No organized fluid collection. IMPRESSION: 1. Acute osteomyelitis of the distal phalanx and middle phalanx of the right second toe. 2. Large complex first MTP joint effusion with multiple erosions of the first metatarsal head and base of the great toe proximal phalanx. Findings may reflect an underlying inflammatory  or crystalline arthropathy such as gout. Septic arthritis with osteomyelitis are not excluded. 3. Mild bone marrow edema within the proximal diaphysis of the fifth metatarsal without associated T1 marrow signal abnormality. Findings are favored to represent stress related changes. No definite evidence of osteomyelitis at this time. 4. Moderate degenerative changes throughout the foot. Electronically Signed   By: Davina Poke D.O.   On: 06/07/2021 09:53   DG Chest Portable 1 View  Result Date: 06/12/2021 CLINICAL DATA:  Weakness EXAM: PORTABLE CHEST 1 VIEW COMPARISON:  None. FINDINGS: Lungs are clear.  No pleural effusion or pneumothorax. The heart is top-normal in size. IMPRESSION: No evidence of acute cardiopulmonary disease. Electronically Signed   By: Julian Hy M.D.   On: 06/12/2021 01:55   DG Foot Complete Right  Result Date: 06/12/2021 CLINICAL DATA:  Right toe amputation on 10/16 for osteo, weakness EXAM: RIGHT FOOT COMPLETE - 3+ VIEW COMPARISON:  None. FINDINGS: No fracture or dislocation is seen. Status  post amputation of the 2nd digit at the level of the base of the proximal phalanx. Mild soft tissue swelling. The joint spaces are preserved. IMPRESSION: Status post 2nd digit amputation. No acute findings. Electronically Signed   By: Julian Hy M.D.   On: 06/12/2021 01:55   DG Foot Complete Right  Result Date: 06/07/2021 CLINICAL DATA:  Postop radiograph EXAM: RIGHT FOOT COMPLETE - 3+ VIEW COMPARISON:  Right foot radiographs 06/07/2021 FINDINGS: Status post amputation of the second toe at the level of the proximal phalanx mid shaft. No acute bony finding. There are postoperative changes in the regional soft tissues. IMPRESSION: Status post amputation of the second toe at the level of the proximal phalanx. Electronically Signed   By: Audie Pinto M.D.   On: 06/07/2021 10:46   DG Foot Complete Right  Result Date: 06/07/2021 CLINICAL DATA:  Right 2nd toe infection  EXAM: RIGHT FOOT COMPLETE - 3+ VIEW COMPARISON:  Was FINDINGS: No fracture or dislocation is seen. Mild degenerative changes of the 1st MTP joint. Osteolysis of the 2nd distal phalanx with overlying soft tissue swelling. IMPRESSION: Osteolysis of the 2nd distal phalanx with overlying soft tissue swelling. Electronically Signed   By: Julian Hy M.D.   On: 06/07/2021 03:44   ECHOCARDIOGRAM COMPLETE  Result Date: 06/15/2021    ECHOCARDIOGRAM REPORT   Patient Name:   Daniel Leon Date of Exam: 06/15/2021 Medical Rec #:  [id number]          Height:       76.0 in Accession #:    [id number]         Weight:       249.0 lb Date of Birth:  [birth year]          BSA:          2.431 m Patient Age:    47 years           BP:           126/76 mmHg Patient Gender: M                  HR:           87 bpm. Exam Location:  [facility] Procedure: 2D Echo, Cardiac Doppler and Color Doppler Indications:     Abnormal ECG R94.31  History:         Patient has no prior history of Echocardiogram examinations.                  Arrythmias:RBBB; Risk Factors:Diabetes and Hypertension.  Sonographer:     Sherrie Sport Referring Phys:  [id number] Sidney Ace Diagnosing Phys: Kathlyn Sacramento MD IMPRESSIONS  1. Left ventricular ejection fraction, by estimation, is 60 to 65%. The left ventricle has normal function. The left ventricle has no regional wall motion abnormalities. There is moderate left ventricular hypertrophy. Left ventricular diastolic function  could not be evaluated.  2. Right ventricular systolic function is normal. The right ventricular size is normal. Tricuspid regurgitation signal is inadequate for assessing PA pressure.  3. The mitral valve is normal in structure. No evidence of mitral valve regurgitation. No evidence of mitral stenosis.  4. The aortic valve is normal in structure. Aortic valve regurgitation is not visualized. Mild aortic valve sclerosis is present, with no evidence of aortic valve stenosis.  5. Aortic  dilatation noted. There is mild dilatation of the aortic root and of the ascending aorta, measuring 40 mm.  6. No apical images. FINDINGS  Left  Ventricle: Left ventricular ejection fraction, by estimation, is 60 to 65%. The left ventricle has normal function. The left ventricle has no regional wall motion abnormalities. The left ventricular internal cavity size was normal in size. There is  moderate left ventricular hypertrophy. Left ventricular diastolic function could not be evaluated. Right Ventricle: The right ventricular size is normal. No increase in right ventricular wall thickness. Right ventricular systolic function is normal. Tricuspid regurgitation signal is inadequate for assessing PA pressure. Left Atrium: Left atrial size was normal in size. Right Atrium: Right atrial size was normal in size. Pericardium: Trivial pericardial effusion is present. Mitral Valve: The mitral valve is normal in structure. No evidence of mitral valve regurgitation. No evidence of mitral valve stenosis. Tricuspid Valve: The tricuspid valve is normal in structure. Tricuspid valve regurgitation is not demonstrated. No evidence of tricuspid stenosis. Aortic Valve: The aortic valve is normal in structure. Aortic valve regurgitation is not visualized. Mild aortic valve sclerosis is present, with no evidence of aortic valve stenosis. Pulmonic Valve: The pulmonic valve was normal in structure. Pulmonic valve regurgitation is mild. No evidence of pulmonic stenosis. Aorta: Aortic dilatation noted. There is mild dilatation of the aortic root and of the ascending aorta, measuring 40 mm. Venous: The inferior vena cava was not well visualized. IAS/Shunts: No atrial level shunt detected by color flow Doppler.  LEFT VENTRICLE PLAX 2D LVIDd:         4.40 cm LVIDs:         3.20 cm LV PW:         1.60 cm LV IVS:        1.50 cm LVOT diam:     2.20 cm LVOT Area:     3.80 cm  LEFT ATRIUM         Index LA diam:    2.70 cm 1.11 cm/m                         PULMONIC VALVE AORTA                 PV Vmax:        0.36 m/s Ao Root diam: 3.87 cm PV Vmean:       54.300 cm/s                       PV VTI:         0.133 m                       PV Peak grad:   0.5 mmHg                       PV Mean grad:   1.0 mmHg                       RVOT Peak grad: 3 mmHg   SHUNTS Systemic Diam: 2.20 cm Pulmonic VTI:  0.141 m Kathlyn Sacramento MD Electronically signed by Kathlyn Sacramento MD Signature Date/Time: 06/15/2021/1:07:56 PM    Final       Subjective: No sob, cp, cough, or abd pain  Discharge Exam: Vitals:   06/23/21 0451 06/23/21 0859  BP: 122/64 128/70  Pulse: 87 86  Resp: (!) 22 18  Temp: 97.6 F (36.4 C) 98 F (36.7 C)  SpO2: 94% 93%   Vitals:   06/22/21 1631 06/22/21 2019 06/23/21 0451 06/23/21 0859  BP: 106/65 (!) 120/57 122/64 128/70  Pulse: 87 92 87 86  Resp: 16 18 (!) 22 18  Temp: 97.7 F (36.5 C) (!) 97.4 F (36.3 C) 97.6 F (36.4 C) 98 F (36.7 C)  TempSrc: Oral Oral Oral Oral  SpO2: 98% 97% 94% 93%  Weight:      Height:        General: Pt is alert, awake, not in acute distress Cardiovascular: RRR, S1/S2 +, no rubs, no gallops Respiratory: CTA no w/r Abdominal: Soft, NT, ND, bowel sounds + Extremities: RLE dressing in place. LT amputated    The results of significant diagnostics from this hospitalization (including imaging, microbiology, ancillary and laboratory) are listed below for reference.     Microbiology: Recent Results (from the past 240 hour(s))  Resp Panel by RT-PCR (Flu A&B, Covid) Nasopharyngeal Swab     Status: Abnormal   Collection Time: 06/17/21  2:09 PM   Specimen: Nasopharyngeal Swab; Nasopharyngeal(NP) swabs in vial transport medium  Result Value Ref Range Status   SARS Coronavirus 2 by RT PCR POSITIVE (A) NEGATIVE Final    Comment: CRITICAL RESULT CALLED TO, READ BACK BY AND VERIFIED WITH: SUSAN HINO 06/17/21 1528 MU (NOTE) SARS-CoV-2 target nucleic acids are DETECTED.  The SARS-CoV-2 RNA is  generally detectable in upper respiratory specimens during the acute phase of infection. Positive results are indicative of the presence of the identified virus, but do not rule out bacterial infection or co-infection with other pathogens not detected by the test. Clinical correlation with patient history and other diagnostic information is necessary to determine patient infection status. The expected result is Negative.  Fact Sheet for Patients: EntrepreneurPulse.com.au  Fact Sheet for Healthcare Providers: IncredibleEmployment.be  This test is not yet approved or cleared by the [place] FDA and  has been authorized for detection and/or diagnosis of SARS-CoV-2 by FDA under an Emergency Use Authorization (EUA).  This EUA will remain in effect (meaning this test can  be used) for the duration of  the COVID-19 declaration under Section 564(b)(1) of the Act, 21 U.S.C. section 360bbb-3(b)(1), unless the authorization is terminated or revoked sooner.     Influenza A by PCR NEGATIVE NEGATIVE Final   Influenza B by PCR NEGATIVE NEGATIVE Final    Comment: (NOTE) The Xpert Xpress SARS-CoV-2/FLU/RSV plus assay is intended as an aid in the diagnosis of influenza from Nasopharyngeal swab specimens and should not be used as a sole basis for treatment. Nasal washings and aspirates are unacceptable for Xpert Xpress SARS-CoV-2/FLU/RSV testing.  Fact Sheet for Patients: EntrepreneurPulse.com.au  Fact Sheet for Healthcare Providers: IncredibleEmployment.be  This test is not yet approved or cleared by the [place] FDA and has been authorized for detection and/or diagnosis of SARS-CoV-2 by FDA under an Emergency Use Authorization (EUA). This EUA will remain in effect (meaning this test can be used) for the duration of the COVID-19 declaration under Section 564(b)(1) of the Act, 21 U.S.C. section 360bbb-3(b)(1),  unless the authorization is terminated or revoked.  Performed at [facility], [address] [place], [place] [address]      Labs: BNP (last 3 results) Recent Labs    06/12/21 0120  BNP 182.9*   Basic Metabolic Panel: Recent Labs  Lab 06/17/21 0450 06/18/21 0646 06/19/21 0718 06/20/21 0459 06/22/21 1442  NA 134* 132* 132*  --   --   K 4.0 4.3 4.5  --   --   CL 99 97* 98  --   --  CO2 _0 --   --   GLUCOSE 125* 139* 154*  --   --   BUN 22 24* 25*  --   --   CREATININE 1.15 1.26* 1.35* 1.28* 1.33*  CALCIUM 8.9 8.9 8.8*  --   --    Liver Function Tests: No results for input(s): AST, ALT, ALKPHOS, BILITOT, PROT, ALBUMIN in the last 168 hours. No results for input(s): LIPASE, AMYLASE in the last 168 hours. No results for input(s): AMMONIA in the last 168 hours. CBC: Recent Labs  Lab 06/17/21 0450 06/18/21 0646 06/19/21 0718  WBC 10.7* 11.4* 9.8  NEUTROABS 6.3 7.0 6.1  HGB 11.1* 10.8* 11.3*  HCT 33.1* 32.7* 33.6*  MCV 86.9 86.5 87.0  PLT 403* 414* 438*   Cardiac Enzymes: No results for input(s): CKTOTAL, CKMB, CKMBINDEX, TROPONINI in the last 168 hours. BNP: Invalid input(s): POCBNP CBG: No results for input(s): GLUCAP in the last 168 hours. D-Dimer No results for input(s): DDIMER in the last 72 hours. Hgb A1c No results for input(s): HGBA1C in the last 72 hours. Lipid Profile No results for input(s): CHOL, HDL, LDLCALC, TRIG, CHOLHDL, LDLDIRECT in the last 72 hours. Thyroid function studies No results for input(s): TSH, T4TOTAL, T3FREE, THYROIDAB in the last 72 hours.  Invalid input(s): FREET3 Anemia work up No results for input(s): VITAMINB12, FOLATE, FERRITIN, TIBC, IRON, RETICCTPCT in the last 72 hours. Urinalysis    Component Value Date/Time   COLORURINE YELLOW (A) 06/12/2021 0338   APPEARANCEUR CLEAR (A) 06/12/2021 0338   APPEARANCEUR Clear 06/11/2021 1507   LABSPEC 1.032 (H) 06/12/2021 0338   PHURINE 5.0 06/12/2021 0338    GLUCOSEU NEGATIVE 06/12/2021 0338   HGBUR SMALL (A) 06/12/2021 0338   BILIRUBINUR NEGATIVE 06/12/2021 0338   BILIRUBINUR Negative 06/11/2021 1507   KETONESUR 5 (A) 06/12/2021 0338   PROTEINUR 30 (A) 06/12/2021 0338   NITRITE NEGATIVE 06/12/2021 0338   LEUKOCYTESUR TRACE (A) 06/12/2021 0338   Sepsis Labs Invalid input(s): PROCALCITONIN,  WBC,  LACTICIDVEN Microbiology Recent Results (from the past 240 hour(s))  Resp Panel by RT-PCR (Flu A&B, Covid) Nasopharyngeal Swab     Status: Abnormal   Collection Time: 06/17/21  2:09 PM   Specimen: Nasopharyngeal Swab; Nasopharyngeal(NP) swabs in vial transport medium  Result Value Ref Range Status   SARS Coronavirus 2 by RT PCR POSITIVE (A) NEGATIVE Final    Comment: CRITICAL RESULT CALLED TO, READ BACK BY AND VERIFIED WITH: SUSAN HINO 06/17/21 1528 MU (NOTE) SARS-CoV-2 target nucleic acids are DETECTED.  The SARS-CoV-2 RNA is generally detectable in upper respiratory specimens during the acute phase of infection. Positive results are indicative of the presence of the identified virus, but do not rule out bacterial infection or co-infection with other pathogens not detected by the test. Clinical correlation with patient history and other diagnostic information is necessary to determine patient infection status. The expected result is Negative.  Fact Sheet for Patients: EntrepreneurPulse.com.au  Fact Sheet for Healthcare Providers: IncredibleEmployment.be  This test is not yet approved or cleared by the [place] FDA and  has been authorized for detection and/or diagnosis of SARS-CoV-2 by FDA under an Emergency Use Authorization (EUA).  This EUA will remain in effect (meaning this test can  be used) for the duration of  the COVID-19 declaration under Section 564(b)(1) of the Act, 21 U.S.C. section 360bbb-3(b)(1), unless the authorization is terminated or revoked sooner.     Influenza A by PCR  NEGATIVE NEGATIVE Final   Influenza  B by PCR NEGATIVE NEGATIVE Final    Comment: (NOTE) The Xpert Xpress SARS-CoV-2/FLU/RSV plus assay is intended as an aid in the diagnosis of influenza from Nasopharyngeal swab specimens and should not be used as a sole basis for treatment. Nasal washings and aspirates are unacceptable for Xpert Xpress SARS-CoV-2/FLU/RSV testing.  Fact Sheet for Patients: EntrepreneurPulse.com.au  Fact Sheet for Healthcare Providers: IncredibleEmployment.be  This test is not yet approved or cleared by the [place] FDA and has been authorized for detection and/or diagnosis of SARS-CoV-2 by FDA under an Emergency Use Authorization (EUA). This EUA will remain in effect (meaning this test can be used) for the duration of the COVID-19 declaration under Section 564(b)(1) of the Act, 21 U.S.C. section 360bbb-3(b)(1), unless the authorization is terminated or revoked.  Performed at [facility], [address], [place], [place] [address]      Time coordinating discharge: Over 30 minutes  SIGNED:   Nolberto Hanlon, MD  [facility] 06/23/2021, 11:58 AM Pager   If 7PM-7AM, please contact night-coverage [email or web address] Password [email or web address]

## 2021-06-23 NOTE — Progress Notes (Signed)
Occupational Therapy Treatment Patient Details Name: Daniel Leon MRN: [id number] DOB: 20-Mar-1951 Today's Date: 06/23/2021   History of present illness 70 y.o. male with medical history significant of hypertension, diet-controlled diabetes, right bundle blockade, peripheral neuropathy, BPH, anemia, s/p of left BKA, CKD, s/p of right second toe amputation (10/16), who presented to [facility] with weakness and nausea. Pt admitted for sepsis secondary to duodenitis   OT comments  Pt seen for OT treatment on this date. Upon arrival to room, pt awake and seated upright in bed. Pt reporting no pain and agreeable to OT tx. Pt currently presents with decreased strength and balance, however demonstrates significant progress since initial evaluation. Pt currently requires SUPERVISION for BSC transfers, SUPERVISION for 3 standing grooming tasks, and SUPERVISION for functional mobility of short household distances with RW. Pt denied dizziness throughout. Pt engaged in seated UE/LE exercises (see below), demonstrating good carryover of HEP provided in previous sessions. Pt continues to benefit from skilled OT services to maximize return to PLOF, however is safe to discharge home with HHOT and supervision/assistance from family. Discharge plan updated; MD informed and pt in agreement.    Recommendations for follow up therapy are one component of a multi-disciplinary discharge planning process, led by the attending physician.  Recommendations may be updated based on patient status, additional functional criteria and insurance authorization.    Follow Up Recommendations  Home health OT    Assistance Recommended at Discharge Intermittent Supervision/Assistance  Equipment Recommendations  None recommended by OT       Precautions / Restrictions Precautions Precautions: Fall Restrictions Weight Bearing Restrictions: Yes RLE Weight Bearing: Weight bearing as tolerated Other Position/Activity Restrictions: in  post op shoe       Mobility Bed Mobility Overal bed mobility: Needs Assistance             General bed mobility comments: Pt received/left in chair.    Transfers Overall transfer level: Needs assistance Equipment used: Rolling walker (2 wheels) Transfers: Sit to/from Stand Sit to Stand: Supervision           General transfer comment: pt demonstrating safe technique throughout     Balance Overall balance assessment: Needs assistance Sitting-balance support: No upper extremity supported;Feet supported Sitting balance-Leahy Scale: Good     Standing balance support: No upper extremity supported;During functional activity Standing balance-Leahy Scale: Good Standing balance comment: Good standing balance during standing grooming tasks                           ADL either performed or assessed with clinical judgement   ADL Overall ADL's : Needs assistance/impaired     Grooming: Wash/dry hands;Wash/dry face;Oral care;Supervision/safety;Set up;Standing               Lower Body Dressing: Supervision/safety;Set up   Toilet Transfer: Supervision/safety;Set up;BSC;Rolling walker (2 wheels);Ambulation           Functional mobility during ADLs: Supervision/safety;Set up;Rolling walker (2 wheels)        Cognition Arousal/Alertness: Awake/alert Behavior During Therapy: WFL for tasks assessed/performed Overall Cognitive Status: Within Functional Limits for tasks assessed                                            Exercises General Exercises - Upper Extremity Shoulder Flexion: AROM;Strengthening;Both;10 reps;Seated Chair Push Up: Strengthening;Both;10 reps;Seated General Exercises - Lower  Extremity Long Arc Quad: AROM;Strengthening;Both;20 reps;Seated Hip Flexion/Marching: Strengthening;Both;20 reps;Seated      General Comments Pt denying dizziness throughout    Pertinent Vitals/ Pain       Pain Assessment: No/denies  pain         Frequency  Min 2X/week        Progress Toward Goals  OT Goals(current goals can now be found in the care plan section)  Progress towards OT goals: Progressing toward goals  Acute Rehab OT Goals OT Goal Formulation: With patient Time For Goal Achievement: 06/28/21 Potential to Achieve Goals: Good  Plan Discharge plan needs to be updated;Frequency remains appropriate       AM-PAC OT "6 Clicks" Daily Activity     Outcome Measure   Help from another person eating meals?: None Help from another person taking care of personal grooming?: A Little Help from another person toileting, which includes using toliet, bedpan, or urinal?: A Little Help from another person bathing (including washing, rinsing, drying)?: A Little Help from another person to put on and taking off regular upper body clothing?: None Help from another person to put on and taking off regular lower body clothing?: A Little 6 Click Score: 20    End of Session Equipment Utilized During Treatment: Rolling walker (2 wheels)  OT Visit Diagnosis: Other abnormalities of gait and mobility (R26.89);Muscle weakness (generalized) (M62.81)   Activity Tolerance Patient tolerated treatment well   Patient Left in chair;with call bell/phone within reach   Nurse Communication Mobility status        Time: 1106-1130 OT Time Calculation (min): 24 min  Charges: OT General Charges $OT Visit: 1 Visit OT Treatments $Self Care/Home Management : 8-22 mins $Therapeutic Activity: 8-22 mins  Matthew Folks, OTR/L [facility] [phone]

## 2021-06-25 ENCOUNTER — Ambulatory Visit: Payer: Medicare PPO | Admitting: Podiatry

## 2021-06-25 ENCOUNTER — Telehealth: Payer: Self-pay

## 2021-06-25 NOTE — Telephone Encounter (Signed)
Transition Care Management Follow-up Telephone Call Date of discharge and from where: 06/23/2021 [facility] How have you been since you were released from the hospital? Much better Any questions or concerns? No  Items Reviewed: Did the pt receive and understand the discharge instructions provided? Yes  Medications obtained and verified? Yes  Other? No  Any new allergies since your discharge? No  Dietary orders reviewed? Yes Do you have support at home? Yes   Home Care and Equipment/Supplies: Were home health services ordered? yes If so, what is the name of the agency?   Has the agency set up a time to come to the patient's home? yes Were any new equipment or medical supplies ordered?  No What is the name of the medical supply agency? N/a Were you able to get the supplies/equipment? not applicable Do you have any questions related to the use of the equipment or supplies? No  Functional Questionnaire: (I = Independent and D = Dependent) ADLs: I  Bathing/Dressing- I  Meal Prep- I  Eating- I  Maintaining continence- I  Transferring/Ambulation- I  Managing Meds- I  Follow up appointments reviewed:  PCP Hospital f/u appt confirmed? Yes  Scheduled to see Dr. Laural Benes on 07/03/2021 @ 1:40. Are transportation arrangements needed? No  If their condition worsens, is the pt aware to call PCP or go to the Emergency Dept.? Yes Was the patient provided with contact information for the PCP's office or ED? Yes Was to pt encouraged to call back with questions or concerns? Yes

## 2021-07-01 ENCOUNTER — Ambulatory Visit: Payer: Self-pay | Admitting: *Deleted

## 2021-07-01 DIAGNOSIS — R197 Diarrhea, unspecified: Secondary | ICD-10-CM

## 2021-07-01 NOTE — Telephone Encounter (Signed)
Summary: Questions for nurse, stool collection   Pt's wife called and is concerned about patient's digestive health, he was just in the hospital for 10 days and the home health nurse believes he needs to take a stool specimen to check for infection. The nurse has given them a kit for stool collection.   Pt's wife wants to speak to nurse, she has questions and wants this done asap.   Best contact: [phone]     Reason for Disposition  [1] Recent antibiotic therapy (i.e., within last 2 months) AND [2] diarrhea present > 3 days since antibiotic was stopped  Answer Assessment - Initial Assessment Questions 1. DIARRHEA SEVERITY: "How bad is the diarrhea?" "How many more stools have you had in the past 24 hours than normal?"    - NO DIARRHEA (SCALE 0)   - MILD (SCALE 1-3): Few loose or mushy BMs; increase of 1-3 stools over normal daily number of stools; mild increase in ostomy output.   -  MODERATE (SCALE 4-7): Increase of 4-6 stools daily over normal; moderate increase in ostomy output. * SEVERE (SCALE 8-10; OR 'WORST POSSIBLE'): Increase of 7 or more stools daily over normal; moderate increase in ostomy output; incontinence.     mild 2. ONSET: "When did the diarrhea begin?"      3 days 3. BM CONSISTENCY: "How loose or watery is the diarrhea?"      Loose not watery 4. VOMITING: "Are you also vomiting?" If Yes, ask: "How many times in the past 24 hours?"      no 5. ABDOMINAL PAIN: "Are you having any abdominal pain?" If Yes, ask: "What does it feel like?" (e.g., crampy, dull, intermittent, constant)      no 6. ABDOMINAL PAIN SEVERITY: If present, ask: "How bad is the pain?"  (e.g., Scale 1-10; mild, moderate, or severe)   - MILD (1-3): doesn't interfere with normal activities, abdomen soft and not tender to touch    - MODERATE (4-7): interferes with normal activities or awakens from sleep, abdomen tender to touch    - SEVERE (8-10): excruciating pain, doubled over, unable to do any normal  activities       na 7. ORAL INTAKE: If vomiting, "Have you been able to drink liquids?" "How much liquids have you had in the past 24 hours?"     Yes- hydrating well 8. HYDRATION: "Any signs of dehydration?" (e.g., dry mouth [not just dry lips], too weak to stand, dizziness, new weight loss) "When did you last urinate?"     no 9. EXPOSURE: "Have you traveled to a foreign country recently?" "Have you been exposed to anyone with diarrhea?" "Could you have eaten any food that was spoiled?"     Recent hx hospitalization - 10 days- diagnosis- duodenitis  10. ANTIBIOTIC USE: "Are you taking antibiotics now or have you taken antibiotics in the past 2 months?"       Recent antibiotic 11. OTHER SYMPTOMS: "Do you have any other symptoms?" (e.g., fever, blood in stool)       no 12. PREGNANCY: "Is there any chance you are pregnant?" "When was your last menstrual period?"       na  Protocols used: [facility]

## 2021-07-01 NOTE — Telephone Encounter (Signed)
Patient's wife is calling to request lab order. Patient recently hospitalized for 10 days- duodenitis. He has been home 1 week. Patient has noticed loose stool- not watery for 3 days- home nurse left stool kit for sample. Patient would like to go ahead and collect sample and bring to lab for culture before the Friday appointment if PCP thinks that would be appropriate. Patient reports no other symptoms at this time.( Only using routine medications at this time) Advised would check with provider and office would call her back.

## 2021-07-01 NOTE — Telephone Encounter (Signed)
Unfortunately there are specific containers that need to be used for different stool samples. I can put in the order and they can come pick up the containers, but I'm not sure what the nurse gave them, so I'm not sure how that would work at the lab. I'll put the orders in and someone can come pick up a stool kit that they can fill and bring in.

## 2021-07-02 ENCOUNTER — Ambulatory Visit: Payer: Medicare PPO | Admitting: Podiatry

## 2021-07-02 ENCOUNTER — Telehealth: Payer: Self-pay | Admitting: Family Medicine

## 2021-07-02 ENCOUNTER — Other Ambulatory Visit: Payer: Self-pay

## 2021-07-02 DIAGNOSIS — Z89421 Acquired absence of other right toe(s): Secondary | ICD-10-CM

## 2021-07-02 DIAGNOSIS — R197 Diarrhea, unspecified: Secondary | ICD-10-CM | POA: Diagnosis not present

## 2021-07-02 NOTE — Telephone Encounter (Signed)
Spoke with patient to informed him of Dr.Johnson's recommendations on stool samples. Patient states he has discussed it with provider and states his wife is currently on the way to our office to grab the necessary supplies needed for the stool sample order. Patient was advised to give our office a call back if he or his wife has any questions or concerns. Patient verbalized understanding.

## 2021-07-02 NOTE — Telephone Encounter (Signed)
Copied from CRM #[id number]. Topic: General - Inquiry >> Jul 02, 2021  8:55 AM Bell, Tiffany M wrote: Caller following up on PCP recommendation regarding stool kit. (Please reference Nurse Triage 07/01/2021 Telephone Encounter ) Caller would like a follow up call 

## 2021-07-02 NOTE — Progress Notes (Signed)
Subjective:  Patient ID: Daniel Leon, male    DOB: 04-10-1951,  MRN: [id number]  Chief Complaint  Patient presents with   Routine Post Op    Removal of stitches     DOS: 06/08/2019 Procedure: Right partial second toe amputation  70 y.o. male returns for post-op check.  Patient states that he is doing well.  No acute complaints.  The incision looks good.  Bandages clean dry and intact  Review of Systems: Negative except as noted in the HPI. Denies N/V/F/Ch.  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    Charcot ankle, left    collapse   Charcot's joint of foot    Diabetes mellitus without complication (HCC)    DIET-CONTROLLED   Early cataracts, bilateral    Essential hypertension    Right bundle branch block (RBBB)    Traumatic arthropathy of ankle and foot    Wears glasses     Current Outpatient Medications:    acetaminophen (TYLENOL) 325 MG tablet, Take 2 tablets (650 mg total) by mouth every 6 (six) hours as needed for up to 14 days for mild pain, fever or headache (or Fever >/= 101)., Disp: 60 tablet, Rfl: 0   Cholecalciferol (VITAMIN D3) 125 MCG (5000 UT) CAPS, Take 5,000 Units by mouth daily., Disp: , Rfl:    diclofenac sodium (VOLTAREN) 1 % GEL, Apply 1 application topically 4 (four) times daily as needed (pain)., Disp: , Rfl:    gabapentin (NEURONTIN) 400 MG capsule, Take 1 capsule (400 mg total) by mouth daily., Disp: 30 capsule, Rfl: 0   lidocaine (LIDODERM) 5 %, Place 1 patch onto the skin daily for 14 days. Remove & Discard patch within 12 hours or as directed by MD, Disp: 14 patch, Rfl: 0   lisinopril (ZESTRIL) 10 MG tablet, Take 1.5 tablets (15 mg total) by mouth daily., Disp: 135 tablet, Rfl: 1   Multiple Vitamin (MULTIVITAMIN) tablet, Take 1 tablet by mouth daily., Disp: , Rfl:    ondansetron (ZOFRAN) 8 MG tablet, Take 1 tablet (8 mg total) by mouth every 8 (eight) hours as needed for nausea or vomiting.,  Disp: 20 tablet, Rfl: 1   pantoprazole (PROTONIX) 40 MG tablet, Take 1 tablet (40 mg total) by mouth daily., Disp: 30 tablet, Rfl: 0  Social History   Tobacco Use  Smoking Status Former   Packs/day: 0.50   Years: 10.00   Pack years: 5.00   Types: Cigarettes   Quit date: 07/27/2000   Years since quitting: 20.9  Smokeless Tobacco Never    Allergies  Allergen Reactions   Oxycodone Itching   Objective:  There were no vitals filed for this visit. There is no height or weight on file to calculate BMI. Constitutional Well developed. Well nourished.  Vascular Foot warm and well perfused. Capillary refill normal to all digits.   Neurologic Normal speech. Oriented to person, place, and time. Epicritic sensation to light touch grossly present bilaterally.  Dermatologic Skin completely epithelialized.  No complication noted.  Orthopedic: No further tenderness to palpation noted about the surgical site.   Radiographs: 3 views of skeletally mature the right foot: Sharp surgical margin noted of the prior x-ray.  No signs of osteomyelitis noted. Assessment:   1. History of amputation of lesser toe of right foot (HCC)     Plan:  Patient was evaluated and treated and all questions answered.  S/p foot surgery right -Progressing as expected post-operatively. -  XR: See above -WB Status: Transition to regular shoes -Sutures: Removed no clinical signs of dehiscence noted.  No complication noted. -Medications: None -Patient is officially discharged from my care.  I discussed with him the importance of shoe gear modification.  If any foot and ankle issues are in the future asked him to come see me.  He states understanding  No follow-ups on file.

## 2021-07-02 NOTE — Addendum Note (Signed)
Addended by: Dorcas Carrow on: 07/02/2021 08:14 AM   Modules accepted: Orders

## 2021-07-03 ENCOUNTER — Encounter: Payer: Self-pay | Admitting: Family Medicine

## 2021-07-03 ENCOUNTER — Ambulatory Visit (INDEPENDENT_AMBULATORY_CARE_PROVIDER_SITE_OTHER): Payer: Medicare PPO | Admitting: Family Medicine

## 2021-07-03 VITALS — BP 108/70 | HR 93 | Temp 97.7°F | Wt 235.8 lb

## 2021-07-03 DIAGNOSIS — U071 COVID-19: Secondary | ICD-10-CM

## 2021-07-03 DIAGNOSIS — R197 Diarrhea, unspecified: Secondary | ICD-10-CM | POA: Diagnosis not present

## 2021-07-03 DIAGNOSIS — R638 Other symptoms and signs concerning food and fluid intake: Secondary | ICD-10-CM | POA: Diagnosis not present

## 2021-07-03 DIAGNOSIS — I77819 Aortic ectasia, unspecified site: Secondary | ICD-10-CM

## 2021-07-03 DIAGNOSIS — K2980 Duodenitis without bleeding: Secondary | ICD-10-CM

## 2021-07-03 DIAGNOSIS — R231 Pallor: Secondary | ICD-10-CM | POA: Diagnosis not present

## 2021-07-03 DIAGNOSIS — R945 Abnormal results of liver function studies: Secondary | ICD-10-CM

## 2021-07-03 DIAGNOSIS — Z89421 Acquired absence of other right toe(s): Secondary | ICD-10-CM

## 2021-07-03 DIAGNOSIS — E1142 Type 2 diabetes mellitus with diabetic polyneuropathy: Secondary | ICD-10-CM

## 2021-07-03 MED ORDER — METRONIDAZOLE 500 MG PO TABS: 500.0000 mg | ORAL_TABLET | Freq: Two times a day (BID) | ORAL | 0 refills | Status: DC

## 2021-07-03 NOTE — Progress Notes (Signed)
BP 108/70   Pulse 93   Temp 97.7 F (36.5 C)   Wt 235 lb 12.8 oz (107 kg)   SpO2 97%   BMI 28.70 kg/m    Subjective:    Patient ID: Daniel Leon, male    DOB: 29-Mar-1951, 70 y.o.   MRN: [id number]  HPI: Daniel Leon is a 70 y.o. male  Chief Complaint  Patient presents with   Hospitalization Follow-up   Diarrhea    Patient states for about 4 days he has been having diarrhea    Transition of [facility] Follow up.   Hospital/Facility: [facility] D/C Physician: Dr. Kurtis Bushman D/C Date: 06/23/21  Records Requested: 07/03/21 Records Received: 07/03/21 Records Reviewed: 07/03/21  Diagnoses on Discharge: Duodenitis Right lower extremity cellulitis Possible urinary tract infection Severe sepsis secondary to above  Date of interactive Contact within 48 hours of discharge: 06/25/21 Contact was through: phone  Date of 7 day or 14 day face-to-face visit: 07/03/21 within 14 days  Outpatient Encounter Medications as of 07/03/2021  Medication Sig   acetaminophen (TYLENOL) 325 MG tablet Take 2 tablets (650 mg total) by mouth every 6 (six) hours as needed for up to 14 days for mild pain, fever or headache (or Fever >/= 101).   Cholecalciferol (VITAMIN D3) 125 MCG (5000 UT) CAPS Take 5,000 Units by mouth daily.   diclofenac sodium (VOLTAREN) 1 % GEL Apply 1 application topically 4 (four) times daily as needed (pain).   gabapentin (NEURONTIN) 400 MG capsule Take 1 capsule (400 mg total) by mouth daily.   lisinopril (ZESTRIL) 10 MG tablet Take 1.5 tablets (15 mg total) by mouth daily.   metroNIDAZOLE (FLAGYL) 500 MG tablet Take 1 tablet (500 mg total) by mouth 2 (two) times daily.   Multiple Vitamin (MULTIVITAMIN) tablet Take 1 tablet by mouth daily.   ondansetron (ZOFRAN) 8 MG tablet Take 1 tablet (8 mg total) by mouth every 8 (eight) hours as needed for nausea or vomiting.   pantoprazole (PROTONIX) 40 MG tablet Take 1 tablet (40 mg total) by mouth daily.   [DISCONTINUED]  lidocaine (LIDODERM) 5 % Place 1 patch onto the skin daily for 14 days. Remove & Discard patch within 12 hours or as directed by MD (Patient not taking: Reported on 07/03/2021)   No facility-administered encounter medications on file as of 07/03/2021.  Per Hospitalist: "Per HPI: Daniel Leon is a 70 y.o. male with medical history significant of hypertension, diet-controlled diabetes, right bundle blockade, peripheral neuropathy, BPH, anemia, s/p of left BKA, CKD-3A, s/p of right second toe amputation due to to osteomyelitis, who presents with weakness and nausea. Patient has history of right second toe osteomyelitis.  Patient is s/p of right second toe amputation on 06/07/2021.  Patient was initially treated with doxycycline.  He states that he has not been feeling well in the past several days.  Patient has severe generalized weakness, poor appetite, decreased oral intake.  He has nausea and dry heaves, no diarrhea or abdominal pain.  No fever or chills.  Patient has mild dry cough, no chest pain or shortness of breath.  Denies symptoms of UTI. He states that he does not have worsening pain in in the surgical site.  No active drainage noted.  Patient was seen by his podiatrist yesterday, who thought that his wound looks okay.  Patient was started on Bactrim.Patient was initially hypotensive with blood pressure 82/40, which improved to 109/77 after giving 2 L normal saline bolus in the ED.  X-ray of right foot showed s/p of second toe amputation without evidence of osteomyelitis.   CT of abdomen/pelvis that showed mild wall thickening of duodenum, indicating possible duodenitis.    Once patient was treated and clinically improved he was supposed to go to SNF initially.  COVID testing for SNF came back positive.  Patient was placed on 10-day quarantine for rehab however today he declined going to SNF and wanted to go home.    Duodenitis Right lower extremity cellulitis Possible urinary tract  infection Severe sepsis secondary to above Criteria met with leukocytosis, tachycardia, tachypnea, elevated lactic acid Initial hypotension resolved with IV fluid resuscitation Suspect that duodenitis is primary driver for infection No evidence of osteomyelitis Podiatry was consulted Wound nursing was consulted No surgical intervention from podiatry standpoint  completed antibiotic course with ciprofloxacin and flagyl  Covid +  Currently asymptomatic Received IV Remdesivir IS Told patient he needs to continue quarantine for a full 10 days   Essential hypertension Continue home meds   Normocytic anemia Suspect anemia of chronic kidney disease Stable, monitor periodically F/u with pcp   Acute on CKD (chronic kidney disease), stage IIIa::  Renal function close to baseline.  Recent baseline creatinine 1.15-1.39 At baseline currently    Abnormal liver function Patient has some mild abnormal liver function, likely due to sepsis.   Elevated troponin:   likely due to demand ischemia. -Echocardiogram   Normal EF. No wall motion abnormality. Found with mild dilatation of aortic root and ascending aorta on echo Need to f/u with cards as outpt"  Diagnostic Tests Reviewed CLINICAL DATA:  Right toe amputation on 10/16 for osteo, weakness   EXAM: RIGHT FOOT COMPLETE - 3+ VIEW   COMPARISON:  None.   FINDINGS: No fracture or dislocation is seen.   Status post amputation of the 2nd digit at the level of the base of the proximal phalanx.   Mild soft tissue swelling.   The joint spaces are preserved.   IMPRESSION: Status post 2nd digit amputation.   No acute findings.  CLINICAL DATA:  Weakness   EXAM: PORTABLE CHEST 1 VIEW   COMPARISON:  None.   FINDINGS: Lungs are clear.  No pleural effusion or pneumothorax.   The heart is top-normal in size.   IMPRESSION: No evidence of acute cardiopulmonary disease.  CLINICAL DATA:  Abdominal pain, fever   EXAM: CT  ABDOMEN AND PELVIS WITH CONTRAST   TECHNIQUE: Multidetector CT imaging of the abdomen and pelvis was performed using the standard protocol following bolus administration of intravenous contrast.   CONTRAST:  150m OMNIPAQUE IOHEXOL 350 MG/ML SOLN   COMPARISON:  None.   FINDINGS: Lower chest: Mild bibasilar atelectasis.   Hepatobiliary: 2.8 cm cyst in segment 6.   Gallbladder is unremarkable. No intrahepatic or extrahepatic ductal dilatation.   Pancreas: Within normal limits.   Spleen: Within normal limits.   Adrenals/Urinary Tract: Adrenal glands are within normal limits.   Kidneys are within normal limits.  No hydronephrosis.   Bladder is within normal limits.   Stomach/Bowel: Stomach is within normal limits.   Mild wall thickening involving the proximal duodenum with suspected small duodenal diverticulum (coronal image 46).   No evidence of bowel obstruction.   Normal appendix (series 2/image 71).   No colonic wall thickening or inflammatory changes.   Vascular/Lymphatic: No evidence of abdominal aortic aneurysm.   Atherosclerotic calcifications of the abdominal aorta and branch vessels.   No suspicious abdominopelvic lymphadenopathy.   Reproductive: Prostate is unremarkable.  Other: No abdominopelvic ascites.   Musculoskeletal: Degenerative changes of the visualized thoracolumbar spine.   IMPRESSION: Mild wall thickening involving the proximal duodenum, raising the possibility of duodenitis. Adjacent small duodenal diverticulum.   No evidence of bowel obstruction.  Normal appendix.  CLINICAL DATA:  Swelling of the foot, diabetes. Recent (06/07/2021) amputation of the right second toe at the diaphyseal level of the proximal phalanx.   EXAM: MRI OF THE RIGHT FOREFOOT WITHOUT CONTRAST   TECHNIQUE: Multiplanar, multisequence MR imaging of the right forefoot was performed. No intravenous contrast was administered.   COMPARISON:  06/12/2021  radiographs and MRI from 06/07/2021   FINDINGS: Despite efforts by the technologist and patient, prominent motion artifact is present on today's exam and could not be eliminated. This reduces exam sensitivity and specificity.   Bones/Joint/Cartilage   Amputation of the second toe at the level of the mid diaphysis of the proximal phalanx. Trace fluid signal intensity just distal to the bony margin on image 19 series 10 but no findings of active osteomyelitis involving the remainder of the proximal phalanx.   Stable arthropathy between the middle cuneiform and the base of the second metatarsal, with associated marrow edema and erosion or subcortical cyst along the base of the second metatarsal on image 9 series 7.   We again demonstrate substantial erosions along the medial, lateral, and distal articular margins of the first metatarsal head best depicted on the T1 weighted images such as images 10 through 13 of series 7. There is associated marrow edema in the left first metatarsal head and distal shaft. Degenerative findings but no substantial degree of edema in the proximal phalanx of the great toe. Effusion of the first MTP joint is again noted particularly dorsally where there a septated and somewhat heterogeneous effusion on image 26 series 10 similar to prior. Mild sclerosis of the first digit medial sesamoid. Overall these findings are not changed from prior, with the erosions along the first metatarsal head potentially from gout/crystal arthropathy or septic joint.   Small erosion or degenerative subcortical cyst in the head of the fifth metatarsal, image 12 series 7, similar to prior. There is low-level marrow edema in the proximal diaphysis of the fifth metatarsal which is unchanged and probably from stress reaction.   Additional mild degenerative findings noted along the midfoot and Chopart joint.   Ligaments   The Lisfranc ligament appears intact.   Muscles and  Tendons   Flexor digitorum longus tenosynovitis and peritendinitis distally, mildly increased from the 06/07/2021 exam. Regional muscular atrophy with low-level accentuated T2 signal along the regional musculature which is probably neuropathic.   Soft tissues   Mild dorsal subcutaneous edema along the forefoot.   IMPRESSION: 1. Interval amputation of the second toe at the level of the mid diaphysis of the proximal phalanx, without evidence of osteomyelitis involving the remaining portion of the proximal phalanx. 2. Stable appearance of substantial erosive arthropathy at the first MTP joint especially involving the first metatarsal head, with associated marrow edema in the metatarsal head and distal shaft. Complex dorsal effusion. The appearance tends to favor gout arthropathy, but as before, infection is not readily excluded. No substantial change over the last 5 days. 3. Stable stress reaction in the fifth metatarsal, with low-level marrow edema. Small erosion or degenerative subcortical cyst in the head of the fifth metatarsal, stable. 4. Increased flexor digitorum longus tenosynovitis and peritendinitis. 5. Mild dorsal subcutaneous edema along the forefoot, cellulitis is not excluded.  Disposition: Home. Refused SNF  Consults: podiatry, cardiology  Discharge Instructions Recommendations for Outpatient Follow-up:  Follow up with PCP in 1 week Please obtain BMP/CBC in one week Please follow up podiatry in one week  Disease/illness Education: Done today  Home Health/Community Services Discussions/Referrals: In place  Establishment or re-establishment of referral orders for community resources: In place  Discussion with other health care providers:  None  Assessment and Support of treatment regimen adherence: Good  Appointments Coordinated with: Patient and wife  Education for self-management, independent living, and ADLs:  Discussed today.  Since getting out of  the hospital, Erickson is not feeling well. Has been having diarrhea for about 3-4 days. Happening 3x a day after eating and waking him up in the middle of the night. Having a lot of diarrhea with every stool. Stools are now brown, no longer black. Has a bad smell. No pain. No fevers. He notes that he is still really tired from the [place], but is doing a lot better since getting out of the hospital.   Relevant past medical, surgical, family and social history reviewed and updated as indicated. Interim medical history since our last visit reviewed. Allergies and medications reviewed and updated.  Review of Systems  Constitutional:  Positive for fatigue. Negative for activity change, appetite change, chills, diaphoresis, fever and unexpected weight change.  HENT: Negative.    Respiratory: Negative.    Cardiovascular: Negative.   Gastrointestinal:  Positive for diarrhea. Negative for abdominal distention, abdominal pain, anal bleeding, blood in stool, constipation, nausea, rectal pain and vomiting.  Genitourinary: Negative.   Musculoskeletal: Negative.   Skin: Negative.   Neurological: Negative.   Psychiatric/Behavioral: Negative.     Per HPI unless specifically indicated above     Objective:    BP 108/70   Pulse 93   Temp 97.7 F (36.5 C)   Wt 235 lb 12.8 oz (107 kg)   SpO2 97%   BMI 28.70 kg/m   Wt Readings from Last 3 Encounters:  07/03/21 235 lb 12.8 oz (107 kg)  06/12/21 249 lb (112.9 kg)  06/11/21 249 lb 9.6 oz (113.2 kg)    Physical Exam Vitals and nursing note reviewed.  Constitutional:      General: He is not in acute distress.    Appearance: Normal appearance. He is not ill-appearing, toxic-appearing or diaphoretic.  HENT:     Head: Normocephalic and atraumatic.     Right Ear: External ear normal.     Left Ear: External ear normal.     Nose: Nose normal.     Mouth/Throat:     Mouth: Mucous membranes are moist.     Pharynx: Oropharynx is clear.  Eyes:     General:  No scleral icterus.       Right eye: No discharge.        Left eye: No discharge.     Extraocular Movements: Extraocular movements intact.     Conjunctiva/sclera: Conjunctivae normal.     Pupils: Pupils are equal, round, and reactive to light.  Cardiovascular:     Rate and Rhythm: Normal rate and regular rhythm.     Pulses: Normal pulses.     Heart sounds: Normal heart sounds. No murmur heard.   No friction rub. No gallop.  Pulmonary:     Effort: Pulmonary effort is normal. No respiratory distress.     Breath sounds: Normal breath sounds. No stridor. No wheezing, rhonchi or rales.  Chest:     Chest wall: No tenderness.  Musculoskeletal:  General: Normal range of motion.     Cervical back: Normal range of motion and neck supple.  Skin:    General: Skin is warm and dry.     Capillary Refill: Capillary refill takes less than 2 seconds.     Coloration: Skin is not jaundiced or pale.     Findings: No bruising, erythema, lesion or rash.  Neurological:     General: No focal deficit present.     Mental Status: He is alert and oriented to person, place, and time. Mental status is at baseline.  Psychiatric:        Mood and Affect: Mood normal.        Behavior: Behavior normal.        Thought Content: Thought content normal.        Judgment: Judgment normal.    Results for orders placed or performed in visit on 07/03/21  Fecal occult blood, imunochemical([facility])   Specimen: Stool   ST  Result Value Ref Range   Fecal Occult Bld Negative Negative  Fecal leukocytes   Specimen: Stool   ST     CD- [id number] V  Result Value Ref Range   White Blood Cells (WBC), Stool WILL FOLLOW   Stool Culture   Specimen: Stool   ST     CD- [id number] V  Result Value Ref Range   Salmonella/Shigella Screen Final report    Stool Culture result 1 ([facility]) Comment    Campylobacter Culture Preliminary report    Stool Culture result 1 ([facility]) Comment    E coli, Shiga toxin Assay Negative  Negative  Clostridium difficile EIA   ST     CD- [id number] V  Result Value Ref Range   C difficile Toxins A+B, EIA Positive (A) Negative  Ova and parasite examination   ST     CD- [id number] V  Result Value Ref Range   OVA + PARASITE EXAM WILL FOLLOW   Basic metabolic panel  Result Value Ref Range   Glucose 157 (H) 70 - 99 mg/dL   BUN 25 8 - 27 mg/dL   Creatinine, Ser 1.36 (H) 0.76 - 1.27 mg/dL   eGFR 56 (L) >59 mL/min/1.73   BUN/Creatinine Ratio 18 10 - 24   Sodium 135 134 - 144 mmol/L   Potassium 4.6 3.5 - 5.2 mmol/L   Chloride 101 96 - 106 mmol/L   CO2 20 20 - 29 mmol/L   Calcium 9.3 8.6 - 10.2 mg/dL  CBC with Differential/Platelet  Result Value Ref Range   WBC 7.1 3.4 - 10.8 x10E3/uL   RBC 3.53 (L) 4.14 - 5.80 x10E6/uL   Hemoglobin 10.5 (L) 13.0 - 17.7 g/dL   Hematocrit 30.5 (L) 37.5 - 51.0 %   MCV 86 79 - 97 fL   MCH 29.7 26.6 - 33.0 pg   MCHC 34.4 31.5 - 35.7 g/dL   RDW 14.0 11.6 - 15.4 %   Platelets 340 150 - 450 x10E3/uL   Neutrophils 66 Not Estab. %   Lymphs 15 Not Estab. %   Monocytes 11 Not Estab. %   Eos 4 Not Estab. %   Basos 1 Not Estab. %   Neutrophils Absolute 4.8 1.4 - 7.0 x10E3/uL   Lymphocytes Absolute 1.1 0.7 - 3.1 x10E3/uL   Monocytes Absolute 0.8 0.1 - 0.9 x10E3/uL   EOS (ABSOLUTE) 0.3 0.0 - 0.4 x10E3/uL   Basophils Absolute 0.1 0.0 - 0.2 x10E3/uL   Immature Granulocytes 3 Not Estab. %   Immature Grans (Abs) 0.2 (H) 0.0 -  0.1 x10E3/uL      Assessment & Plan:   Problem List Items Addressed This Visit       Cardiovascular and Mediastinum   Acquired dilation of ascending aorta and aortic root ([place])    Needs follow up with cardiology. Referral generated today. Will keep BP, sugars and cholesterol under good control. Continue to monitor. Call with any concerns.       Relevant Orders   Ambulatory referral to Cardiology     Digestive   Duodenitis    Had resolved, but now diarrhea restarted- concern for c. Diff. Stool studies pending. Continue  to monitor.         Endocrine   DM type 2 with diabetic peripheral neuropathy ([place])    Doing well with a1c of 6.0. Continue diet and exercise. Call with any concerns. Continue to monitor.         Other   Status post amputation of toe of right foot (Daniel)   Other Visit Diagnoses     Diarrhea, unspecified type    -  Primary   Significant concern for c diff. Will start him on flagyl and await stool studies. Infection prevention discussed with patient and wife.    Relevant Orders   Basic metabolic panel (Completed)   CBC with Differential/Platelet (Completed)   Abnormal liver function       Will recheck labs next visit. Await results.    COVID       Feeling well. Out of quarantine. Continue to monitor.    Relevant Medications   metroNIDAZOLE (FLAGYL) 500 MG tablet        Follow up plan: Return in about 1 week (around 07/10/2021).  >2mnutes with patient and wife today.

## 2021-07-04 LAB — CBC WITH DIFFERENTIAL/PLATELET
Basophils Absolute: 0.1 10*3/uL (ref 0.0–0.2)
Basos: 1 %
EOS (ABSOLUTE): 0.3 10*3/uL (ref 0.0–0.4)
Eos: 4 %
Hematocrit: 30.5 % — ABNORMAL LOW (ref 37.5–51.0)
Hemoglobin: 10.5 g/dL — ABNORMAL LOW (ref 13.0–17.7)
Immature Grans (Abs): 0.2 10*3/uL — ABNORMAL HIGH (ref 0.0–0.1)
Immature Granulocytes: 3 %
Lymphocytes Absolute: 1.1 10*3/uL (ref 0.7–3.1)
Lymphs: 15 %
MCH: 29.7 pg (ref 26.6–33.0)
MCHC: 34.4 g/dL (ref 31.5–35.7)
MCV: 86 fL (ref 79–97)
Monocytes Absolute: 0.8 10*3/uL (ref 0.1–0.9)
Monocytes: 11 %
Neutrophils Absolute: 4.8 10*3/uL (ref 1.4–7.0)
Neutrophils: 66 %
Platelets: 340 10*3/uL (ref 150–450)
RBC: 3.53 x10E6/uL — ABNORMAL LOW (ref 4.14–5.80)
RDW: 14 % (ref 11.6–15.4)
WBC: 7.1 10*3/uL (ref 3.4–10.8)

## 2021-07-04 LAB — BASIC METABOLIC PANEL
BUN/Creatinine Ratio: 18 (ref 10–24)
BUN: 25 mg/dL (ref 8–27)
CO2: 20 mmol/L (ref 20–29)
Calcium: 9.3 mg/dL (ref 8.6–10.2)
Chloride: 101 mmol/L (ref 96–106)
Creatinine, Ser: 1.36 mg/dL — ABNORMAL HIGH (ref 0.76–1.27)
Glucose: 157 mg/dL — ABNORMAL HIGH (ref 70–99)
Potassium: 4.6 mmol/L (ref 3.5–5.2)
Sodium: 135 mmol/L (ref 134–144)
eGFR: 56 mL/min/{1.73_m2} — ABNORMAL LOW (ref >59)

## 2021-07-05 LAB — FECAL OCCULT BLOOD, IMMUNOCHEMICAL: Fecal Occult Bld: NEGATIVE

## 2021-07-06 ENCOUNTER — Other Ambulatory Visit: Payer: Self-pay | Admitting: Family Medicine

## 2021-07-06 DIAGNOSIS — Z89421 Acquired absence of other right toe(s): Secondary | ICD-10-CM | POA: Insufficient documentation

## 2021-07-06 DIAGNOSIS — I77819 Aortic ectasia, unspecified site: Secondary | ICD-10-CM | POA: Insufficient documentation

## 2021-07-06 DIAGNOSIS — A0472 Enterocolitis due to Clostridium difficile, not specified as recurrent: Secondary | ICD-10-CM

## 2021-07-06 NOTE — Assessment & Plan Note (Signed)
Needs follow up with cardiology. Referral generated today. Will keep BP, sugars and cholesterol under good control. Continue to monitor. Call with any concerns.

## 2021-07-06 NOTE — Assessment & Plan Note (Signed)
Had resolved, but now diarrhea restarted- concern for c. Diff. Stool studies pending. Continue to monitor.

## 2021-07-06 NOTE — Assessment & Plan Note (Signed)
Doing well with a1c of 6.0. Continue diet and exercise. Call with any concerns. Continue to monitor.  ?

## 2021-07-07 LAB — STOOL CULTURE: E coli, Shiga toxin Assay: NEGATIVE

## 2021-07-07 LAB — FECAL LEUKOCYTES

## 2021-07-07 LAB — CLOSTRIDIUM DIFFICILE EIA: C difficile Toxins A+B, EIA: POSITIVE — AB

## 2021-07-07 LAB — OVA AND PARASITE EXAMINATION

## 2021-07-09 ENCOUNTER — Ambulatory Visit: Payer: Medicare PPO | Admitting: Family Medicine

## 2021-07-10 ENCOUNTER — Ambulatory Visit: Payer: Medicare PPO | Admitting: Family Medicine

## 2021-07-10 ENCOUNTER — Telehealth: Payer: Self-pay | Admitting: *Deleted

## 2021-07-10 ENCOUNTER — Other Ambulatory Visit: Payer: Self-pay

## 2021-07-10 ENCOUNTER — Telehealth: Payer: Self-pay | Admitting: Family Medicine

## 2021-07-10 ENCOUNTER — Encounter: Payer: Self-pay | Admitting: Family Medicine

## 2021-07-10 VITALS — BP 127/79 | HR 89 | Temp 98.0°F | Wt 233.0 lb

## 2021-07-10 DIAGNOSIS — A0472 Enterocolitis due to Clostridium difficile, not specified as recurrent: Secondary | ICD-10-CM

## 2021-07-10 MED ORDER — VANCOMYCIN HCL 125 MG PO CAPS: 125.0000 mg | ORAL_CAPSULE | Freq: Four times a day (QID) | ORAL | 0 refills | Status: DC

## 2021-07-10 NOTE — Progress Notes (Signed)
BP 127/79   Pulse 89   Temp 98 F (36.7 C)   Wt 233 lb (105.7 kg)   SpO2 97%   BMI 28.36 kg/m    Subjective:    Patient ID: Daniel Leon, male    DOB: 01-24-1951, 70 y.o.   MRN: [id number]  HPI: Daniel Leon is a 70 y.o. male  Chief Complaint  Patient presents with   Diarrhea    Patient states its about the same. Appetite has also decreased.    ABDOMINAL ISSUES Duration: 10+ days Nature: growling Frequency: intermittent Treatments attempted: antibiotics Constipation: no Diarrhea: yes Episodes of diarrhea/day: 3x a day Mucous in the stool: no Heartburn: no Bloating:yes Flatulence: yes Nausea: yes Vomiting: no Melena or hematochezia: no Rash: no Jaundice: no Fever: no Weight loss: yes Decreased appetite: no  Relevant past medical, surgical, family and social history reviewed and updated as indicated. Interim medical history since our last visit reviewed. Allergies and medications reviewed and updated.  Review of Systems  Constitutional: Negative.   Respiratory: Negative.    Cardiovascular: Negative.   Gastrointestinal:  Positive for diarrhea. Negative for abdominal distention, abdominal pain, anal bleeding, blood in stool, constipation, nausea, rectal pain and vomiting.  Musculoskeletal: Negative.   Skin:  Positive for pallor. Negative for color change, rash and wound.  Psychiatric/Behavioral: Negative.     Per HPI unless specifically indicated above     Objective:    BP 127/79   Pulse 89   Temp 98 F (36.7 C)   Wt 233 lb (105.7 kg)   SpO2 97%   BMI 28.36 kg/m   Wt Readings from Last 3 Encounters:  07/10/21 233 lb (105.7 kg)  07/03/21 235 lb 12.8 oz (107 kg)  06/12/21 249 lb (112.9 kg)    Physical Exam Vitals and nursing note reviewed.  Constitutional:      General: He is not in acute distress.    Appearance: Normal appearance. He is not ill-appearing, toxic-appearing or diaphoretic.  HENT:     Head: Normocephalic and  atraumatic.     Right Ear: External ear normal.     Left Ear: External ear normal.     Nose: Nose normal.     Mouth/Throat:     Mouth: Mucous membranes are moist.     Pharynx: Oropharynx is clear.  Eyes:     General: No scleral icterus.       Right eye: No discharge.        Left eye: No discharge.     Extraocular Movements: Extraocular movements intact.     Conjunctiva/sclera: Conjunctivae normal.     Pupils: Pupils are equal, round, and reactive to light.  Cardiovascular:     Rate and Rhythm: Normal rate and regular rhythm.     Pulses: Normal pulses.     Heart sounds: Normal heart sounds. No murmur heard.   No friction rub. No gallop.  Pulmonary:     Effort: Pulmonary effort is normal. No respiratory distress.     Breath sounds: Normal breath sounds. No stridor. No wheezing, rhonchi or rales.  Chest:     Chest wall: No tenderness.  Musculoskeletal:        General: Normal range of motion.     Cervical back: Normal range of motion and neck supple.  Skin:    General: Skin is warm and dry.     Capillary Refill: Capillary refill takes less than 2 seconds.     Coloration: Skin is not jaundiced  or pale.     Findings: No bruising, erythema, lesion or rash.  Neurological:     General: No focal deficit present.     Mental Status: He is alert and oriented to person, place, and time. Mental status is at baseline.  Psychiatric:        Mood and Affect: Mood normal.        Behavior: Behavior normal.        Thought Content: Thought content normal.        Judgment: Judgment normal.    Results for orders placed or performed in visit on 07/03/21  Fecal occult blood, imunochemical([facility])   Specimen: Stool   ST  Result Value Ref Range   Fecal Occult Bld Negative Negative  Fecal leukocytes   Specimen: Stool   ST     CD- [id number] V  Result Value Ref Range   White Blood Cells (WBC), Stool Final report None Seen   Result 1 Comment   Stool Culture   Specimen: Stool   ST      CD- [id number] V  Result Value Ref Range   Salmonella/Shigella Screen Final report    Stool Culture result 1 ([facility]AS[facility]) Comment    Campylobacter Culture Final report    Stool Culture result 1 (CMPCXR) Comment    E coli, Shiga toxin Assay Negative Negative  Clostridium difficile EIA   ST     CD- [id number] V  Result Value Ref Range   C difficile Toxins A+B, EIA Positive (A) Negative  Ova and parasite examination   ST     CD- [id number] V  Result Value Ref Range   OVA + PARASITE EXAM Final report    O&P result 1 Comment   Basic metabolic panel  Result Value Ref Range   Glucose 157 (H) 70 - 99 mg/dL   BUN 25 8 - 27 mg/dL   Creatinine, Ser 1.36 (H) 0.76 - 1.27 mg/dL   eGFR 56 (L) >59 mL/min/1.73   BUN/Creatinine Ratio 18 10 - 24   Sodium 135 134 - 144 mmol/L   Potassium 4.6 3.5 - 5.2 mmol/L   Chloride 101 96 - 106 mmol/L   CO2 20 20 - 29 mmol/L   Calcium 9.3 8.6 - 10.2 mg/dL  CBC with Differential/Platelet  Result Value Ref Range   WBC 7.1 3.4 - 10.8 x10E3/uL   RBC 3.53 (L) 4.14 - 5.80 x10E6/uL   Hemoglobin 10.5 (L) 13.0 - 17.7 g/dL   Hematocrit 30.5 (L) 37.5 - 51.0 %   MCV 86 79 - 97 fL   MCH 29.7 26.6 - 33.0 pg   MCHC 34.4 31.5 - 35.7 g/dL   RDW 14.0 11.6 - 15.4 %   Platelets 340 150 - 450 x10E3/uL   Neutrophils 66 Not Estab. %   Lymphs 15 Not Estab. %   Monocytes 11 Not Estab. %   Eos 4 Not Estab. %   Basos 1 Not Estab. %   Neutrophils Absolute 4.8 1.4 - 7.0 x10E3/uL   Lymphocytes Absolute 1.1 0.7 - 3.1 x10E3/uL   Monocytes Absolute 0.8 0.1 - 0.9 x10E3/uL   EOS (ABSOLUTE) 0.3 0.0 - 0.4 x10E3/uL   Basophils Absolute 0.1 0.0 - 0.2 x10E3/uL   Immature Granulocytes 3 Not Estab. %   Immature Grans (Abs) 0.2 (H) 0.0 - 0.1 x10E3/uL      Assessment & Plan:   Problem List Items Addressed This Visit   None Visit Diagnoses     C. difficile diarrhea    -  Primary  Has failed flagyl. Will treat with PO Vanco. Checking labs and rechecking stool. Follow up 2 weeks.    Relevant  Medications   vancomycin (VANCOCIN) 125 MG capsule   Other Relevant Orders   Comprehensive metabolic panel   CBC with Differential/Platelet   Stool C-Diff Toxin Assay        Follow up plan: Return in about 2 weeks (around 07/24/2021).

## 2021-07-10 NOTE — Telephone Encounter (Signed)
See previous TE regarding Vancomycin.

## 2021-07-10 NOTE — Telephone Encounter (Signed)
Copied from CRM [id number]. Topic: General - Other >> Jul 10, 2021  4:48 PM Gaetana Michaelis A wrote: Reason for CRM: The patient has called to share that [facility] will not be able to fill their prescription for vancomycin (VANCOCIN) 125 MG capsule [[id number]]   The patient would like the medication called in to a pharmacy that is able to fill the prescription   Please contact further when possible

## 2021-07-10 NOTE — Telephone Encounter (Signed)
Call from patient requesting vancomycin order by Dr. Laural Benes be called in to [facility]. Patient line disconnected.   Outbound call to [facility]. Verbal order provided to pharmacist as written by Dr. Laural Benes today, 07/10/21.

## 2021-07-10 NOTE — Telephone Encounter (Signed)
Patient called in asking for vancomycin (VANCOCIN) 125 MG capsule, to be sent to [facility] , [facility] [address] [place] [address] [phone]

## 2021-07-11 LAB — CBC WITH DIFFERENTIAL/PLATELET
Basophils Absolute: 0.1 10*3/uL (ref 0.0–0.2)
Basos: 1 %
EOS (ABSOLUTE): 0.2 10*3/uL (ref 0.0–0.4)
Eos: 2 %
Hematocrit: 33.4 % — ABNORMAL LOW (ref 37.5–51.0)
Hemoglobin: 11.3 g/dL — ABNORMAL LOW (ref 13.0–17.7)
Immature Grans (Abs): 0.2 10*3/uL — ABNORMAL HIGH (ref 0.0–0.1)
Immature Granulocytes: 3 %
Lymphocytes Absolute: 1.7 10*3/uL (ref 0.7–3.1)
Lymphs: 19 %
MCH: 29 pg (ref 26.6–33.0)
MCHC: 33.8 g/dL (ref 31.5–35.7)
MCV: 86 fL (ref 79–97)
Monocytes Absolute: 1 10*3/uL — ABNORMAL HIGH (ref 0.1–0.9)
Monocytes: 11 %
Neutrophils Absolute: 5.8 10*3/uL (ref 1.4–7.0)
Neutrophils: 64 %
Platelets: 394 10*3/uL (ref 150–450)
RBC: 3.89 x10E6/uL — ABNORMAL LOW (ref 4.14–5.80)
RDW: 14.2 % (ref 11.6–15.4)
WBC: 9 10*3/uL (ref 3.4–10.8)

## 2021-07-11 LAB — COMPREHENSIVE METABOLIC PANEL
ALT: 18 IU/L (ref 0–44)
AST: 23 IU/L (ref 0–40)
Albumin/Globulin Ratio: 0.9 — ABNORMAL LOW (ref 1.2–2.2)
Albumin: 3.8 g/dL (ref 3.7–4.7)
Alkaline Phosphatase: 73 IU/L (ref 44–121)
BUN/Creatinine Ratio: 16 (ref 10–24)
BUN: 19 mg/dL (ref 8–27)
Bilirubin Total: 0.4 mg/dL (ref 0.0–1.2)
CO2: 21 mmol/L (ref 20–29)
Calcium: 9.5 mg/dL (ref 8.6–10.2)
Chloride: 97 mmol/L (ref 96–106)
Creatinine, Ser: 1.22 mg/dL (ref 0.76–1.27)
Globulin, Total: 4.2 g/dL (ref 1.5–4.5)
Glucose: 142 mg/dL — ABNORMAL HIGH (ref 70–99)
Potassium: 4.6 mmol/L (ref 3.5–5.2)
Sodium: 134 mmol/L (ref 134–144)
Total Protein: 8 g/dL (ref 6.0–8.5)
eGFR: 63 mL/min/{1.73_m2} (ref >59)

## 2021-07-13 MED ORDER — VANCOMYCIN HCL 125 MG PO CAPS: 125.0000 mg | ORAL_CAPSULE | Freq: Four times a day (QID) | ORAL | 0 refills | Status: AC

## 2021-07-14 DIAGNOSIS — A0472 Enterocolitis due to Clostridium difficile, not specified as recurrent: Secondary | ICD-10-CM | POA: Diagnosis not present

## 2021-07-17 LAB — CLOSTRIDIUM DIFFICILE EIA: C difficile Toxins A+B, EIA: NEGATIVE

## 2021-07-19 ENCOUNTER — Other Ambulatory Visit: Payer: Self-pay | Admitting: Family Medicine

## 2021-07-20 ENCOUNTER — Encounter: Payer: Self-pay | Admitting: Family Medicine

## 2021-07-20 NOTE — Telephone Encounter (Signed)
Requested Prescriptions  Pending Prescriptions Disp Refills  . gabapentin (NEURONTIN) 400 MG capsule [Pharmacy Med Name: GABAPENTIN 400MG  CAPSULES] 270 capsule     Sig: TAKE ONE CAPSULE BY MOUTH TWICE DAILY. MAY TAKE A THIRD CAPSULE AS NEEDED FOR PAIN     Neurology: Anticonvulsants - gabapentin Passed - 07/19/2021 12:20 PM      Passed - Valid encounter within last 12 months    Recent Outpatient Visits          1 week ago C. difficile diarrhea   [facility] [place], Megan P, DO   2 weeks ago Diarrhea, unspecified type   [facility] [place], Megan P, DO   1 month ago Nausea   [facility] [place], Avanti, MD   6 months ago Routine general medical examination at a health care facility   [facility], Megan P, DO   1 year ago DM type 2 with diabetic peripheral neuropathy [facility])   [facility] [place], SAN REMO, DO      Future Appointments            In 1 week Oralia Rud, Laural Benes, DO [facility], [facility]   In 1 week Oralia Rud, MD [facility]   In 2 months  [facility], [facility]

## 2021-07-27 ENCOUNTER — Ambulatory Visit: Payer: Medicare PPO | Admitting: Family Medicine

## 2021-07-27 ENCOUNTER — Telehealth: Payer: Self-pay

## 2021-07-27 ENCOUNTER — Encounter: Payer: Self-pay | Admitting: Family Medicine

## 2021-07-27 ENCOUNTER — Other Ambulatory Visit: Payer: Self-pay

## 2021-07-27 VITALS — BP 104/67 | HR 88 | Temp 98.3°F | Wt 235.8 lb

## 2021-07-27 DIAGNOSIS — Z23 Encounter for immunization: Secondary | ICD-10-CM | POA: Diagnosis not present

## 2021-07-27 DIAGNOSIS — I10 Essential (primary) hypertension: Secondary | ICD-10-CM

## 2021-07-27 DIAGNOSIS — A0472 Enterocolitis due to Clostridium difficile, not specified as recurrent: Secondary | ICD-10-CM

## 2021-07-27 MED ORDER — LISINOPRIL 10 MG PO TABS: 15.0000 mg | ORAL_TABLET | Freq: Every day | ORAL | 1 refills | Status: AC

## 2021-07-27 MED ORDER — GABAPENTIN 400 MG PO CAPS: ORAL_CAPSULE | ORAL | 1 refills | Status: AC

## 2021-07-27 NOTE — Assessment & Plan Note (Signed)
Under good control on current regimen. Continue current regimen. Continue to monitor. Call with any concerns. Refills given. Labs drawn today.   

## 2021-07-27 NOTE — Telephone Encounter (Signed)
Patient Diabetic Eye Exam was requested at today's visit via Epic.

## 2021-07-27 NOTE — Telephone Encounter (Signed)
-----   Message from Dorcas Carrow, DO sent at 07/27/2021  2:58 PM EST ----- Please check with [facility] in [place] if he had an eye exam this year.

## 2021-07-27 NOTE — Progress Notes (Signed)
BP 104/67   Pulse 88   Temp 98.3 F (36.8 C)   Wt 235 lb 12.8 oz (107 kg)   SpO2 97%   BMI 28.70 kg/m    Subjective:    Patient ID: Daniel Leon, male    DOB: [birth year], 70 y.o.   MRN: [id number]  HPI: Daniel Leon is a 70 y.o. male  Chief Complaint  Patient presents with   C. difficile diarrhea    Follow up, patient states he is feeling better   Diarrhea has resolved. Eating normally. Feeling like himself.  HYPERTENSION Hypertension status: controlled  Satisfied with current treatment? yes Duration of hypertension: chronic BP medication side effects:  no Medication compliance: excellent compliance Previous BP meds:lisinopril Aspirin: no Recurrent headaches: no Visual changes: no Palpitations: no Dyspnea: no Chest pain: no Lower extremity edema: no Dizzy/lightheaded: no  Relevant past medical, surgical, family and social history reviewed and updated as indicated. Interim medical history since our last visit reviewed. Allergies and medications reviewed and updated.  Review of Systems  Constitutional: Negative.   Respiratory: Negative.    Cardiovascular: Negative.   Gastrointestinal: Negative.   Neurological: Negative.   Psychiatric/Behavioral: Negative.     Per HPI unless specifically indicated above     Objective:    BP 104/67   Pulse 88   Temp 98.3 F (36.8 C)   Wt 235 lb 12.8 oz (107 kg)   SpO2 97%   BMI 28.70 kg/m   Wt Readings from Last 3 Encounters:  07/27/21 235 lb 12.8 oz (107 kg)  07/10/21 233 lb (105.7 kg)  07/03/21 235 lb 12.8 oz (107 kg)    Physical Exam Vitals and nursing note reviewed.  Constitutional:      General: He is not in acute distress.    Appearance: Normal appearance. He is not ill-appearing, toxic-appearing or diaphoretic.  HENT:     Head: Normocephalic and atraumatic.     Right Ear: External ear normal.     Left Ear: External ear normal.     Nose: Nose normal.     Mouth/Throat:     Mouth: Mucous  membranes are moist.     Pharynx: Oropharynx is clear.  Eyes:     General: No scleral icterus.       Right eye: No discharge.        Left eye: No discharge.     Extraocular Movements: Extraocular movements intact.     Conjunctiva/sclera: Conjunctivae normal.     Pupils: Pupils are equal, round, and reactive to light.  Cardiovascular:     Rate and Rhythm: Normal rate and regular rhythm.     Pulses: Normal pulses.     Heart sounds: Normal heart sounds. No murmur heard.   No friction rub. No gallop.  Pulmonary:     Effort: Pulmonary effort is normal. No respiratory distress.     Breath sounds: Normal breath sounds. No stridor. No wheezing, rhonchi or rales.  Chest:     Chest wall: No tenderness.  Musculoskeletal:        General: Normal range of motion.     Cervical back: Normal range of motion and neck supple.  Skin:    General: Skin is warm and dry.     Capillary Refill: Capillary refill takes less than 2 seconds.     Coloration: Skin is not jaundiced or pale.     Findings: No bruising, erythema, lesion or rash.  Neurological:     General: No focal  deficit present.     Mental Status: He is alert and oriented to person, place, and time. Mental status is at baseline.  Psychiatric:        Mood and Affect: Mood normal.        Behavior: Behavior normal.        Thought Content: Thought content normal.        Judgment: Judgment normal.    Results for orders placed or performed in visit on 07/10/21  Stool C-Diff Toxin Assay   Specimen: Stool   ST  Result Value Ref Range   C difficile Toxins A+B, EIA Negative Negative  Comprehensive metabolic panel  Result Value Ref Range   Glucose 142 (H) 70 - 99 mg/dL   BUN 19 8 - 27 mg/dL   Creatinine, Ser 1.22 0.76 - 1.27 mg/dL   eGFR 63 >59 mL/min/1.73   BUN/Creatinine Ratio 16 10 - 24   Sodium 134 134 - 144 mmol/L   Potassium 4.6 3.5 - 5.2 mmol/L   Chloride 97 96 - 106 mmol/L   CO2 21 20 - 29 mmol/L   Calcium 9.5 8.6 - 10.2 mg/dL    Total Protein 8.0 6.0 - 8.5 g/dL   Albumin 3.8 3.7 - 4.7 g/dL   Globulin, Total 4.2 1.5 - 4.5 g/dL   Albumin/Globulin Ratio 0.9 (L) 1.2 - 2.2   Bilirubin Total 0.4 0.0 - 1.2 mg/dL   Alkaline Phosphatase 73 44 - 121 IU/L   AST 23 0 - 40 IU/L   ALT 18 0 - 44 IU/L  CBC with Differential/Platelet  Result Value Ref Range   WBC 9.0 3.4 - 10.8 x10E3/uL   RBC 3.89 (L) 4.14 - 5.80 x10E6/uL   Hemoglobin 11.3 (L) 13.0 - 17.7 g/dL   Hematocrit 33.4 (L) 37.5 - 51.0 %   MCV 86 79 - 97 fL   MCH 29.0 26.6 - 33.0 pg   MCHC 33.8 31.5 - 35.7 g/dL   RDW 14.2 11.6 - 15.4 %   Platelets 394 150 - 450 x10E3/uL   Neutrophils 64 Not Estab. %   Lymphs 19 Not Estab. %   Monocytes 11 Not Estab. %   Eos 2 Not Estab. %   Basos 1 Not Estab. %   Neutrophils Absolute 5.8 1.4 - 7.0 x10E3/uL   Lymphocytes Absolute 1.7 0.7 - 3.1 x10E3/uL   Monocytes Absolute 1.0 (H) 0.1 - 0.9 x10E3/uL   EOS (ABSOLUTE) 0.2 0.0 - 0.4 x10E3/uL   Basophils Absolute 0.1 0.0 - 0.2 x10E3/uL   Immature Granulocytes 3 Not Estab. %   Immature Grans (Abs) 0.2 (H) 0.0 - 0.1 x10E3/uL      Assessment & Plan:   Problem List Items Addressed This Visit       Cardiovascular and Mediastinum   Essential hypertension - Primary    Under good control on current regimen. Continue current regimen. Continue to monitor. Call with any concerns. Refills given. Labs drawn today.       Relevant Medications   lisinopril (ZESTRIL) 10 MG tablet   Other Visit Diagnoses     C. difficile diarrhea       Resolved. Continue to monitor. Call with any concerns.    Needs flu shot       Relevant Orders   Flu Vaccine QUAD High Dose(Fluad) (Completed)        Follow up plan: Return in about 6 months (around 01/25/2022), or physical.

## 2021-07-31 ENCOUNTER — Other Ambulatory Visit: Payer: Self-pay

## 2021-07-31 ENCOUNTER — Ambulatory Visit: Payer: Medicare PPO | Admitting: Cardiology

## 2021-07-31 ENCOUNTER — Encounter: Payer: Self-pay | Admitting: Cardiology

## 2021-07-31 VITALS — BP 124/68 | HR 88 | Ht 76.0 in | Wt 236.2 lb

## 2021-07-31 DIAGNOSIS — I77810 Thoracic aortic ectasia: Secondary | ICD-10-CM | POA: Diagnosis not present

## 2021-07-31 DIAGNOSIS — I10 Essential (primary) hypertension: Secondary | ICD-10-CM

## 2021-07-31 NOTE — Patient Instructions (Addendum)
Medication Instructions:  Your physician recommends that you continue on your current medications as directed. Please refer to the Current Medication list given to you today.  *If you need a refill on your cardiac medications before your next appointment, please call your pharmacy*   Lab Work: None ordered If you have labs (blood work) drawn today and your tests are completely normal, you will receive your results only by: MyChart Message (if you have MyChart) OR A paper copy in the mail If you have any lab test that is abnormal or we need to change your treatment, we will call you to review the results.   Testing/Procedures:  Your physician has requested that you have an echocardiogram in 1 year. Echocardiography is a painless test that uses sound waves to create images of your heart. It provides your doctor with information about the size and shape of your heart and how well your heart's chambers and valves are working. This procedure takes approximately one hour. There are no restrictions for this procedure.    Follow-Up: At [facility], you and your health needs are our priority.  As part of our continuing mission to provide you with exceptional heart care, we have created designated Provider Care Teams.  These Care Teams include your primary Cardiologist (physician) and Advanced Practice Providers (APPs -  Physician Assistants and Nurse Practitioners) who all work together to provide you with the care you need, when you need it.  We recommend signing up for the patient portal called "MyChart".  Sign up information is provided on this After Visit Summary.  MyChart is used to connect with patients for Virtual Visits (Telemedicine).  Patients are able to view lab/test results, encounter notes, upcoming appointments, etc.  Non-urgent messages can be sent to your provider as well.   To learn more about what you can do with MyChart, go to [email or web address].    Your next appointment:    1 year(s)  The format for your next appointment:   In Person  Provider:   You may see Debbe Odea or one of the following Advanced Practice Providers on your designated Care Team:   Nicolasa Ducking, NP Eula Listen, PA-C Cadence Fransico [place], [place]    Other Instructions

## 2021-07-31 NOTE — Progress Notes (Signed)
Cardiology Office Note:    Date:  07/31/2021   ID:  Daniel Leon, DOB Jul 15, 1951, MRN [id number]  PCP:  Dorcas Carrow, DO   [facility] Providers Cardiologist:  None     Referring MD: Dorcas Carrow, DO   No chief complaint on file. Dilated aortic root.  History of Present Illness:    Daniel Leon is a 70 y.o. male with a hx of hypertension, diabetes, s/p left BKA 2020 who presents due to aortic root dilatation.  Patient had an echocardiogram performed due to an EKG abnormality/right bundle branch block.  Echo performed 06/11/2021 showed normal EF 60 to 65%, mild dilatation of the aortic root and ascending aorta measuring 40 mm.  He denies chest pain, shortness of breath.  Takes his BP medications as prescribed.  Has a history of diabetic neuropathy, Charcot's foot, underwent left BKA.   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    Charcot ankle, left    collapse   Charcot's joint of foot    Diabetes mellitus without complication (HCC)    DIET-CONTROLLED   Early cataracts, bilateral    Essential hypertension    Right bundle branch block (RBBB)    Traumatic arthropathy of ankle and foot    Wears glasses     Past Surgical History:  Procedure Laterality Date   AMPUTATION Left 10/13/2018   Procedure: LEFT BELOW KNEE AMPUTATION;  Surgeon: [name], MD;  Location: [facility] OR;  Service: Orthopedics;  Laterality: Left;   AMPUTATION TOE Right 06/07/2021   Procedure: AMPUTATION TOE;  Surgeon: Candelaria Stagers, DPM;  Location: [facility] ORS;  Service: Podiatry;  Laterality: Right;   COLONOSCOPY     ELBOW SURGERY Right 1966   EYE SURGERY  03/2019   cataracts- [place]    EYE SURGERY  02/2019   cataracts - [place]   FOOT ARTHRODESIS, SUBTALAR Right 2019   TONSILLECTOMY     TOTAL ANKLE REPLACEMENT     ULNAR NERVE TRANSPOSITION Right 09/05/2015   Procedure: ULNAR NERVE DECOMPRESSION/TRANSPOSITION;  Surgeon:  [name], MD;  Location: [facility] ORS;  Service: Orthopedics;  Laterality: Right;    Current Medications: Current Meds  Medication Sig   Cholecalciferol (VITAMIN D3) 125 MCG (5000 UT) CAPS Take 5,000 Units by mouth daily.   diclofenac sodium (VOLTAREN) 1 % GEL Apply 1 application topically 4 (four) times daily as needed (pain).   gabapentin (NEURONTIN) 400 MG capsule TAKE ONE CAPSULE BY MOUTH TWICE DAILY. MAY TAKE A THIRD CAPSULE AS NEEDED FOR PAIN   lisinopril (ZESTRIL) 10 MG tablet Take 1.5 tablets (15 mg total) by mouth daily.   Multiple Vitamin (MULTIVITAMIN) tablet Take 1 tablet by mouth daily.     Allergies:   Oxycodone   Social History   Socioeconomic History   Marital status: Married    Spouse name: Not on file   Number of children: Not on file   Years of education: college    Highest education level: Not on file  Occupational History   Not on file  Tobacco Use   Smoking status: Former    Packs/day: 0.50    Years: 10.00    Pack years: 5.00    Types: Cigarettes    Quit date: 07/27/2000    Years since quitting: 21.0   Smokeless tobacco: Never  Vaping Use   Vaping Use: Never used  Substance and Sexual Activity   Alcohol use: Yes  Comment: very little   Drug use: No   Sexual activity: Not Currently  Other Topics Concern   Not on file  Social History Narrative   Not on file   Social Determinants of Health   Financial Resource Strain: Low Risk    Difficulty of Paying Living Expenses: Not hard at all  Food Insecurity: No Food Insecurity   Worried About Programme researcher, broadcasting/film/video in the Last Year: Never true   Ran Out of Food in the Last Year: Never true  Transportation Needs: No Transportation Needs   Lack of Transportation (Medical): No   Lack of Transportation (Non-Medical): No  Physical Activity: Sufficiently Active   Days of Exercise per Week: 5 days   Minutes of Exercise per Session: 30 min  Stress: No Stress Concern Present   Feeling of Stress : Not  at all  Social Connections: Not on file     Family History: The patient's family history includes Heart disease in his father and mother; Hypertension in his brother, father, and mother. There is no history of Prostate cancer, Bladder Cancer, or Kidney cancer.  ROS:   Please see the history of present illness.     All other systems reviewed and are negative.  EKGs/Labs/Other Studies Reviewed:    The following studies were reviewed today:   EKG:  EKG is  ordered today.  The ekg ordered today demonstrates sinus rhythm, right bundle branch block.  Recent Labs: 01/08/2021: TSH 1.210 06/12/2021: B Natriuretic Peptide 126.6 07/10/2021: ALT 18; BUN 19; Creatinine, Ser 1.22; Hemoglobin 11.3; Platelets 394; Potassium 4.6; Sodium 134  Recent Lipid Panel    Component Value Date/Time   CHOL 128 06/13/2021 0503   CHOL 189 01/08/2021 1433   TRIG 108 06/13/2021 0503   HDL 22 (L) 06/13/2021 0503   HDL 35 (L) 01/08/2021 1433   CHOLHDL 5.8 06/13/2021 0503   VLDL 22 06/13/2021 0503   LDLCALC 84 06/13/2021 0503   LDLCALC 97 01/08/2021 1433     Risk Assessment/Calculations:         Physical Exam:    VS:  BP 124/68 (BP Location: Right Arm)   Pulse 88   Ht 6\' 4"  (1.93 m)   Wt 236 lb 3.2 oz (107.1 kg)   SpO2 97%   BMI 28.75 kg/m     Wt Readings from Last 3 Encounters:  07/31/21 236 lb 3.2 oz (107.1 kg)  07/27/21 235 lb 12.8 oz (107 kg)  07/10/21 233 lb (105.7 kg)     GEN:  Well nourished, well developed in no acute distress HEENT: Normal NECK: No JVD; No carotid bruits LYMPHATICS: No lymphadenopathy CARDIAC: RRR, no murmurs, rubs, gallops RESPIRATORY:  Clear to auscultation without rales, wheezing or rhonchi  ABDOMEN: Soft, non-tender, non-distended MUSCULOSKELETAL:  No edema; No deformity  SKIN: Warm and dry NEUROLOGIC:  Alert and oriented x 3 PSYCHIATRIC:  Normal affect   ASSESSMENT:    1. Aortic root dilatation (HCC)   2. Primary hypertension    PLAN:    In order  of problems listed above:  Mild aortic root and ascending aorta dilatation.  Measures 40 mm in diameter.  Repeat echo in 1 year to evaluate aortic root size and growth if any.  If stays stable, will consider reducing frequency of surveillance to every 2 to 3 years. Hypertension, BP controlled.  Continue lisinopril 15 mg daily.  Follow-up in 1 year after repeat echo      Medication Adjustments/Labs and Tests Ordered: Current medicines  are reviewed at length with the patient today.  Concerns regarding medicines are outlined above.  Orders Placed This Encounter  Procedures   EKG 12-Lead   ECHOCARDIOGRAM COMPLETE    No orders of the defined types were placed in this encounter.   Patient Instructions  Medication Instructions:  Your physician recommends that you continue on your current medications as directed. Please refer to the Current Medication list given to you today.  *If you need a refill on your cardiac medications before your next appointment, please call your pharmacy*   Lab Work: None ordered If you have labs (blood work) drawn today and your tests are completely normal, you will receive your results only by: MyChart Message (if you have MyChart) OR A paper copy in the mail If you have any lab test that is abnormal or we need to change your treatment, we will call you to review the results.   Testing/Procedures:  Your physician has requested that you have an echocardiogram in 1 year. Echocardiography is a painless test that uses sound waves to create images of your heart. It provides your doctor with information about the size and shape of your heart and how well your heart's chambers and valves are working. This procedure takes approximately one hour. There are no restrictions for this procedure.    Follow-Up: At [facility], you and your health needs are our priority.  As part of our continuing mission to provide you with exceptional heart care, we have created  designated Provider Care Teams.  These Care Teams include your primary Cardiologist (physician) and Advanced Practice Providers (APPs -  Physician Assistants and Nurse Practitioners) who all work together to provide you with the care you need, when you need it.  We recommend signing up for the patient portal called "MyChart".  Sign up information is provided on this After Visit Summary.  MyChart is used to connect with patients for Virtual Visits (Telemedicine).  Patients are able to view lab/test results, encounter notes, upcoming appointments, etc.  Non-urgent messages can be sent to your provider as well.   To learn more about what you can do with MyChart, go to [email or web address].    Your next appointment:   1 year(s)  The format for your next appointment:   In Person  Provider:   You may see Debbe Odea or one of the following Advanced Practice Providers on your designated Care Team:   Nicolasa Ducking, NP Eula Listen, PA-C Cadence Fransico [place], [place]    Other Instructions     Signed, Debbe Odea, MD  07/31/2021 3:46 PM    [facility]

## 2021-09-18 DIAGNOSIS — I10 Essential (primary) hypertension: Secondary | ICD-10-CM | POA: Diagnosis not present

## 2021-09-18 DIAGNOSIS — Z89512 Acquired absence of left leg below knee: Secondary | ICD-10-CM | POA: Diagnosis not present

## 2021-09-18 DIAGNOSIS — E6609 Other obesity due to excess calories: Secondary | ICD-10-CM | POA: Diagnosis not present

## 2021-09-18 DIAGNOSIS — G5621 Lesion of ulnar nerve, right upper limb: Secondary | ICD-10-CM | POA: Diagnosis not present

## 2021-09-18 DIAGNOSIS — Z6830 Body mass index (BMI) 30.0-30.9, adult: Secondary | ICD-10-CM | POA: Diagnosis not present

## 2021-09-25 ENCOUNTER — Ambulatory Visit: Payer: Medicare PPO

## 2021-10-08 ENCOUNTER — Other Ambulatory Visit: Payer: Self-pay

## 2021-10-08 ENCOUNTER — Encounter: Payer: Self-pay | Admitting: Podiatry

## 2021-10-08 ENCOUNTER — Ambulatory Visit (INDEPENDENT_AMBULATORY_CARE_PROVIDER_SITE_OTHER): Payer: Medicare PPO | Admitting: Podiatry

## 2021-10-08 DIAGNOSIS — Z89421 Acquired absence of other right toe(s): Secondary | ICD-10-CM

## 2021-10-08 DIAGNOSIS — M79674 Pain in right toe(s): Secondary | ICD-10-CM | POA: Diagnosis not present

## 2021-10-08 DIAGNOSIS — Z89612 Acquired absence of left leg above knee: Secondary | ICD-10-CM

## 2021-10-08 DIAGNOSIS — Z89619 Acquired absence of unspecified leg above knee: Secondary | ICD-10-CM | POA: Insufficient documentation

## 2021-10-08 DIAGNOSIS — B351 Tinea unguium: Secondary | ICD-10-CM

## 2021-10-08 NOTE — Progress Notes (Signed)
This patient returns to my office for at risk foot care.  This patient requires this care by a professional since this patient will be at risk due to having DPN and CKD. Patient has amputation left leg and second toe right foot.  This patient is unable to cut nails himself since the patient cannot reach his nails.These nails are painful walking and wearing shoes.  This patient presents for at risk foot care today.  General Appearance  Alert, conversant and in no acute stress.  Vascular  Dorsalis pedis and posterior tibial  pulses are palpable  right foot  Capillary return is within normal limits  right Temperature is within normal limits right foot..  Neurologic  Senn-Weinstein monofilament wire test diminished right  foot.Muscle power within normal limits right.  Nails Thick disfigured discolored nails with subungual debris  from hallux and 3-5 digits right foot. No evidence of bacterial infection or drainage bilaterally.  Orthopedic  No limitations of motion  feet .  No crepitus or effusions noted.  No bony pathology.  AK amputation left leg and amputation second digit right foot.  Skin  normotropic skin with no porokeratosis noted bilaterally.  No signs of infections or ulcers noted.     Onychomycosis  Pain in right toes    Consent was obtained for treatment procedures.   Mechanical debridement of nails 1-5  bilaterally performed with a nail nipper.  Filed with dremel without incident.    Return office visit     3 months                Told patient to return for periodic foot care and evaluation due to potential at risk complications.   Helane Gunther DPM

## 2021-11-02 IMAGING — CT CT ABD-PELV W/ CM
2 of 5 series · 16 of 46 positions shown, 18 images · IV contrast (omnipaque)
Comparison: None.

CLINICAL DATA: Abdominal pain, fever

EXAM:
CT ABDOMEN AND PELVIS WITH CONTRAST
TECHNIQUE: Multidetector CT imaging of the abdomen and pelvis was performed
using the standard protocol following bolus administration of
intravenous contrast.
CONTRAST:  100mL OMNIPAQUE IOHEXOL 350 MG/ML SOLN

[Series 2: routine abd/pel with · axial · 0.94mm/px · z∈[-1111,-616]mm · 13 of 113 slices shown, 15 images]
[im 7/113  soft-tissue]
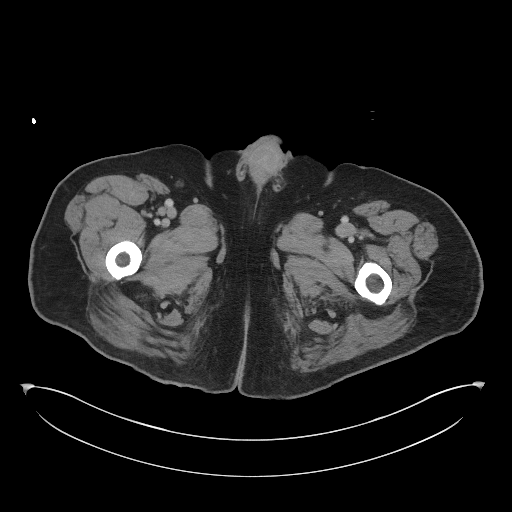
[im 7/113  bone]
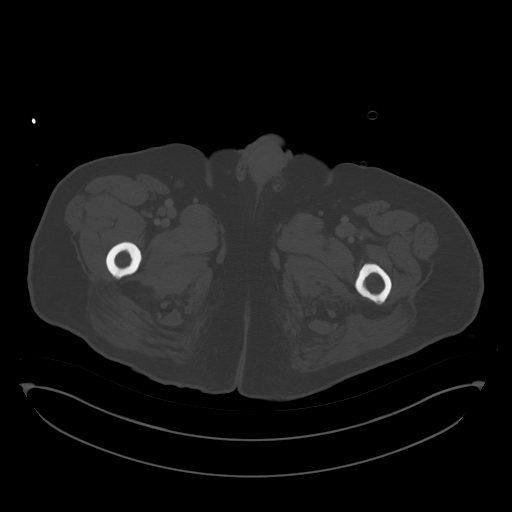
[im 13/113  soft-tissue]
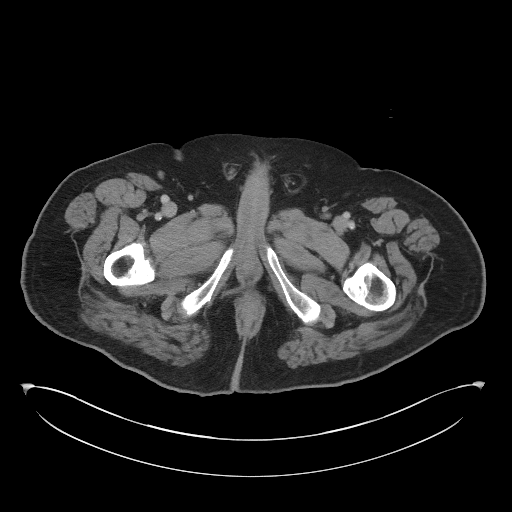
[im 25/113  soft-tissue]
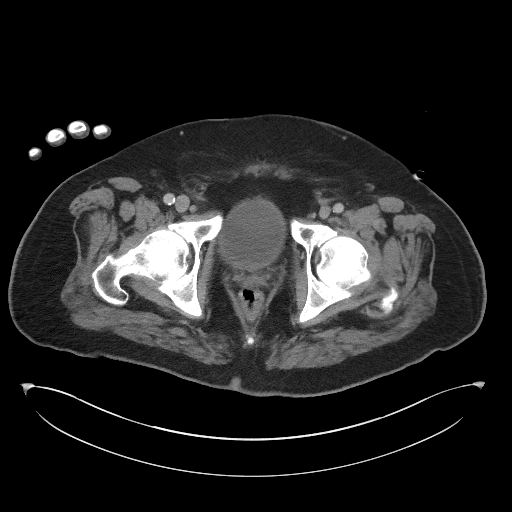
[im 32/113  soft-tissue]
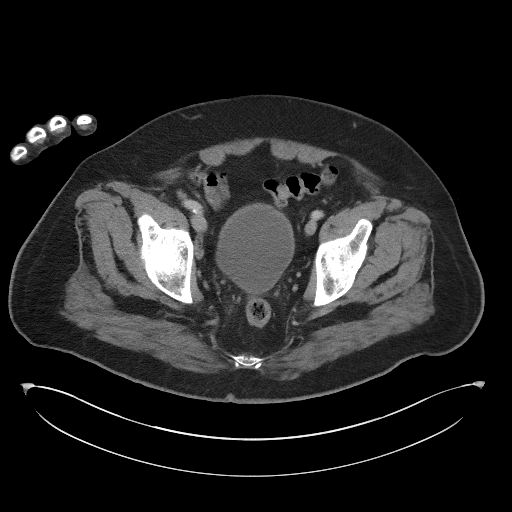
[im 38/113  soft-tissue]
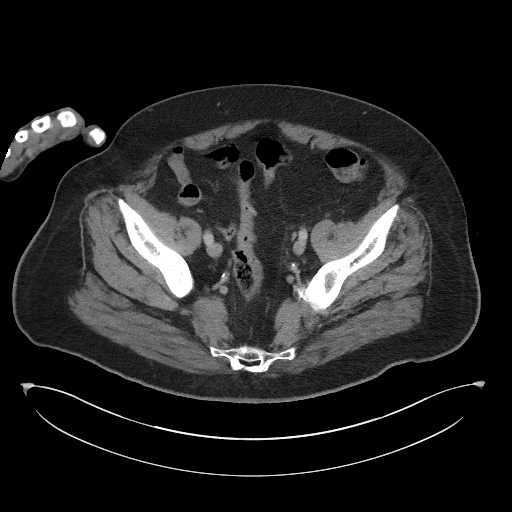
[im 50/113  soft-tissue]
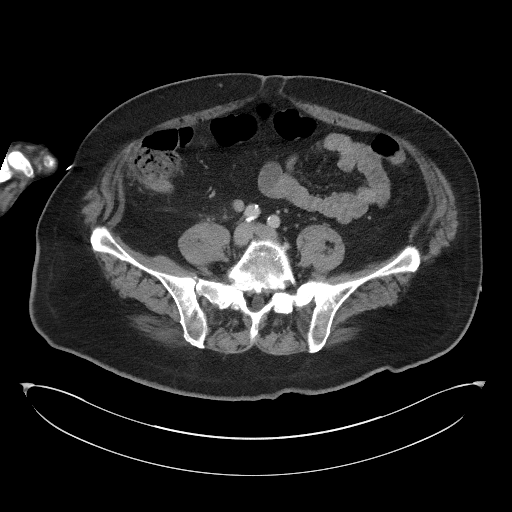
[im 57/113  soft-tissue]
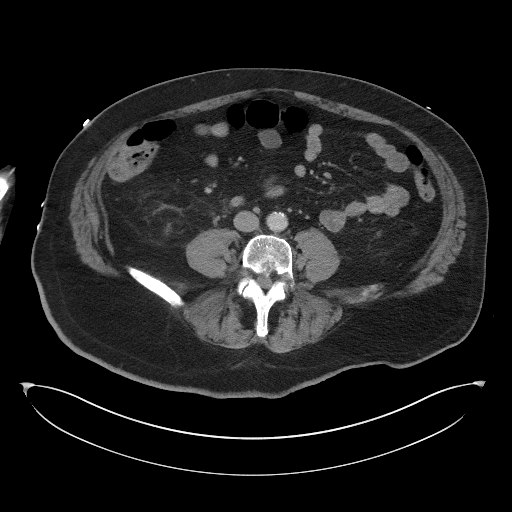
[im 63/113  soft-tissue]
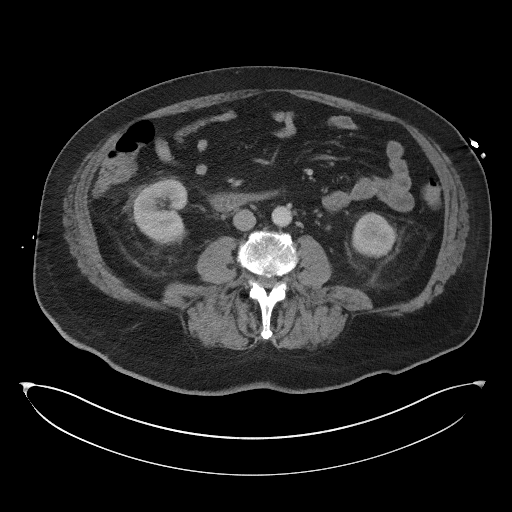
[im 75/113  soft-tissue]
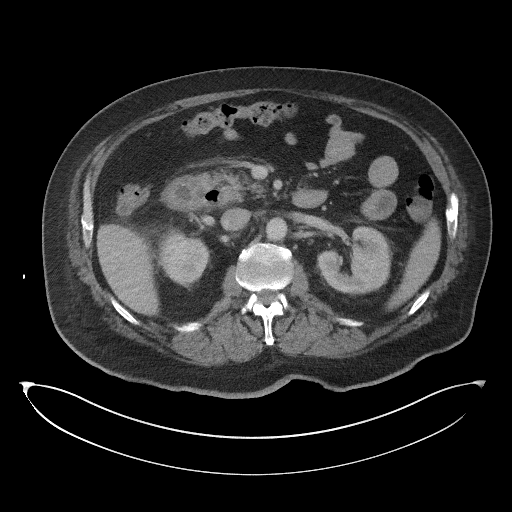
[im 75/113  bone]
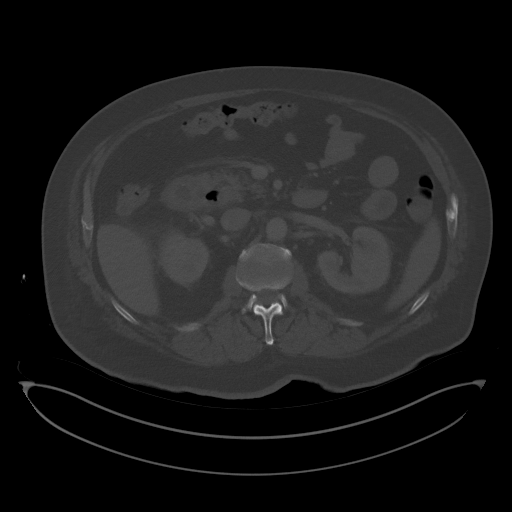
[im 81/113  soft-tissue]
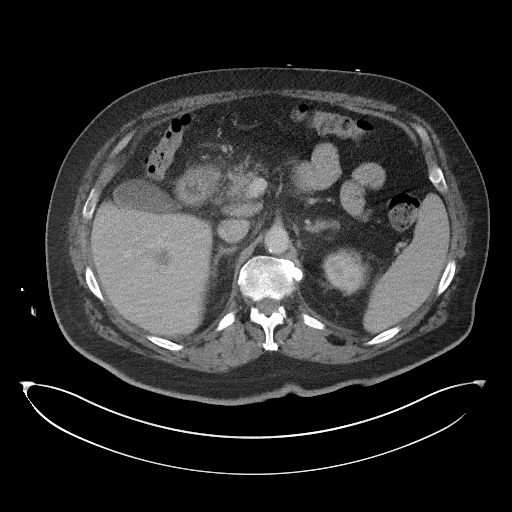
[im 88/113  soft-tissue]
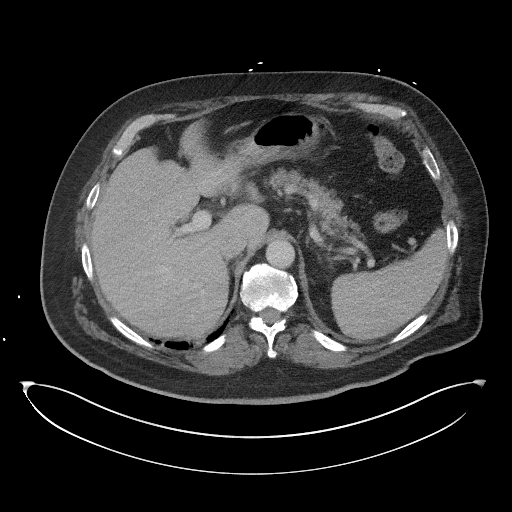
[im 100/113  soft-tissue]
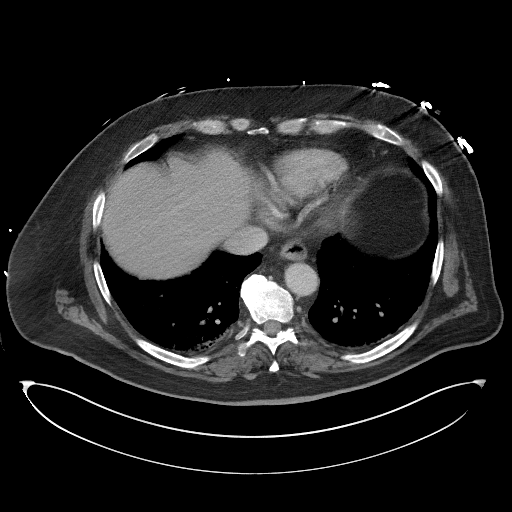
[im 106/113  soft-tissue]
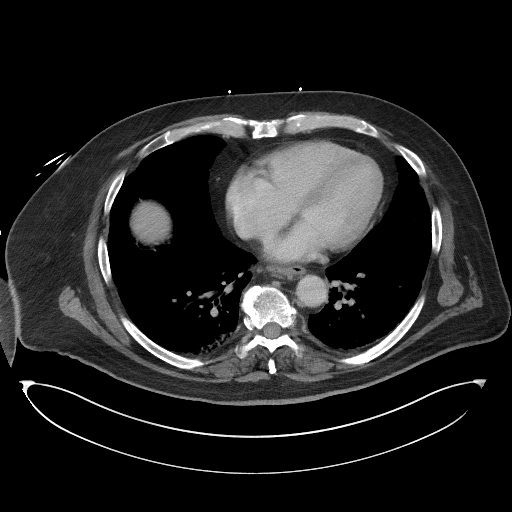

[Series 6: coronal st · coronal · 0.97mm/px · 3 of 109 slices shown]
[im 37/109  soft-tissue]
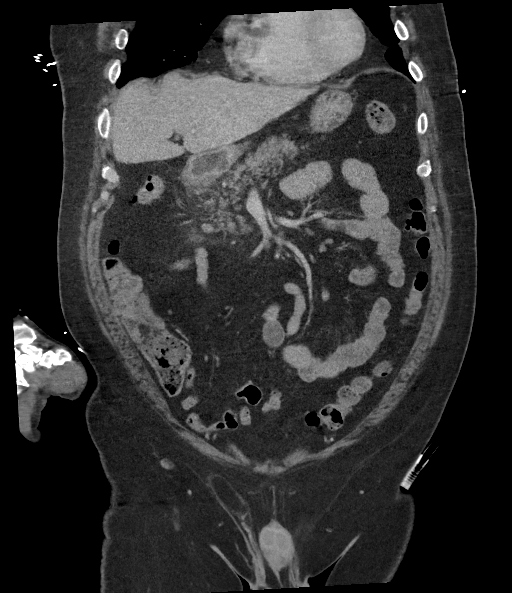
[im 49/109  soft-tissue]
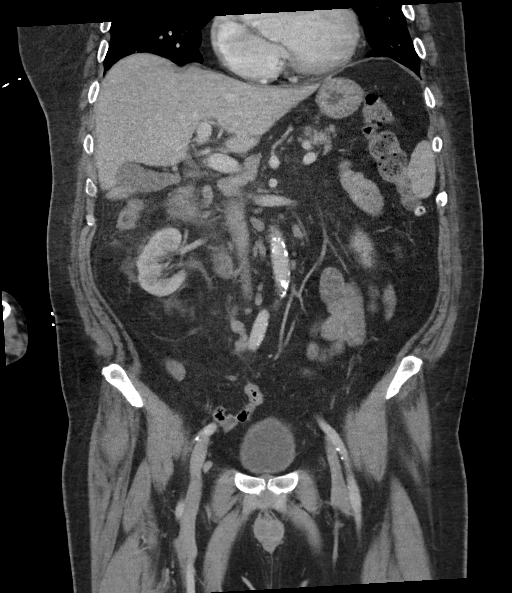
[im 61/109  soft-tissue]
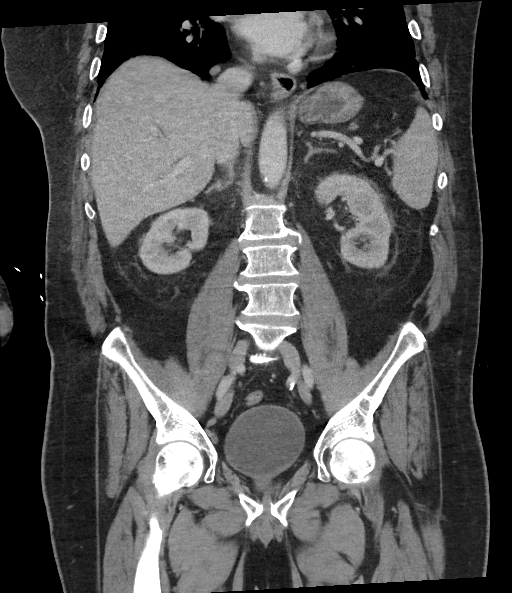

[16 of 46 positions shown; findings below may reference images not displayed]

FINDINGS: Lower chest: Mild bibasilar atelectasis.

Hepatobiliary: 2.8 cm cyst in segment 6.

Gallbladder is unremarkable. No intrahepatic or extrahepatic ductal
dilatation.

Pancreas: Within normal limits.

Spleen: Within normal limits.

Adrenals/Urinary Tract: Adrenal glands are within normal limits.

Kidneys are within normal limits.  No hydronephrosis.

Bladder is within normal limits.

Stomach/Bowel: Stomach is within normal limits.

Mild wall thickening involving the proximal duodenum with suspected
small duodenal diverticulum (coronal image 46).

No evidence of bowel obstruction.

Normal appendix (series 2/image 71).

No colonic wall thickening or inflammatory changes.

Vascular/Lymphatic: No evidence of abdominal aortic aneurysm.

Atherosclerotic calcifications of the abdominal aorta and branch
vessels.

No suspicious abdominopelvic lymphadenopathy.

Reproductive: Prostate is unremarkable.

Other: No abdominopelvic ascites.

Musculoskeletal: Degenerative changes of the visualized
thoracolumbar spine.
IMPRESSION: Mild wall thickening involving the proximal duodenum, raising the
possibility of duodenitis. Adjacent small duodenal diverticulum.

No evidence of bowel obstruction.  Normal appendix.

## 2021-11-02 IMAGING — MR MR FOOT*R* W/O CM
6 series · 40 of 40 positions shown · non-contrast
Comparison: 06/12/2021 radiographs and MRI from 06/07/2021

CLINICAL DATA: Swelling of the foot, diabetes. Recent (06/07/2021)
amputation of the right second toe at the diaphyseal level of the
proximal phalanx.

EXAM:
MRI OF THE RIGHT FOREFOOT WITHOUT CONTRAST
TECHNIQUE: Multiplanar, multisequence MR imaging of the right forefoot was
performed. No intravenous contrast was administered.

[Series 4: T1 · oblique · right · 3.0mm · 0.38mm/px · 8 of 45 slices shown (1 of 2)]
[im 1/45]
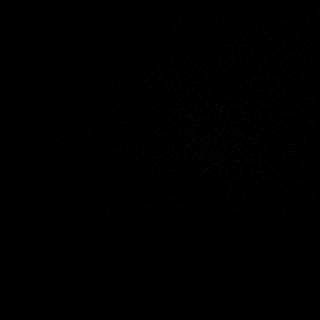
[im 7/45]
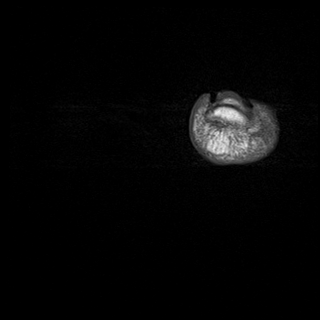
[im 13/45]
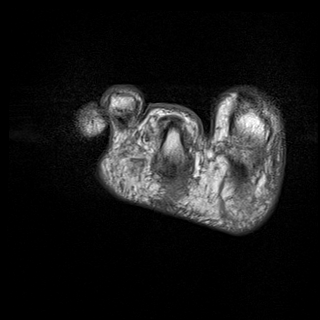
[im 19/45]
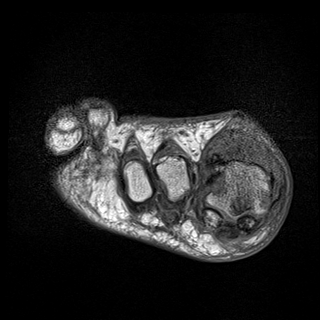
[im 26/45]
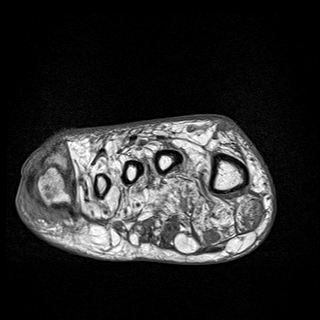
[im 32/45]
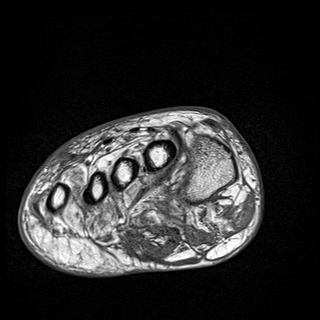
[im 38/45]
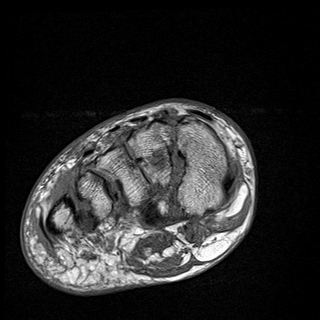
[im 45/45]
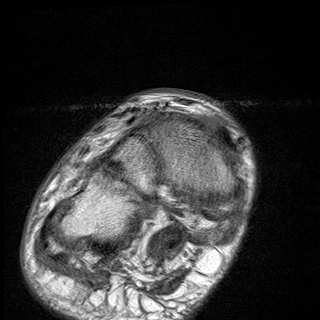

[Series 5: T2 · oblique · right · 3.0mm · 0.38mm/px · 9 of 45 slices shown (1 of 3)]
[im 1/45]
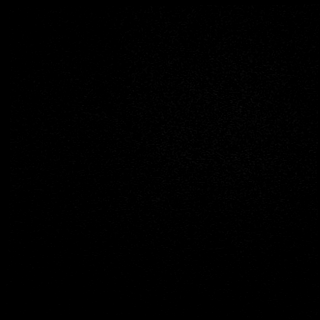
[im 6/45]
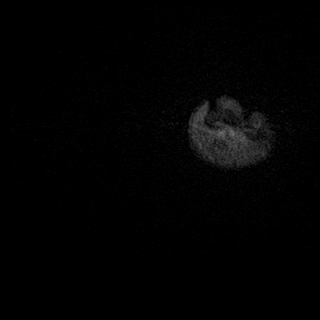
[im 12/45]
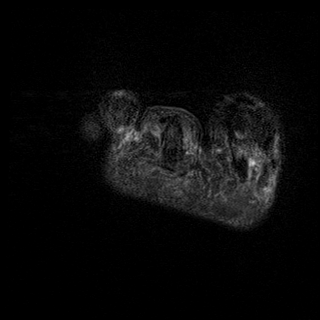
[im 17/45]
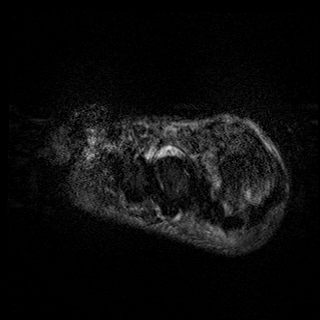
[im 23/45]
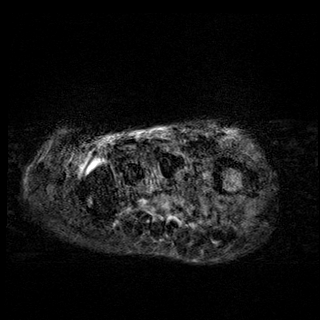
[im 28/45]
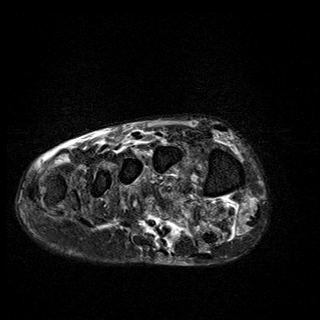
[im 34/45]
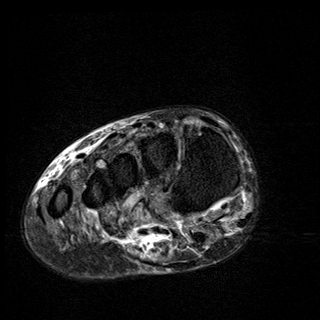
[im 39/45]
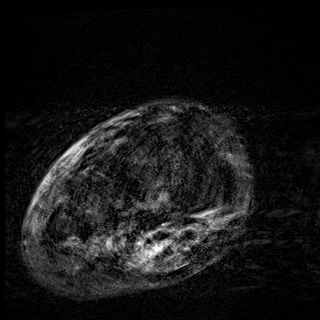
[im 45/45]
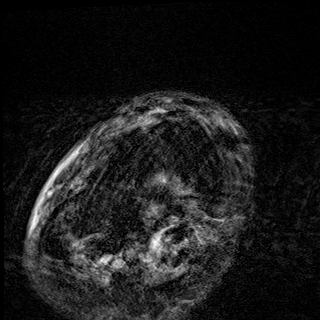

[Series 7: T1 · oblique · right · 3.0mm · 0.70mm/px · 4 of 20 slices shown (2 of 2)]
[im 1/20]
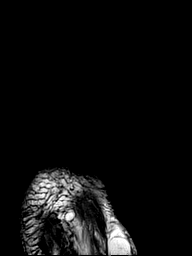
[im 7/20]
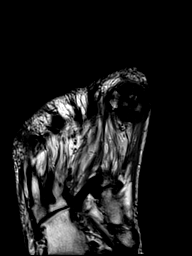
[im 13/20]
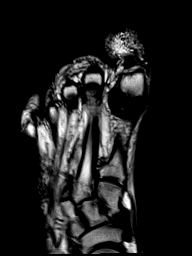
[im 20/20]
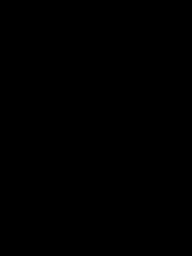

[Series 9: T2 · oblique · right · 3.0mm · 0.70mm/px · 4 of 19 slices shown (2 of 3)]
[im 1/19]
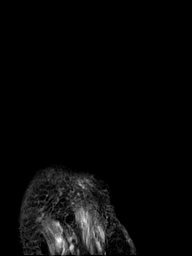
[im 7/19]
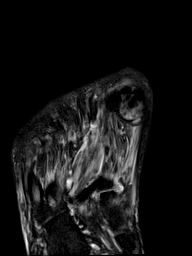
[im 13/19]
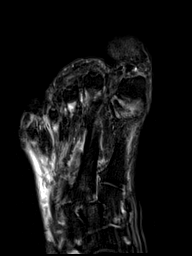
[im 19/19]
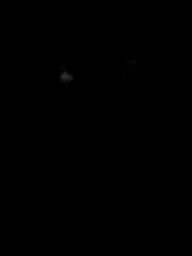

[Series 10: STIR · sagittal · right · 3.0mm · 0.62mm/px · 6 of 33 slices shown]
[im 1/33]
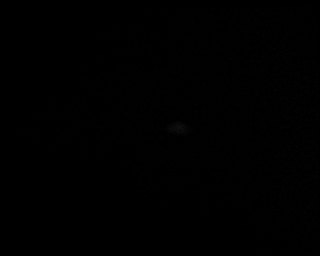
[im 7/33]
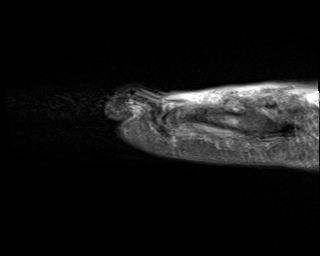
[im 13/33]
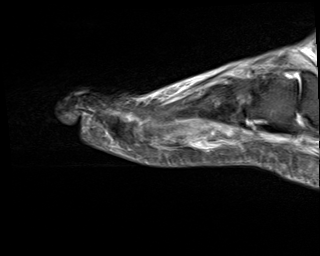
[im 20/33]
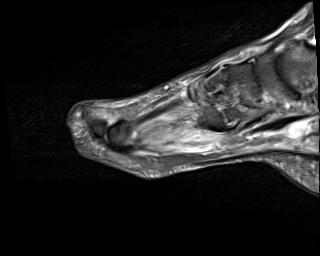
[im 26/33]
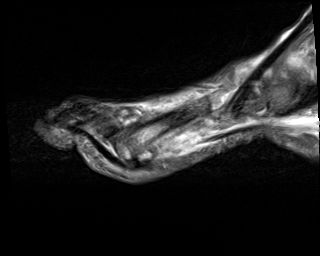
[im 33/33]
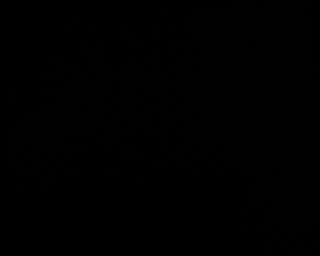

[Series 12: T2 · oblique · right · 3.0mm · 0.38mm/px · 9 of 45 slices shown (3 of 3)]
[im 1/45]
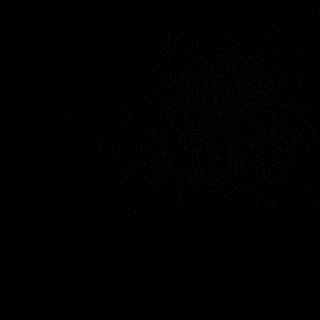
[im 6/45]
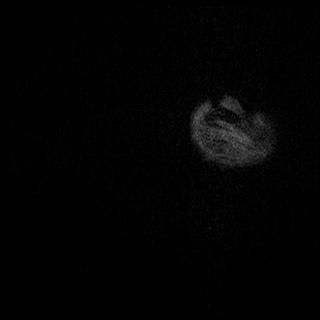
[im 12/45]
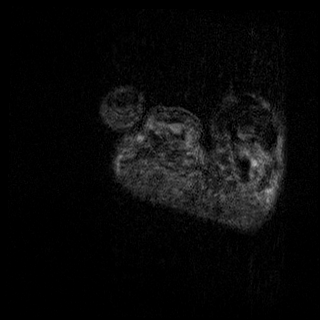
[im 17/45]
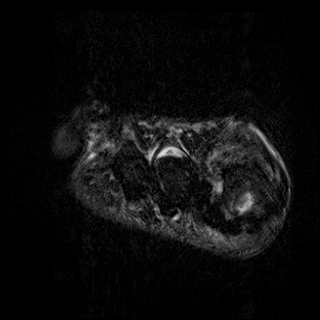
[im 23/45]
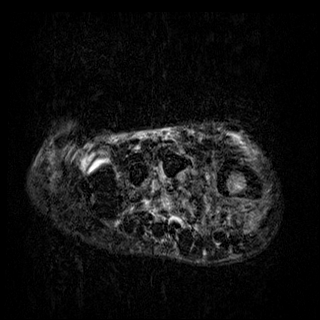
[im 28/45]
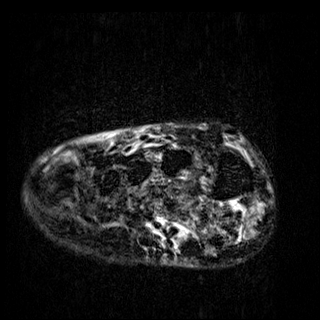
[im 34/45]
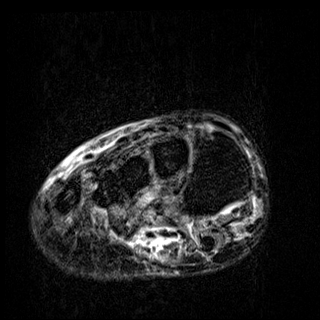
[im 39/45]
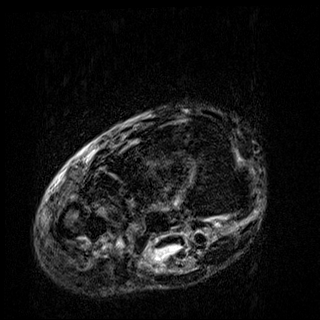
[im 45/45]
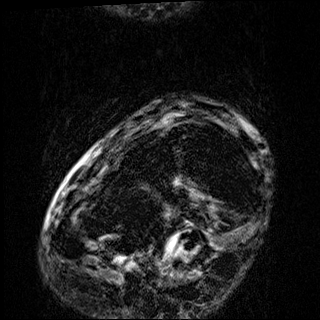

[40 of 40 positions shown; findings below may reference images not displayed]

FINDINGS: Despite efforts by the technologist and patient, prominent motion
artifact is present on today's exam and could not be eliminated.
This reduces exam sensitivity and specificity.

Bones/Joint/Cartilage

Amputation of the second toe at the level of the mid diaphysis of
the proximal phalanx. Trace fluid signal intensity just distal to
the bony margin on image 19 series 10 but no findings of active
osteomyelitis involving the remainder of the proximal phalanx.

Stable arthropathy between the middle cuneiform and the base of the
second metatarsal, with associated marrow edema and erosion or
subcortical cyst along the base of the second metatarsal on image 9
series 7.

We again demonstrate substantial erosions along the medial, lateral,
and distal articular margins of the first metatarsal head best
depicted on the T1 weighted images such as images 10 through 13 of
series 7. There is associated marrow edema in the left first
metatarsal head and distal shaft. Degenerative findings but no
substantial degree of edema in the proximal phalanx of the great
toe. Effusion of the first MTP joint is again noted particularly
dorsally where there a septated and somewhat heterogeneous effusion
on image 26 series 10 similar to prior. Mild sclerosis of the first
digit medial sesamoid. Overall these findings are not changed from
prior, with the erosions along the first metatarsal head potentially
from gout/[HOSPITAL] arthropathy or septic joint.

Small erosion or degenerative subcortical cyst in the head of the
fifth metatarsal, image 12 series 7, similar to prior. There is
low-level marrow edema in the proximal diaphysis of the fifth
metatarsal which is unchanged and probably from stress reaction.

Additional mild degenerative findings noted along the midfoot and
Chopart joint.

Ligaments

The Lisfranc ligament appears intact.

Muscles and Tendons

Flexor digitorum longus tenosynovitis and peritendinitis distally,
mildly increased from the 06/07/2021 exam. Regional muscular atrophy
with low-level accentuated T2 signal along the regional musculature
which is probably neuropathic.

Soft tissues

Mild dorsal subcutaneous edema along the forefoot.
IMPRESSION: 1. Interval amputation of the second toe at the level of the mid
diaphysis of the proximal phalanx, without evidence of osteomyelitis
involving the remaining portion of the proximal phalanx.
2. Stable appearance of substantial erosive arthropathy at the first
MTP joint especially involving the first metatarsal head, with
associated marrow edema in the metatarsal head and distal shaft.
Complex dorsal effusion. The appearance tends to favor gout
arthropathy, but as before, infection is not readily excluded. No
substantial change over the last 5 days.
3. Stable stress reaction in the fifth metatarsal, with low-level
marrow edema. Small erosion or degenerative subcortical cyst in the
head of the fifth metatarsal, stable.
4. Increased flexor digitorum longus tenosynovitis and
peritendinitis.
5. Mild dorsal subcutaneous edema along the forefoot, cellulitis is
not excluded.

## 2021-11-02 IMAGING — DX DG FOOT COMPLETE 3+V*R*
3 series · 3 of 3 positions shown · non-contrast
Comparison: None.

CLINICAL DATA: Right toe amputation on [DATE] for osteo, weakness

EXAM:
RIGHT FOOT COMPLETE - 3+ VIEW

[foot ap]
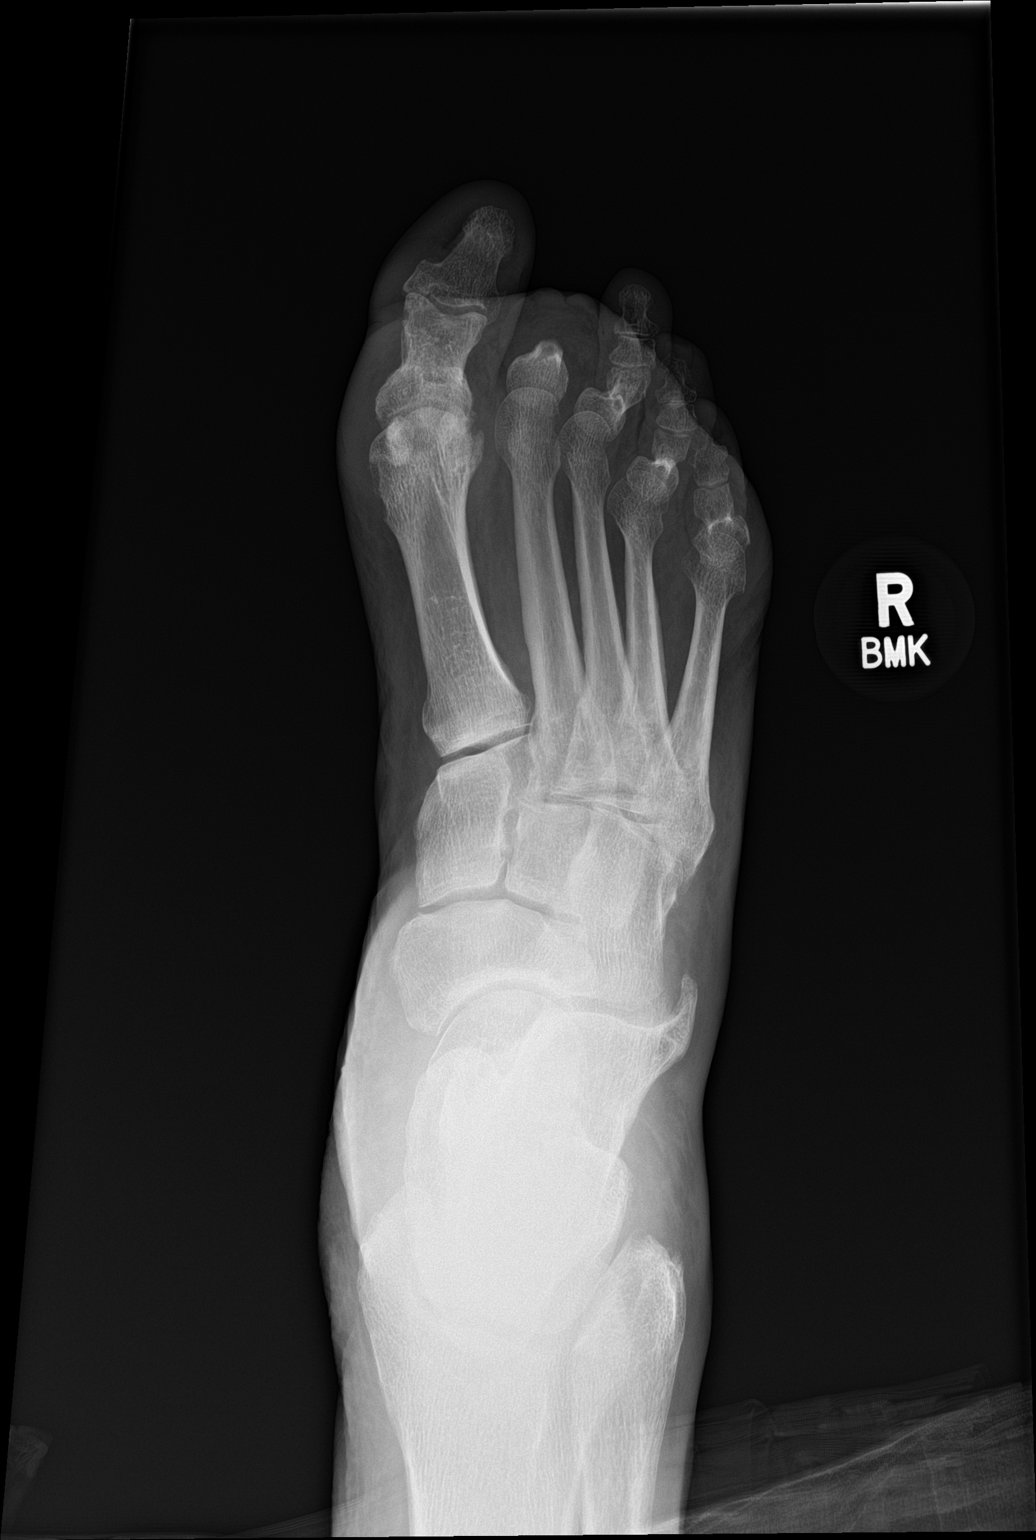

[foot obl]
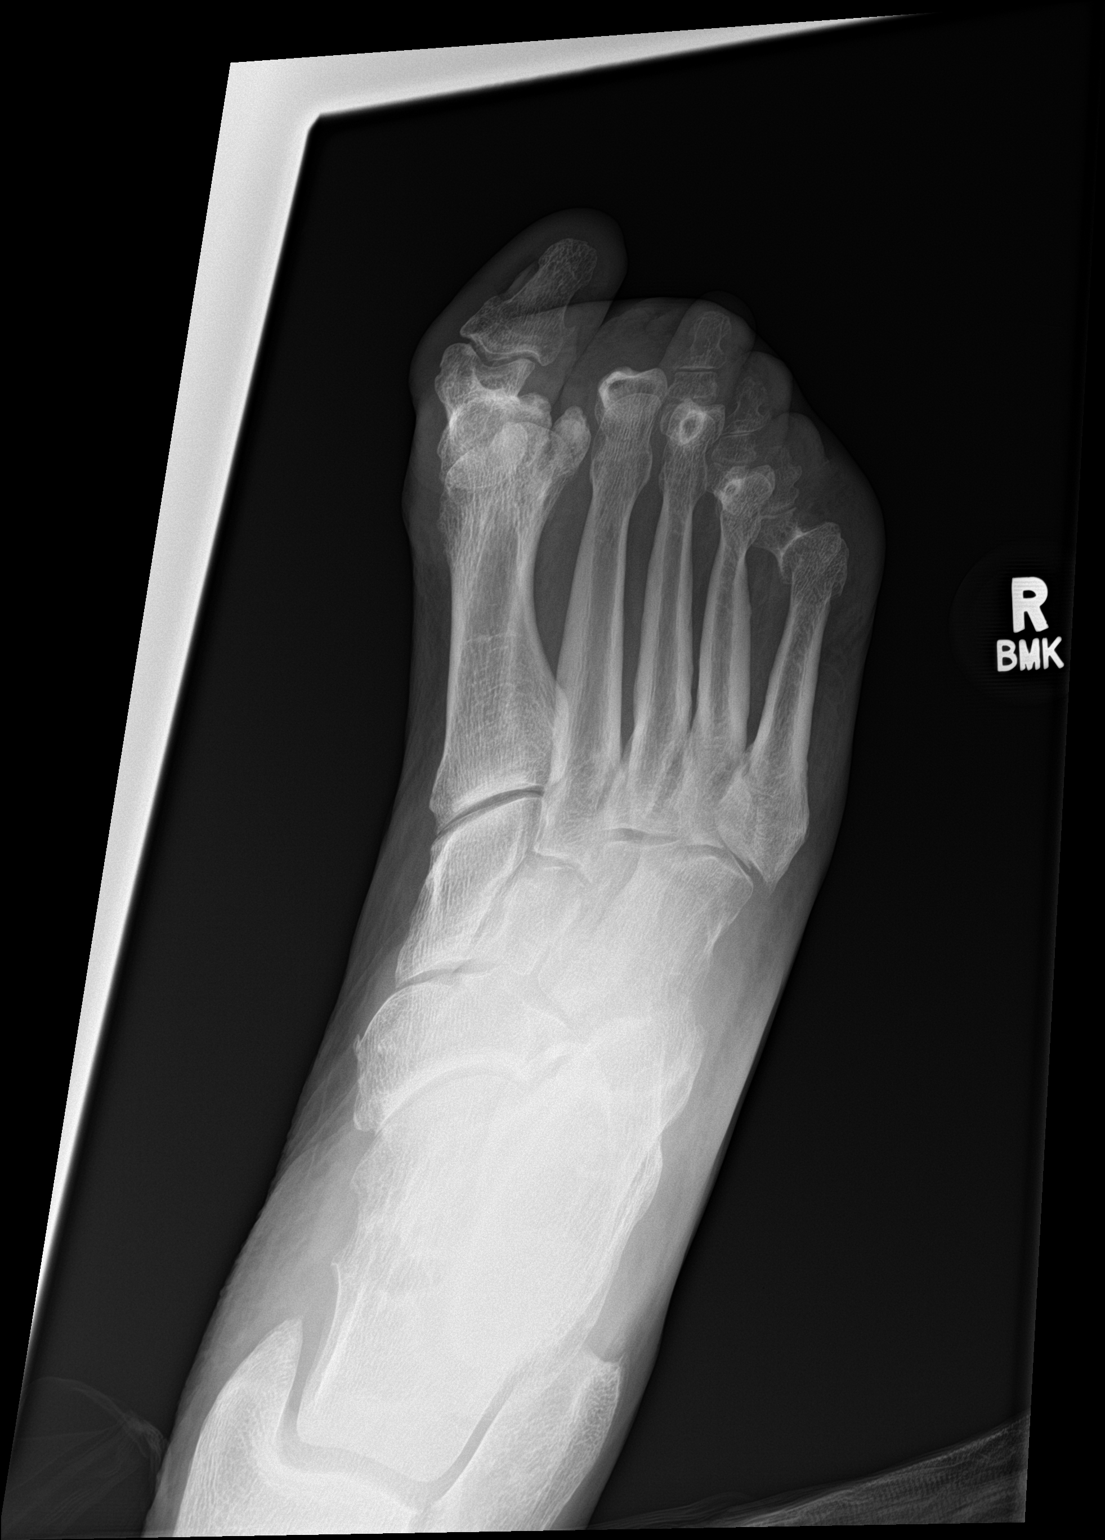

[foot lat]
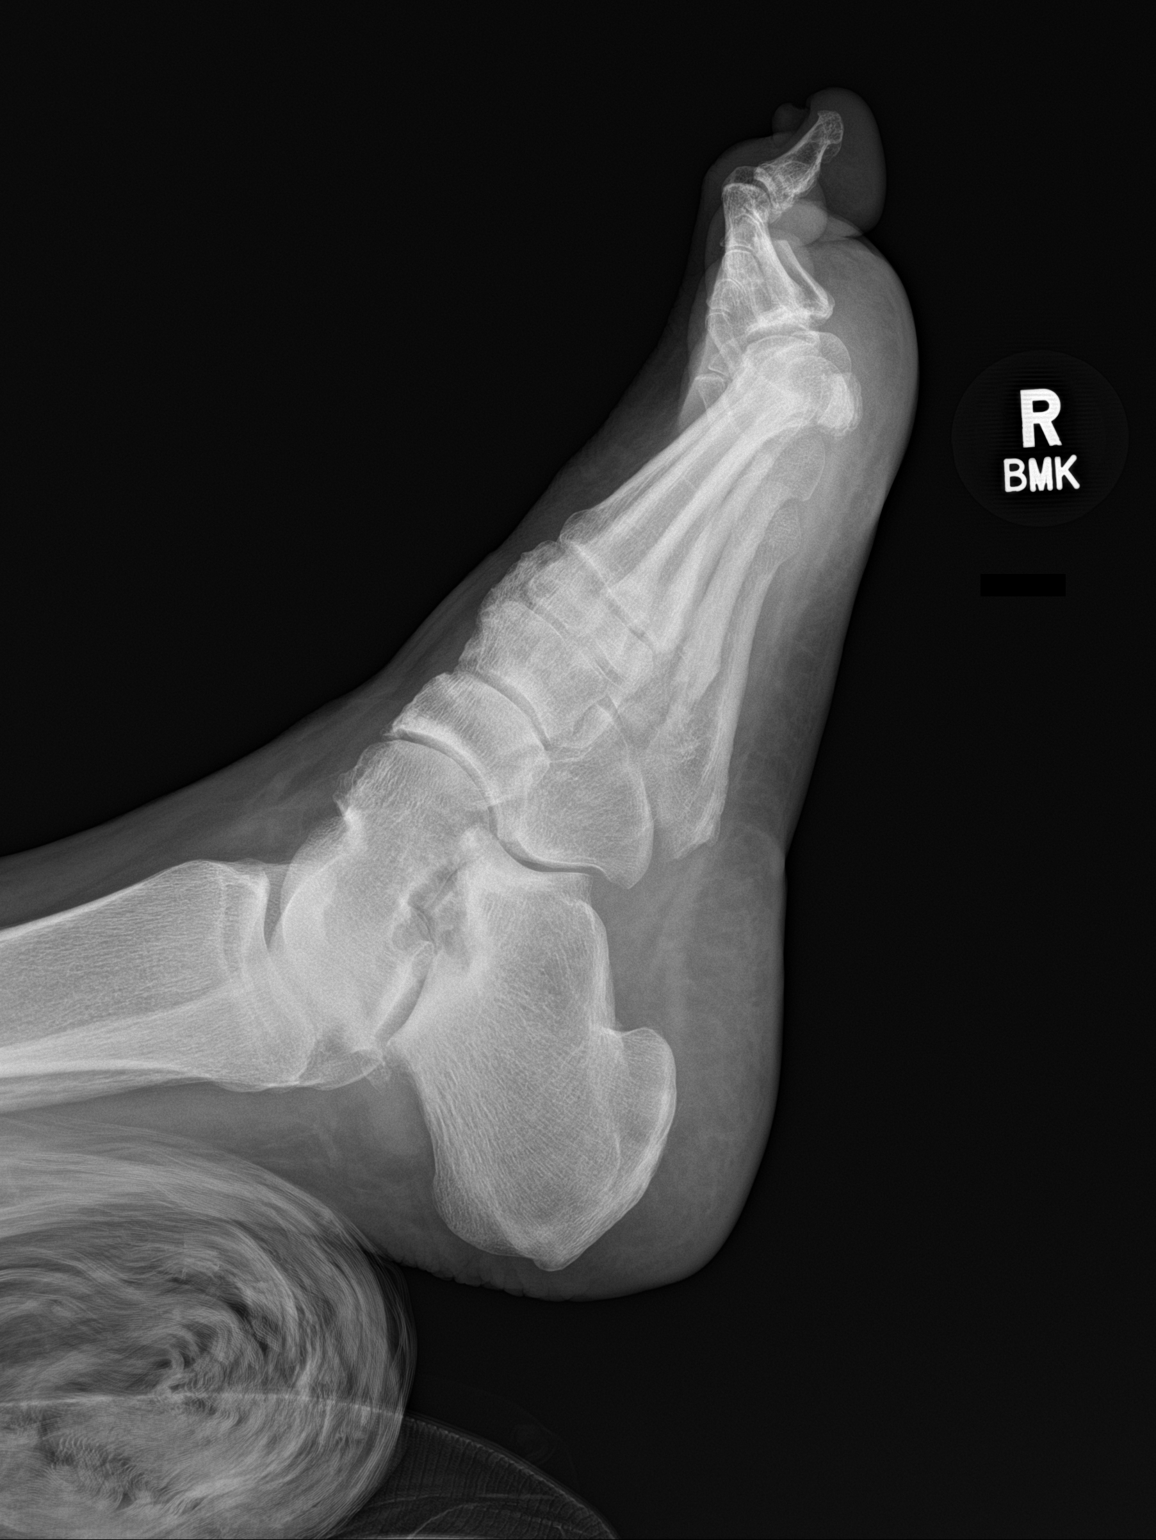

[3 of 3 positions shown; findings below may reference images not displayed]

FINDINGS: No fracture or dislocation is seen.

Status post amputation of the 2nd digit at the level of the base of
the proximal phalanx.

Mild soft tissue swelling.

The joint spaces are preserved.
IMPRESSION: Status post 2nd digit amputation.

No acute findings.

## 2022-01-07 ENCOUNTER — Ambulatory Visit: Payer: Medicare PPO | Admitting: Podiatry

## 2022-02-16 DIAGNOSIS — Z89512 Acquired absence of left leg below knee: Secondary | ICD-10-CM | POA: Diagnosis not present

## 2022-03-16 DIAGNOSIS — I10 Essential (primary) hypertension: Secondary | ICD-10-CM | POA: Diagnosis not present

## 2022-03-16 DIAGNOSIS — E6609 Other obesity due to excess calories: Secondary | ICD-10-CM | POA: Diagnosis not present

## 2022-03-16 DIAGNOSIS — Z1159 Encounter for screening for other viral diseases: Secondary | ICD-10-CM | POA: Diagnosis not present

## 2022-03-16 DIAGNOSIS — N1831 Chronic kidney disease, stage 3a: Secondary | ICD-10-CM | POA: Diagnosis not present

## 2022-03-16 DIAGNOSIS — Z125 Encounter for screening for malignant neoplasm of prostate: Secondary | ICD-10-CM | POA: Diagnosis not present

## 2022-03-16 DIAGNOSIS — Z6830 Body mass index (BMI) 30.0-30.9, adult: Secondary | ICD-10-CM | POA: Diagnosis not present

## 2022-03-18 DIAGNOSIS — Z0000 Encounter for general adult medical examination without abnormal findings: Secondary | ICD-10-CM | POA: Diagnosis not present

## 2022-03-18 DIAGNOSIS — Z1389 Encounter for screening for other disorder: Secondary | ICD-10-CM | POA: Diagnosis not present

## 2022-03-18 DIAGNOSIS — Z862 Personal history of diseases of the blood and blood-forming organs and certain disorders involving the immune mechanism: Secondary | ICD-10-CM | POA: Diagnosis not present

## 2022-03-18 DIAGNOSIS — E875 Hyperkalemia: Secondary | ICD-10-CM | POA: Diagnosis not present

## 2022-05-05 ENCOUNTER — Ambulatory Visit: Payer: Self-pay

## 2022-05-05 NOTE — Patient Outreach (Signed)
  Care Coordination   Initial Visit Note   05/05/2022 Name: Daniel Leon MRN: [id number] DOB: 1951-03-26  Daniel Leon is a 71 y.o. year old male who sees Marisue Ivan, MD for primary care. I spoke with  Marigene Ehlers by phone today.  What matters to the patients health and wellness today?  The patient feels he is stable and doing well. Did ask for the [facility] number in the event of new changes in his health and well being    Goals Addressed             This Visit's Progress    COMPLETED: RNCM: Effective Management of health and well being       Care Coordination Interventions: Evaluation of current treatment plan related to chronic health conditions and health and well being and patient's adherence to plan as established by provider Advised patient to call the office for changes in conditions, new questions, or concerns related to his chronic conditions and effective management  Provided education to patient re: the care coordination program and the availability of the RNCM to assist for new questions or concerns. The patient is aware of resources and thanked the [facility] for calling Discussed plans with patient for ongoing care management follow up and provided patient with direct contact information for care management team Advised patient to discuss changes in his chronic conditions with provider Screening for signs and symptoms of depression related to chronic disease state  Assessed social determinant of health barriers The patient ask the RNCM to send contact information by email to the patients email address. Email validated and information sent to the patient by secure email on how to reach the [facility].            SDOH assessments and interventions completed:  Yes  SDOH Interventions Today    Flowsheet Row Most Recent Value  SDOH Interventions   Housing Interventions Intervention Not Indicated  Utilities Interventions Intervention Not Indicated         Care Coordination Interventions Activated:  Yes  Care Coordination Interventions:  Yes, provided   Follow up plan: No further intervention required.   Encounter Outcome:  Pt. Visit Completed   Alto Denver RN, MSN, CCM Community Care Coordinator [facility]  [facility] Mobile: [phone]

## 2022-05-05 NOTE — Patient Instructions (Signed)
Visit Information  Thank you for taking time to visit with me today. Please don't hesitate to contact me if I can be of assistance to you.   Following are the goals we discussed today:   Goals Addressed             This Visit's Progress    COMPLETED: RNCM: Effective Management of health and well being       Care Coordination Interventions: Evaluation of current treatment plan related to chronic health conditions and health and well being and patient's adherence to plan as established by provider Advised patient to call the office for changes in conditions, new questions, or concerns related to his chronic conditions and effective management  Provided education to patient re: the care coordination program and the availability of the RNCM to assist for new questions or concerns. The patient is aware of resources and thanked the [facility] for calling Discussed plans with patient for ongoing care management follow up and provided patient with direct contact information for care management team Advised patient to discuss changes in his chronic conditions with provider Screening for signs and symptoms of depression related to chronic disease state  Assessed social determinant of health barriers The patient ask the RNCM to send contact information by email to the patients email address. Email validated and information sent to the patient by secure email on how to reach the [facility].              Please call the care guide team at [phone] if you need to schedule an appointment.   If you are experiencing a Mental Health or Behavioral Health Crisis or need someone to talk to, please call the Suicide and Crisis Lifeline: 988 call the [place] National Suicide Prevention Lifeline: (501)624-1244 or TTY: 715-602-6294 TTY 708-251-7194) to talk to a trained counselor call 1-800-273-TALK (toll free, 24 hour hotline)  Patient verbalizes understanding of instructions and care plan provided today and  agrees to view in MyChart. Active MyChart status and patient understanding of how to access instructions and care plan via MyChart confirmed with patient.     No further follow up required: the patient has the [facility] number to call for changes or new needs. Alto Denver RN, MSN, CCM Community Care Coordinator [facility]  [facility] Mobile: [phone]

## 2022-07-29 ENCOUNTER — Ambulatory Visit: Payer: Medicare PPO | Attending: Cardiology

## 2022-07-29 DIAGNOSIS — I77810 Thoracic aortic ectasia: Secondary | ICD-10-CM | POA: Diagnosis not present

## 2022-07-29 DIAGNOSIS — I517 Cardiomegaly: Secondary | ICD-10-CM

## 2022-07-29 DIAGNOSIS — I5030 Unspecified diastolic (congestive) heart failure: Secondary | ICD-10-CM | POA: Diagnosis not present

## 2022-07-29 LAB — ECHOCARDIOGRAM COMPLETE
AR max vel: 3.8 cm2
AV Area VTI: 3.63 cm2
AV Area mean vel: 3.63 cm2
AV Mean grad: 2 mmHg
AV Peak grad: 4.1 mmHg
Ao pk vel: 1.01 m/s
Area-P 1/2: 2.29 cm2
S' Lateral: 2.8 cm

## 2022-07-29 MED ORDER — PERFLUTREN LIPID MICROSPHERE
1.0000 mL | INTRAVENOUS | Status: AC | PRN
  Administered 2022-07-29: 2 mL via INTRAVENOUS

## 2022-09-13 DIAGNOSIS — E781 Pure hyperglyceridemia: Secondary | ICD-10-CM | POA: Diagnosis not present

## 2022-09-13 DIAGNOSIS — Z862 Personal history of diseases of the blood and blood-forming organs and certain disorders involving the immune mechanism: Secondary | ICD-10-CM | POA: Diagnosis not present

## 2022-09-13 DIAGNOSIS — I10 Essential (primary) hypertension: Secondary | ICD-10-CM | POA: Diagnosis not present

## 2022-09-20 DIAGNOSIS — E6609 Other obesity due to excess calories: Secondary | ICD-10-CM | POA: Diagnosis not present

## 2022-09-20 DIAGNOSIS — Z89512 Acquired absence of left leg below knee: Secondary | ICD-10-CM | POA: Diagnosis not present

## 2022-09-20 DIAGNOSIS — I129 Hypertensive chronic kidney disease with stage 1 through stage 4 chronic kidney disease, or unspecified chronic kidney disease: Secondary | ICD-10-CM | POA: Diagnosis not present

## 2022-09-20 DIAGNOSIS — Z862 Personal history of diseases of the blood and blood-forming organs and certain disorders involving the immune mechanism: Secondary | ICD-10-CM | POA: Diagnosis not present

## 2022-09-20 DIAGNOSIS — N183 Chronic kidney disease, stage 3 unspecified: Secondary | ICD-10-CM | POA: Diagnosis not present

## 2022-09-20 DIAGNOSIS — G5621 Lesion of ulnar nerve, right upper limb: Secondary | ICD-10-CM | POA: Diagnosis not present

## 2022-09-20 DIAGNOSIS — Z9189 Other specified personal risk factors, not elsewhere classified: Secondary | ICD-10-CM | POA: Diagnosis not present

## 2022-09-20 DIAGNOSIS — Z6831 Body mass index (BMI) 31.0-31.9, adult: Secondary | ICD-10-CM | POA: Diagnosis not present

## 2022-09-21 ENCOUNTER — Other Ambulatory Visit (HOSPITAL_BASED_OUTPATIENT_CLINIC_OR_DEPARTMENT_OTHER): Payer: Self-pay | Admitting: Family Medicine

## 2022-09-21 DIAGNOSIS — Z9189 Other specified personal risk factors, not elsewhere classified: Secondary | ICD-10-CM

## 2022-10-01 ENCOUNTER — Ambulatory Visit
Admission: RE | Admit: 2022-10-01 | Discharge: 2022-10-01 | Disposition: A | Discharge status: Home / Self Care | Payer: Medicare PPO | Source: Ambulatory Visit | Attending: Family Medicine | Admitting: Family Medicine

## 2022-10-01 DIAGNOSIS — Z9189 Other specified personal risk factors, not elsewhere classified: Secondary | ICD-10-CM

## 2022-10-05 DIAGNOSIS — I700 Atherosclerosis of aorta: Secondary | ICD-10-CM | POA: Diagnosis not present

## 2022-10-05 DIAGNOSIS — J439 Emphysema, unspecified: Secondary | ICD-10-CM | POA: Diagnosis not present

## 2022-10-05 DIAGNOSIS — I2510 Atherosclerotic heart disease of native coronary artery without angina pectoris: Secondary | ICD-10-CM | POA: Diagnosis not present

## 2022-11-04 DIAGNOSIS — Z9189 Other specified personal risk factors, not elsewhere classified: Secondary | ICD-10-CM | POA: Diagnosis not present

## 2022-11-04 DIAGNOSIS — I700 Atherosclerosis of aorta: Secondary | ICD-10-CM | POA: Diagnosis not present

## 2022-11-04 DIAGNOSIS — I10 Essential (primary) hypertension: Secondary | ICD-10-CM | POA: Diagnosis not present

## 2022-11-04 DIAGNOSIS — I2510 Atherosclerotic heart disease of native coronary artery without angina pectoris: Secondary | ICD-10-CM | POA: Diagnosis not present

## 2022-11-24 DIAGNOSIS — Z9189 Other specified personal risk factors, not elsewhere classified: Secondary | ICD-10-CM | POA: Diagnosis not present

## 2022-11-24 DIAGNOSIS — Z862 Personal history of diseases of the blood and blood-forming organs and certain disorders involving the immune mechanism: Secondary | ICD-10-CM | POA: Diagnosis not present

## 2022-11-24 DIAGNOSIS — I10 Essential (primary) hypertension: Secondary | ICD-10-CM | POA: Diagnosis not present

## 2022-12-01 DIAGNOSIS — I700 Atherosclerosis of aorta: Secondary | ICD-10-CM | POA: Diagnosis not present

## 2022-12-01 DIAGNOSIS — I129 Hypertensive chronic kidney disease with stage 1 through stage 4 chronic kidney disease, or unspecified chronic kidney disease: Secondary | ICD-10-CM | POA: Diagnosis not present

## 2022-12-01 DIAGNOSIS — N1831 Chronic kidney disease, stage 3a: Secondary | ICD-10-CM | POA: Diagnosis not present

## 2022-12-01 DIAGNOSIS — Z862 Personal history of diseases of the blood and blood-forming organs and certain disorders involving the immune mechanism: Secondary | ICD-10-CM | POA: Diagnosis not present

## 2022-12-01 DIAGNOSIS — I2510 Atherosclerotic heart disease of native coronary artery without angina pectoris: Secondary | ICD-10-CM | POA: Diagnosis not present

## 2023-11-01 DIAGNOSIS — I10 Essential (primary) hypertension: Secondary | ICD-10-CM | POA: Diagnosis not present

## 2023-11-01 DIAGNOSIS — Z9189 Other specified personal risk factors, not elsewhere classified: Secondary | ICD-10-CM | POA: Diagnosis not present

## 2023-11-01 DIAGNOSIS — I2510 Atherosclerotic heart disease of native coronary artery without angina pectoris: Secondary | ICD-10-CM | POA: Diagnosis not present

## 2023-11-01 DIAGNOSIS — I700 Atherosclerosis of aorta: Secondary | ICD-10-CM | POA: Diagnosis not present

## 2023-11-22 DIAGNOSIS — Z862 Personal history of diseases of the blood and blood-forming organs and certain disorders involving the immune mechanism: Secondary | ICD-10-CM | POA: Diagnosis not present

## 2023-11-22 DIAGNOSIS — I2510 Atherosclerotic heart disease of native coronary artery without angina pectoris: Secondary | ICD-10-CM | POA: Diagnosis not present

## 2023-12-05 DIAGNOSIS — I10 Essential (primary) hypertension: Secondary | ICD-10-CM | POA: Diagnosis not present

## 2023-12-05 DIAGNOSIS — D649 Anemia, unspecified: Secondary | ICD-10-CM | POA: Diagnosis not present

## 2023-12-05 DIAGNOSIS — Z899 Acquired absence of limb, unspecified: Secondary | ICD-10-CM | POA: Diagnosis not present

## 2023-12-05 DIAGNOSIS — I2510 Atherosclerotic heart disease of native coronary artery without angina pectoris: Secondary | ICD-10-CM | POA: Diagnosis not present

## 2024-05-08 DIAGNOSIS — I700 Atherosclerosis of aorta: Secondary | ICD-10-CM | POA: Diagnosis not present

## 2024-05-08 DIAGNOSIS — I2510 Atherosclerotic heart disease of native coronary artery without angina pectoris: Secondary | ICD-10-CM | POA: Diagnosis not present

## 2024-05-08 DIAGNOSIS — I10 Essential (primary) hypertension: Secondary | ICD-10-CM | POA: Diagnosis not present

## 2024-05-29 DIAGNOSIS — I700 Atherosclerosis of aorta: Secondary | ICD-10-CM | POA: Diagnosis not present

## 2024-05-29 DIAGNOSIS — I2510 Atherosclerotic heart disease of native coronary artery without angina pectoris: Secondary | ICD-10-CM | POA: Diagnosis not present

## 2024-05-29 DIAGNOSIS — I10 Essential (primary) hypertension: Secondary | ICD-10-CM | POA: Diagnosis not present

## 2024-05-29 DIAGNOSIS — Z862 Personal history of diseases of the blood and blood-forming organs and certain disorders involving the immune mechanism: Secondary | ICD-10-CM | POA: Diagnosis not present

## 2024-05-29 DIAGNOSIS — Z130 Encounter for screening for diseases of the blood and blood-forming organs and certain disorders involving the immune mechanism: Secondary | ICD-10-CM | POA: Diagnosis not present

## 2024-06-05 DIAGNOSIS — Z1331 Encounter for screening for depression: Secondary | ICD-10-CM | POA: Diagnosis not present

## 2024-06-05 DIAGNOSIS — Z89512 Acquired absence of left leg below knee: Secondary | ICD-10-CM | POA: Diagnosis not present

## 2024-06-05 DIAGNOSIS — Z0000 Encounter for general adult medical examination without abnormal findings: Secondary | ICD-10-CM | POA: Diagnosis not present

## 2024-06-05 DIAGNOSIS — D649 Anemia, unspecified: Secondary | ICD-10-CM | POA: Diagnosis not present

## 2024-06-27 DIAGNOSIS — Z1211 Encounter for screening for malignant neoplasm of colon: Secondary | ICD-10-CM | POA: Diagnosis not present
